# Patient Record
Sex: Female | Born: 1955
Health system: Southern US, Community
[De-identification: ages and names within clinical notes are randomized; demographics above are authoritative.]

## PROBLEM LIST (undated history)

## (undated) DIAGNOSIS — M7660 Achilles tendinitis, unspecified leg: Secondary | ICD-10-CM

## (undated) DIAGNOSIS — I10 Essential (primary) hypertension: Secondary | ICD-10-CM

## (undated) DIAGNOSIS — J019 Acute sinusitis, unspecified: Secondary | ICD-10-CM

## (undated) DIAGNOSIS — M7710 Lateral epicondylitis, unspecified elbow: Secondary | ICD-10-CM

## (undated) DIAGNOSIS — E0590 Thyrotoxicosis, unspecified without thyrotoxic crisis or storm: Secondary | ICD-10-CM

## (undated) DIAGNOSIS — E739 Lactose intolerance, unspecified: Secondary | ICD-10-CM

## (undated) DIAGNOSIS — D219 Benign neoplasm of connective and other soft tissue, unspecified: Secondary | ICD-10-CM

## (undated) DIAGNOSIS — E119 Type 2 diabetes mellitus without complications: Secondary | ICD-10-CM

## (undated) DIAGNOSIS — E785 Hyperlipidemia, unspecified: Secondary | ICD-10-CM

## (undated) DIAGNOSIS — K219 Gastro-esophageal reflux disease without esophagitis: Secondary | ICD-10-CM

## (undated) DIAGNOSIS — F17200 Nicotine dependence, unspecified, uncomplicated: Secondary | ICD-10-CM

## (undated) DIAGNOSIS — N6001 Solitary cyst of right breast: Secondary | ICD-10-CM

## (undated) DIAGNOSIS — M25529 Pain in unspecified elbow: Secondary | ICD-10-CM

## (undated) DIAGNOSIS — G5600 Carpal tunnel syndrome, unspecified upper limb: Secondary | ICD-10-CM

## (undated) DIAGNOSIS — J309 Allergic rhinitis, unspecified: Secondary | ICD-10-CM

## (undated) DIAGNOSIS — Z8742 Personal history of other diseases of the female genital tract: Secondary | ICD-10-CM

## (undated) DIAGNOSIS — J302 Other seasonal allergic rhinitis: Secondary | ICD-10-CM

## (undated) HISTORY — DX: Solitary cyst of right breast: N60.01

## (undated) HISTORY — DX: Type 2 diabetes mellitus without complications: E11.9

## (undated) HISTORY — DX: Nicotine dependence, unspecified, uncomplicated: F17.200

## (undated) HISTORY — DX: Acute sinusitis, unspecified: J01.90

## (undated) HISTORY — DX: Lateral epicondylitis, unspecified elbow: M77.10

## (undated) HISTORY — DX: Achilles tendinitis, unspecified leg: M76.60

## (undated) HISTORY — PX: TONSILLECTOMY: SUR1361

## (undated) HISTORY — DX: Lactose intolerance, unspecified: E73.9

## (undated) HISTORY — DX: Allergic rhinitis, unspecified: J30.9

## (undated) HISTORY — DX: Benign neoplasm of connective and other soft tissue, unspecified: D21.9

## (undated) HISTORY — DX: Gastro-esophageal reflux disease without esophagitis: K21.9

## (undated) HISTORY — DX: Other seasonal allergic rhinitis: J30.2

## (undated) HISTORY — DX: Carpal tunnel syndrome, unspecified upper limb: G56.00

## (undated) HISTORY — DX: Pain in unspecified elbow: M25.529

## (undated) HISTORY — DX: Personal history of other diseases of the female genital tract: Z87.42

## (undated) HISTORY — DX: Hyperlipidemia, unspecified: E78.5

## (undated) HISTORY — DX: Thyrotoxicosis, unspecified without thyrotoxic crisis or storm: E05.90

## (undated) HISTORY — DX: Essential (primary) hypertension: I10

---

## 2000-01-09 ENCOUNTER — Other Ambulatory Visit: Admission: RE | Admit: 2000-01-09 | Discharge: 2000-01-09 | Payer: Self-pay | Admitting: *Deleted

## 2000-01-23 ENCOUNTER — Ambulatory Visit (HOSPITAL_COMMUNITY): Admission: RE | Admit: 2000-01-23 | Discharge: 2000-01-23 | Payer: Self-pay | Admitting: Obstetrics & Gynecology

## 2000-01-23 ENCOUNTER — Encounter: Payer: Self-pay | Admitting: Obstetrics & Gynecology

## 2000-05-15 ENCOUNTER — Other Ambulatory Visit: Admission: RE | Admit: 2000-05-15 | Discharge: 2000-05-15 | Payer: Self-pay | Admitting: Obstetrics and Gynecology

## 2000-06-21 ENCOUNTER — Emergency Department (HOSPITAL_COMMUNITY): Admission: EM | Admit: 2000-06-21 | Discharge: 2000-06-21 | Payer: Self-pay

## 2001-02-23 ENCOUNTER — Other Ambulatory Visit: Admission: RE | Admit: 2001-02-23 | Discharge: 2001-02-23 | Payer: Self-pay | Admitting: Obstetrics and Gynecology

## 2001-03-02 ENCOUNTER — Ambulatory Visit (HOSPITAL_COMMUNITY): Admission: RE | Admit: 2001-03-02 | Discharge: 2001-03-02 | Payer: Self-pay | Admitting: Obstetrics and Gynecology

## 2001-03-02 ENCOUNTER — Encounter: Payer: Self-pay | Admitting: Obstetrics and Gynecology

## 2002-08-12 ENCOUNTER — Other Ambulatory Visit: Admission: RE | Admit: 2002-08-12 | Discharge: 2002-08-12 | Payer: Self-pay | Admitting: *Deleted

## 2002-08-20 ENCOUNTER — Ambulatory Visit (HOSPITAL_COMMUNITY): Admission: RE | Admit: 2002-08-20 | Discharge: 2002-08-20 | Payer: Self-pay | Admitting: Obstetrics and Gynecology

## 2002-08-20 ENCOUNTER — Encounter: Payer: Self-pay | Admitting: Obstetrics and Gynecology

## 2004-05-01 ENCOUNTER — Ambulatory Visit: Payer: Self-pay | Admitting: Internal Medicine

## 2005-01-08 ENCOUNTER — Ambulatory Visit: Payer: Self-pay | Admitting: Internal Medicine

## 2005-01-15 ENCOUNTER — Ambulatory Visit: Payer: Self-pay | Admitting: Internal Medicine

## 2005-06-11 ENCOUNTER — Other Ambulatory Visit: Admission: RE | Admit: 2005-06-11 | Discharge: 2005-06-11 | Payer: Self-pay | Admitting: Obstetrics and Gynecology

## 2005-06-28 ENCOUNTER — Ambulatory Visit (HOSPITAL_COMMUNITY): Admission: RE | Admit: 2005-06-28 | Discharge: 2005-06-28 | Payer: Self-pay | Admitting: Obstetrics and Gynecology

## 2006-04-17 ENCOUNTER — Ambulatory Visit: Payer: Self-pay | Admitting: Internal Medicine

## 2006-04-17 LAB — CONVERTED CEMR LAB
ALT: 24 units/L (ref 0–40)
AST: 20 units/L (ref 0–37)
Albumin: 3.8 g/dL (ref 3.5–5.2)
BUN: 11 mg/dL (ref 6–23)
CO2: 30 meq/L (ref 19–32)
Calcium: 9.3 mg/dL (ref 8.4–10.5)
Chloride: 106 meq/L (ref 96–112)
Chol/HDL Ratio, serum: 3.2
Cholesterol: 180 mg/dL (ref 0–200)
Creatinine, Ser: 0.9 mg/dL (ref 0.4–1.2)
Glomerular Filtration Rate, Af Am: 85 mL/min/{1.73_m2}
HCT: 42.6 % (ref 36.0–46.0)
HDL: 55.7 mg/dL (ref >39.0)
Hemoglobin: 14.2 g/dL (ref 12.0–15.0)
Ketones, ur: NEGATIVE mg/dL
LDL Cholesterol: 109 mg/dL — ABNORMAL HIGH (ref 0–99)
Leukocytes, UA: NEGATIVE
MCV: 92 fL (ref 78.0–100.0)
Neutro Abs: 7.2 10*3/uL (ref 1.4–7.7)
Neutrophils Relative %: 67.8 % (ref 43.0–77.0)
Nitrite: NEGATIVE
Platelets: 278 10*3/uL (ref 150–400)
Potassium: 3.8 meq/L (ref 3.5–5.1)
RDW: 14.6 % (ref 11.5–14.6)
Specific Gravity, Urine: 1.025 (ref 1.000–1.03)
TSH: 0.39 microintl units/mL (ref 0.35–5.50)
Triglyceride fasting, serum: 78 mg/dL (ref 0–149)
Urine Glucose: NEGATIVE mg/dL
VLDL: 16 mg/dL (ref 0–40)
WBC: 10.6 10*3/uL — ABNORMAL HIGH (ref 4.5–10.5)

## 2007-05-08 ENCOUNTER — Telehealth: Payer: Self-pay | Admitting: Internal Medicine

## 2007-05-11 LAB — HM MAMMOGRAPHY: HM Mammogram: NORMAL

## 2007-05-11 LAB — CONVERTED CEMR LAB: Pap Smear: NORMAL

## 2007-05-19 ENCOUNTER — Ambulatory Visit: Payer: Self-pay | Admitting: Internal Medicine

## 2007-05-19 DIAGNOSIS — G5600 Carpal tunnel syndrome, unspecified upper limb: Secondary | ICD-10-CM

## 2007-05-19 DIAGNOSIS — F17200 Nicotine dependence, unspecified, uncomplicated: Secondary | ICD-10-CM

## 2007-05-19 DIAGNOSIS — E785 Hyperlipidemia, unspecified: Secondary | ICD-10-CM | POA: Insufficient documentation

## 2007-05-19 DIAGNOSIS — J309 Allergic rhinitis, unspecified: Secondary | ICD-10-CM | POA: Insufficient documentation

## 2007-05-19 DIAGNOSIS — I10 Essential (primary) hypertension: Secondary | ICD-10-CM

## 2007-05-19 DIAGNOSIS — G5603 Carpal tunnel syndrome, bilateral upper limbs: Secondary | ICD-10-CM | POA: Insufficient documentation

## 2007-05-19 DIAGNOSIS — Z87891 Personal history of nicotine dependence: Secondary | ICD-10-CM | POA: Insufficient documentation

## 2007-05-19 HISTORY — DX: Carpal tunnel syndrome, unspecified upper limb: G56.00

## 2007-05-19 HISTORY — DX: Nicotine dependence, unspecified, uncomplicated: F17.200

## 2007-05-19 HISTORY — DX: Hyperlipidemia, unspecified: E78.5

## 2007-05-19 HISTORY — DX: Essential (primary) hypertension: I10

## 2007-05-19 HISTORY — DX: Allergic rhinitis, unspecified: J30.9

## 2007-05-20 LAB — CONVERTED CEMR LAB
ALT: 25 units/L (ref 0–35)
AST: 25 units/L (ref 0–37)
Alkaline Phosphatase: 64 units/L (ref 39–117)
BUN: 13 mg/dL (ref 6–23)
Basophils Absolute: 0.1 10*3/uL (ref 0.0–0.1)
Bilirubin, Direct: 0.3 mg/dL (ref 0.0–0.3)
CO2: 26 meq/L (ref 19–32)
Calcium: 9.5 mg/dL (ref 8.4–10.5)
Cholesterol: 236 mg/dL (ref 0–200)
Direct LDL: 153.3 mg/dL
Eosinophils Relative: 1.8 % (ref 0.0–5.0)
GFR calc non Af Amer: 80 mL/min
Glucose, Bld: 86 mg/dL (ref 70–99)
Hemoglobin: 13.9 g/dL (ref 12.0–15.0)
Ketones, ur: NEGATIVE mg/dL
Neutrophils Relative %: 64.1 % (ref 43.0–77.0)
Potassium: 4.3 meq/L (ref 3.5–5.1)
RBC: 4.43 M/uL (ref 3.87–5.11)
RDW: 13.8 % (ref 11.5–14.6)
Specific Gravity, Urine: 1.025 (ref 1.000–1.03)
Total Protein, Urine: NEGATIVE mg/dL
Urine Glucose: NEGATIVE mg/dL
VLDL: 21 mg/dL (ref 0–40)
WBC: 8.9 10*3/uL (ref 4.5–10.5)

## 2007-06-05 ENCOUNTER — Ambulatory Visit (HOSPITAL_COMMUNITY): Admission: RE | Admit: 2007-06-05 | Discharge: 2007-06-05 | Payer: Self-pay | Admitting: Obstetrics and Gynecology

## 2007-06-19 ENCOUNTER — Encounter: Admission: RE | Admit: 2007-06-19 | Discharge: 2007-06-19 | Payer: Self-pay | Admitting: Obstetrics and Gynecology

## 2008-04-21 ENCOUNTER — Ambulatory Visit: Payer: Self-pay | Admitting: Internal Medicine

## 2008-04-21 ENCOUNTER — Encounter: Payer: Self-pay | Admitting: Family Medicine

## 2008-04-21 DIAGNOSIS — M25529 Pain in unspecified elbow: Secondary | ICD-10-CM

## 2008-04-21 DIAGNOSIS — M7710 Lateral epicondylitis, unspecified elbow: Secondary | ICD-10-CM | POA: Insufficient documentation

## 2008-04-21 HISTORY — DX: Lateral epicondylitis, unspecified elbow: M77.10

## 2008-04-21 HISTORY — DX: Pain in unspecified elbow: M25.529

## 2008-05-09 ENCOUNTER — Encounter: Payer: Self-pay | Admitting: Internal Medicine

## 2008-05-09 ENCOUNTER — Telehealth: Payer: Self-pay | Admitting: Internal Medicine

## 2009-02-10 ENCOUNTER — Ambulatory Visit: Payer: Self-pay | Admitting: Internal Medicine

## 2009-02-10 LAB — CONVERTED CEMR LAB
ALT: 20 units/L (ref 0–35)
Alkaline Phosphatase: 74 units/L (ref 39–117)
Basophils Absolute: 0.4 10*3/uL — ABNORMAL HIGH (ref 0.0–0.1)
Bilirubin, Direct: 0 mg/dL (ref 0.0–0.3)
CO2: 27 meq/L (ref 19–32)
Chloride: 108 meq/L (ref 96–112)
Cholesterol: 197 mg/dL (ref 0–200)
Eosinophils Absolute: 0.2 10*3/uL (ref 0.0–0.7)
Eosinophils Relative: 2.4 % (ref 0.0–5.0)
Ketones, ur: NEGATIVE mg/dL
MCHC: 34.7 g/dL (ref 30.0–36.0)
Monocytes Relative: 3.2 % (ref 3.0–12.0)
Neutrophils Relative %: 66.4 % (ref 43.0–77.0)
Nitrite: NEGATIVE
Potassium: 3.7 meq/L (ref 3.5–5.1)
RDW: 15.3 % — ABNORMAL HIGH (ref 11.5–14.6)
Sodium: 141 meq/L (ref 135–145)
Specific Gravity, Urine: 1.025 (ref 1.000–1.030)
Total Bilirubin: 0.6 mg/dL (ref 0.3–1.2)
Total CHOL/HDL Ratio: 4
Total Protein, Urine: NEGATIVE mg/dL
VLDL: 14.8 mg/dL (ref 0.0–40.0)
WBC: 8 10*3/uL (ref 4.5–10.5)

## 2009-02-14 ENCOUNTER — Ambulatory Visit: Payer: Self-pay | Admitting: Internal Medicine

## 2009-02-14 DIAGNOSIS — E739 Lactose intolerance, unspecified: Secondary | ICD-10-CM

## 2009-02-14 HISTORY — DX: Lactose intolerance, unspecified: E73.9

## 2009-02-14 LAB — CONVERTED CEMR LAB: Hgb A1c MFr Bld: 5.8 % (ref 4.6–6.5)

## 2009-02-16 LAB — CONVERTED CEMR LAB
Hgb A1c MFr Bld: 5.8 % (ref 4.6–6.5)
TSH: 0.1 microintl units/mL — ABNORMAL LOW (ref 0.35–5.50)

## 2009-02-21 ENCOUNTER — Telehealth (INDEPENDENT_AMBULATORY_CARE_PROVIDER_SITE_OTHER): Payer: Self-pay | Admitting: *Deleted

## 2009-03-10 ENCOUNTER — Ambulatory Visit: Payer: Self-pay | Admitting: Endocrinology

## 2009-03-10 DIAGNOSIS — E0590 Thyrotoxicosis, unspecified without thyrotoxic crisis or storm: Secondary | ICD-10-CM | POA: Insufficient documentation

## 2009-03-10 HISTORY — DX: Thyrotoxicosis, unspecified without thyrotoxic crisis or storm: E05.90

## 2009-03-21 ENCOUNTER — Encounter (HOSPITAL_COMMUNITY): Admission: RE | Admit: 2009-03-21 | Discharge: 2009-03-22 | Payer: Self-pay | Admitting: Endocrinology

## 2009-06-19 ENCOUNTER — Encounter (INDEPENDENT_AMBULATORY_CARE_PROVIDER_SITE_OTHER): Payer: Self-pay | Admitting: *Deleted

## 2009-10-06 ENCOUNTER — Ambulatory Visit: Payer: Self-pay | Admitting: Internal Medicine

## 2009-10-06 ENCOUNTER — Ambulatory Visit: Payer: Self-pay | Admitting: Endocrinology

## 2009-10-06 DIAGNOSIS — M7660 Achilles tendinitis, unspecified leg: Secondary | ICD-10-CM

## 2009-10-06 HISTORY — DX: Achilles tendinitis, unspecified leg: M76.60

## 2009-10-06 LAB — CONVERTED CEMR LAB: Free T4: 0.7 ng/dL (ref 0.6–1.6)

## 2010-03-08 ENCOUNTER — Telehealth: Payer: Self-pay | Admitting: Internal Medicine

## 2010-04-27 ENCOUNTER — Encounter: Admission: RE | Admit: 2010-04-27 | Discharge: 2010-04-27 | Payer: Self-pay | Admitting: Obstetrics and Gynecology

## 2010-05-07 ENCOUNTER — Ambulatory Visit: Payer: Self-pay | Admitting: Internal Medicine

## 2010-05-07 LAB — CONVERTED CEMR LAB
Alkaline Phosphatase: 78 units/L (ref 39–117)
Basophils Absolute: 0 10*3/uL (ref 0.0–0.1)
Bilirubin Urine: NEGATIVE
Chloride: 109 meq/L (ref 96–112)
Cholesterol: 197 mg/dL (ref 0–200)
Eosinophils Absolute: 0.1 10*3/uL (ref 0.0–0.7)
GFR calc non Af Amer: 97.23 mL/min (ref >60)
Glucose, Bld: 96 mg/dL (ref 70–99)
HCT: 40 % (ref 36.0–46.0)
Ketones, ur: NEGATIVE mg/dL
Leukocytes, UA: NEGATIVE
Lymphs Abs: 2.7 10*3/uL (ref 0.7–4.0)
MCV: 91.3 fL (ref 78.0–100.0)
Platelets: 236 10*3/uL (ref 150.0–400.0)
RBC: 4.39 M/uL (ref 3.87–5.11)
Sodium: 144 meq/L (ref 135–145)
Specific Gravity, Urine: 1.02 (ref 1.000–1.030)
Total Protein, Urine: NEGATIVE mg/dL
Urobilinogen, UA: 0.2 (ref 0.0–1.0)
WBC: 10 10*3/uL (ref 4.5–10.5)
pH: 6 (ref 5.0–8.0)

## 2010-05-11 ENCOUNTER — Encounter: Admission: RE | Admit: 2010-05-11 | Discharge: 2010-05-11 | Payer: Self-pay | Admitting: Obstetrics and Gynecology

## 2010-05-11 ENCOUNTER — Ambulatory Visit: Payer: Self-pay | Admitting: Internal Medicine

## 2010-05-11 ENCOUNTER — Encounter: Payer: Self-pay | Admitting: Internal Medicine

## 2010-05-11 DIAGNOSIS — J019 Acute sinusitis, unspecified: Secondary | ICD-10-CM

## 2010-05-11 HISTORY — DX: Acute sinusitis, unspecified: J01.90

## 2010-05-16 ENCOUNTER — Encounter (INDEPENDENT_AMBULATORY_CARE_PROVIDER_SITE_OTHER): Payer: Self-pay | Admitting: *Deleted

## 2010-06-01 ENCOUNTER — Encounter (INDEPENDENT_AMBULATORY_CARE_PROVIDER_SITE_OTHER): Payer: Self-pay | Admitting: *Deleted

## 2010-06-06 ENCOUNTER — Ambulatory Visit: Payer: Self-pay | Admitting: Internal Medicine

## 2010-06-10 HISTORY — PX: COLONOSCOPY: SHX174

## 2010-06-12 ENCOUNTER — Telehealth: Payer: Self-pay | Admitting: Internal Medicine

## 2010-07-01 ENCOUNTER — Encounter: Payer: Self-pay | Admitting: Obstetrics and Gynecology

## 2010-07-06 ENCOUNTER — Ambulatory Visit
Admission: RE | Admit: 2010-07-06 | Discharge: 2010-07-06 | Payer: Self-pay | Source: Home / Self Care | Attending: Internal Medicine | Admitting: Internal Medicine

## 2010-07-06 ENCOUNTER — Encounter: Payer: Self-pay | Admitting: Internal Medicine

## 2010-07-10 NOTE — Assessment & Plan Note (Signed)
Summary: CPX/ NWS  #   Vital Signs:  Patient profile:   55 year old female Height:      63 inches Weight:      177 pounds BMI:     31.47 O2 Sat:      97 % on Room air Temp:     99.1 degrees F oral Pulse rate:   85 / minute BP sitting:   130 / 88  (left arm) Cuff size:   regular  Vitals Entered By: Zella Ball Ewing CMA Duncan Dull) (May 11, 2010 1:13 PM)  O2 Flow:  Room air  Preventive Care Screening     had the flu shot this season already in october , had pap and mamogram earlier today  CC: Adult Physical/RE   Primary Care Provider:  Corwin Levins MD  CC:  Adult Physical/RE.  History of Present Illness: here for wellness and f/u;  overall doing well;  Pt denies CP, worsening sob, doe, wheezing, orthopnea, pnd, worsening LE edema, palps, dizziness or syncope    Pt denies polydipsia, polyuria  Overall good compliance with meds, trying to follow low chol  diet, wt stable, little excercise however.  Pt denies new neuro symptoms such as headache, facial or extremity weakness  No fever, wt loss, night sweats, loss of appetite or other constitutional symptoms  Denies worsening depressive symptoms, suicidal ideation, or panic.   Pt states good ability with ADL's, low fall risk, home safety reviewed and adequate, no significant change in hearing or vision, trying to follow lower chol diet, and occasionally active only with regular excercise.   Overall good compliance with meds, and good tolerability but Currently not taking the diuretic - BP at home < 140/90 .  Does have 3 days acute onset mild to mod facial pain, pressure and greenish d/c.    Preventive Screening-Counseling & Management      Drug Use:  no.    Problems Prior to Update: 1)  Achilles Tendinitis  (ICD-726.71) 2)  Hyperthyroidism  (ICD-242.90) 3)  Lateral Epicondylitis, Left  (ICD-726.32) 4)  Glucose Intolerance  (ICD-271.3) 5)  Elbow Pain, Right  (ICD-719.42) 6)  Lateral Epicondylitis, Right  (ICD-726.32) 7)  Smoker   (ICD-305.1) 8)  Preventive Health Care  (ICD-V70.0) 9)  Carpal Tunnel Syndrome, Bilateral  (ICD-354.0) 10)  Allergic Rhinitis  (ICD-477.9) 11)  Hypertension  (ICD-401.9) 12)  Hyperlipidemia  (ICD-272.4)  Medications Prior to Update: 1)  Simvastatin 20 Mg Tabs (Simvastatin) .Marland Kitchen.. 1po Once Daily 2)  Ecotrin Low Strength 81 Mg  Tbec (Aspirin) .Marland Kitchen.. 1 By Mouth Qd 3)  Naproxen 500 Mg Tabs (Naproxen) .Marland Kitchen.. 1po Two Times A Day As Needed Pain 4)  Chantix Continuing Month Pak 1 Mg Tabs (Varenicline Tartrate) .... Use Asd 1 By Mouth Once Daily 5)  Cartia Xt 240 Mg Xr24h-Cap (Diltiazem Hcl Coated Beads) .Marland Kitchen.. 1 Qd 6)  Triamterene-Hctz 37.5-25 Mg Caps (Triamterene-Hctz) .... 1/2 Qd  Current Medications (verified): 1)  Simvastatin 20 Mg Tabs (Simvastatin) .Marland Kitchen.. 1po Once Daily 2)  Ecotrin Low Strength 81 Mg  Tbec (Aspirin) .Marland Kitchen.. 1 By Mouth Qd 3)  Naproxen 500 Mg Tabs (Naproxen) .Marland Kitchen.. 1po Two Times A Day As Needed Pain 4)  Chantix Continuing Month Pak 1 Mg Tabs (Varenicline Tartrate) .... Use Asd 1 By Mouth Once Daily 5)  Cartia Xt 240 Mg Xr24h-Cap (Diltiazem Hcl Coated Beads) .Marland Kitchen.. 1po Once Daily 6)  Azithromycin 250 Mg Tabs (Azithromycin) .... 2po Qd For 1 Day, Then 1po Qd For 4days, Then  Stop  Allergies (verified): 1)  ! Sulfa  Past History:  Past Medical History: Last updated: 05/19/2007 Hyperlipidemia Hypertension Allergic rhinitis bilat cts  Past Surgical History: Last updated: 05/19/2007 Tonsillectomy s/p c-section  Family History: Last updated: 01-Apr-2009 father with DM mother died with DM, HTN great uncle wtih colon cancer no thyroid dz or goiter  Social History: Last updated: 05/11/2010 Current Smoker Alcohol use-no works [employer] [place] [employer]  married Drug use-no  Risk Factors: Smoking Status: current (05/19/2007)  Social History: Current Smoker Alcohol use-no works [employer] [place] [employer]  married Drug use-no Drug Use:  no  Review of Systems  The patient denies anorexia,  fever, vision loss, decreased hearing, hoarseness, chest pain, syncope, dyspnea on exertion, peripheral edema, prolonged cough, headaches, hemoptysis, abdominal pain, melena, hematochezia, severe indigestion/heartburn, hematuria, muscle weakness, suspicious skin lesions, transient blindness, difficulty walking, depression, unusual weight change, abnormal bleeding, enlarged lymph nodes, and angioedema.         all otherwise negative per pt -    Physical Exam  General:  alert and overweight-appearing.   Head:  normocephalic and atraumatic.   Eyes:  vision grossly intact, pupils equal, and pupils round.   Ears:  R ear normal and L ear normal.  , sinus tender bilat Nose:  nasal dischargemucosal pallor and mucosal edema.   Mouth:  pharyngeal erythema and fair dentition.   Neck:  supple and no masses.   Lungs:  normal respiratory effort and normal breath sounds.   Heart:  normal rate and regular rhythm.   Abdomen:  soft, non-tender, and normal bowel sounds.   Msk:  no joint tenderness and no joint swelling.   Extremities:  no edema, no erythema  Neurologic:  strength normal to upper extremities and sensation intact to light touch.   Skin:  color normal and no rashes.   Psych:  not anxious appearing and not depressed appearing.     Impression & Recommendations:  Problem # 1:  Preventive Health Care (ICD-V70.0) Assessment Improved Overall doing well, age appropriate education and counseling updated, referral for preventive services and immunizations addressed, dietary counseling and smoking status adressed , most recent labs reviewed, ecg reviewed I have personally reviewed and have noted 1.The patient's medical and social history 2.Their use of alcohol, tobacco or illicit drugs 3.Their current medications and supplements 4. Functional ability including ADL's, fall risk, home safety risk, hearing & visual impairment  5.Diet and physical activities 6.Evidence for depression or mood  disorders The patients weight, height, BMI  have been recorded in the chart I have made referrals, counseling and provided education to the patient based review of the above  Orders: EKG w/ Interpretation (93000) Gastroenterology Referral (GI)  Problem # 2:  HYPERTHYROIDISM (ICD-242.90) with slight low TSH today - will do f/u TSH in 6 months  Problem # 3:  HYPERLIPIDEMIA (ICD-272.4)  Her updated medication list for this problem includes:    Simvastatin 20 Mg Tabs (Simvastatin) .Marland Kitchen... 1po once daily  Labs Reviewed: SGOT: 21 (05/07/2010)   SGPT: 23 (05/07/2010)   HDL:57.40 (05/07/2010), 49.30 (02/10/2009)  LDL:115 (05/07/2010), 133 (02/10/2009)  Chol:197 (05/07/2010), 197 (02/10/2009)  Trig:121.0 (05/07/2010), 74.0 (02/10/2009) to change to lipitor for better control - goal ldl < 100  Problem # 4:  HYPERTENSION (ICD-401.9)  The following medications were removed from the medication list:    Triamterene-hctz 37.5-25 Mg Caps (Triamterene-hctz) .Marland Kitchen... 1/2 qd Her updated medication list for this problem includes:    Cartia Xt 240 Mg Xr24h-cap (Diltiazem hcl coated beads) .Marland KitchenMarland KitchenMarland KitchenMarland Kitchen  1po once daily  BP today: 130/88 Prior BP: 120/78 (10/06/2009)  Labs Reviewed: K+: 4.2 (05/07/2010) Creat: : 0.8 (05/07/2010)   Chol: 197 (05/07/2010)   HDL: 57.40 (05/07/2010)   LDL: 115 (05/07/2010)   TG: 121.0 (05/07/2010) stable overall by hx and exam, ok to continue meds/tx as is   Problem # 5:  SINUSITIS- ACUTE-NOS (ICD-461.9)  Her updated medication list for this problem includes:    Azithromycin 250 Mg Tabs (Azithromycin) .Marland Kitchen... 2po qd for 1 day, then 1po qd for 4days, then stop treat as above, f/u any worsening signs or symptoms   Complete Medication List: 1)  Simvastatin 20 Mg Tabs (Simvastatin) .Marland Kitchen.. 1po once daily 2)  Ecotrin Low Strength 81 Mg Tbec (Aspirin) .Marland Kitchen.. 1 by mouth qd 3)  Naproxen 500 Mg Tabs (Naproxen) .Marland Kitchen.. 1po two times a day as needed pain 4)  Chantix Continuing Month Pak 1 Mg Tabs  (Varenicline tartrate) .... Use asd 1 by mouth once daily 5)  Cartia Xt 240 Mg Xr24h-cap (Diltiazem hcl coated beads) .Marland Kitchen.. 1po once daily 6)  Azithromycin 250 Mg Tabs (Azithromycin) .... 2po qd for 1 day, then 1po qd for 4days, then stop  Patient Instructions: 1)  Please schedule a follow-up appointment in 6 months with : 2)  TSH prior to visit, ICD-9:  242.90 3)  Lipids:  272.0 4)  Hepatic funciton panel:  v58.69 5)  stop the simvastatin  6)  start the generic lipitor 20 mg per day 7)  You will be contacted about the referral(s) to: colonoscopy 8)  Please take all new medications as prescribed - the antibiotic 9)  You can also use Mucinex OTC or it's generic for congestion and ear fullness 10)  Continue all previous medications as before this visit  11)  Please schedule a follow-up appointment in 1 year, or sooner if needed Prescriptions: AZITHROMYCIN 250 MG TABS (AZITHROMYCIN) 2po qd for 1 day, then 1po qd for 4days, then stop  #6 x 1   Entered and Authorized by:   Corwin Levins MD   Signed by:   Corwin Levins MD on 05/11/2010   Method used:   [facility]        [facility] [address] [phone]* (retail)       [address]       [place], [place]  [address]       Ph: [phone] or [phone]       Fax: [phone]   RxID:   [id number] CHANTIX CONTINUING MONTH PAK 1 MG TABS (VARENICLINE TARTRATE) use asd 1 by mouth once daily  #1pk x 1   Entered and Authorized by:   Corwin Levins MD   Signed by:   Corwin Levins MD on 05/11/2010   Method used:   [facility]        [facility] [address] [phone]* (retail)       [address]       [place], [place]  [address]       Ph: [phone] or [phone]       Fax: [phone]   RxID:   [id number] CARTIA XT 240 MG XR24H-CAP (DILTIAZEM HCL COATED BEADS) 1po once daily  #90 x 3   Entered and Authorized by:   Corwin Levins MD   Signed by:   Corwin Levins MD on 05/11/2010   Method  used:  [facility] to        [facility] [address] [phone]* (retail)       [address]       [place], [place]  [address]       Ph: [phone] or [phone]       Fax: [phone]   RxID:   [id number]    Orders Added: 1)  EKG w/ Interpretation [93000] 2)  Gastroenterology Referral [GI] 3)  Est. Patient 40-64 years [phone]

## 2010-07-10 NOTE — Progress Notes (Signed)
Summary: medication Change?  Phone Note From Pharmacy   [facility] [address] [phone]* Summary of Call: Note from Pharmacy, would you like to continue pt. on Simvastatin 80mg  or decrease dose as may be interaction between Simvastatin and Diltiazem? Initial call taken by: Robin Ewing CMA Duncan Dull),  March 08, 2010 8:58 AM  Follow-up for Phone Call        due to recent FDA recommendations, we'll need to reduce to 20 mg simvastatin;    we may need to eventually try a new statin such as lipitor after it goes generic nov 2011 Follow-up by: Corwin Levins MD,  March 08, 2010 9:04 AM  Additional Follow-up for Phone Call Additional follow up Details #1::        called pt left msg. to call back Additional Follow-up by: Robin Ewing CMA Duncan Dull),  March 08, 2010 10:52 AM    Additional Follow-up for Phone Call Additional follow up Details #2::    called pt left msg. to call back Follow-up by: Robin Ewing CMA Duncan Dull),  March 08, 2010 2:33 PM  Additional Follow-up for Phone Call Additional follow up Details #3:: Details for Additional Follow-up Action Taken: called pt left msg. to call back   Called pt left msg. of above information about Change in Simvastatin and prescription sent to her pharmacy.  Robin Ewing CMA Duncan Dull)  March 09, 2010 8:14 AM  Additional Follow-up by: Zella Ball Ewing CMA Duncan Dull),  March 09, 2010 7:51 AM  New/Updated Medications: SIMVASTATIN 20 MG TABS (SIMVASTATIN) 1po once daily Prescriptions: SIMVASTATIN 20 MG TABS (SIMVASTATIN) 1po once daily  #90 x 3   Entered and Authorized by:   Corwin Levins MD   Signed by:   Corwin Levins MD on 03/08/2010   Method used:   Electronically to        [facility] [address] [phone]* (retail)       [address]       [place], [place]  [address]       Ph: [phone] or [phone]       Fax: [phone]   RxID:   [id number]

## 2010-07-10 NOTE — Letter (Signed)
Summary: Pre Visit Letter Revised  [facility]  [address] [place], [place] [address]   Phone: [phone]  Fax: [phone]        05/16/2010 MRN: [id number] [place] [address] [place], [place]  [address]             Procedure Date:  07-06-10   Welcome to the Gastroenterology Division at [facility].    You are scheduled to see a nurse for your pre-procedure visit on 06-06-10 at 1:30P.M. on the 3rd floor at [facility], [address]  We ask that you try to arrive at our office 15 minutes prior to your appointment time to allow for check-in.  Please take a minute to review the attached form.  If you answer "Yes" to one or more of the questions on the first page, we ask that you call the person listed at your earliest opportunity.  If you answer "No" to all of the questions, please complete the rest of the form and bring it to your appointment.    Your nurse visit will consist of discussing your medical and surgical history, your immediate family medical history, and your medications.   If you are unable to list all of your medications on the form, please bring the medication bottles to your appointment and we will list them.  We will need to be aware of both prescribed and over the counter drugs.  We will need to know exact dosage information as well.    Please be prepared to read and sign documents such as consent forms, a financial agreement, and acknowledgement forms.  If necessary, and with your consent, a friend or relative is welcome to sit-in on the nurse visit with you.  Please bring your insurance card so that we may make a copy of it.  If your insurance requires a referral to see a specialist, please bring your referral form from your primary care physician.  No co-pay is required for this nurse visit.     If you cannot keep your appointment, please call [phone] to cancel or reschedule prior to your appointment date.  This  allows [place] the opportunity to schedule an appointment for another patient in need of care.    Thank you for choosing [facility] for your medical needs.  We appreciate the opportunity to care for you.  Please visit [place] at our website  to learn more about our practice.  Sincerely, The Gastroenterology Division

## 2010-07-10 NOTE — Letter (Signed)
Summary: [facility] Referral (unable to schedule) Notification  [facility]  [address] [place], [place] [address]   Phone: [phone]  Fax: [phone]      June 19, 2009 Erica Mills 10-29-1955 MRN: [id number]   [place] [address] [place], [place]  [address]   Dear Dr. Jonny Ruiz:   Thank you for your kind referral of the above patient. We have attempted to schedule the recommended Colonoscopy but have been unable to schedule because:  _x_ The patient was not available by phone and/or has not returned our calls.  __ The patient declined to schedule the procedure at this time.  We appreciate the referral and hope that we will have the opportunity to treat this patient in the future.    Sincerely,   [facility]  Vania Rea. Jarold Motto M.D. Hedwig Morton. Juanda Chance M.D. Venita Lick. Russella Dar M.D. Wilhemina Bonito. Marina Goodell M.D. Barbette Hair. Arlyce Dice M.D. Iva Boop M.D. Cheron Every.D.

## 2010-07-10 NOTE — Assessment & Plan Note (Signed)
Summary: 6 MO ROV /NWS   Vital Signs:  Patient profile:   55 year old female Height:      63 inches (160.02 cm) Weight:      175.25 pounds (79.66 kg) O2 Sat:      97 % on Room air Temp:     97.8 degrees F (36.56 degrees C) oral Pulse rate:   92 / minute BP sitting:   120 / 78  (left arm) Cuff size:   regular  Vitals Entered By: Josph Macho RMA (October 06, 2009 8:33 AM)  O2 Flow:  Room air CC: 6 month follow up/ CF   Referring Provider:  Corwin Levins md Primary Provider:  Corwin Levins MD  CC:  6 month follow up/ CF.  History of Present Illness: pt was seen for hyperthyroidism 6 mos ago.  scan showed low uptake.  she denies neck pain and fatigue. pt says she has never taken thyroid medication.  Current Medications (verified): 1)  Simvastatin 80 Mg Tabs (Simvastatin) .Marland Kitchen.. 1 By Mouth Once Daily 2)  Ecotrin Low Strength 81 Mg  Tbec (Aspirin) .Marland Kitchen.. 1 By Mouth Qd 3)  Prednisone 10 Mg Tabs (Prednisone) .... 4po Qd For 3days, Then 3po Qd For 3days, Then 2po Qd For 3days, Then 1po Qd For 3 Days, Then Stop 4)  Hydrocodone-Acetaminophen 5-325 Mg Tabs (Hydrocodone-Acetaminophen) .Marland Kitchen.. 1 By Mouth Q 6 Hrs As Needed Paiin 5)  Chantix Starting Month Pak 0.5 Mg X 11 & 1 Mg X 42 Tabs (Varenicline Tartrate) .... Use Asd 1 By Mouth Once Daily 6)  Chantix Continuing Month Pak 1 Mg Tabs (Varenicline Tartrate) .... Use Asd 1 By Mouth Once Daily 7)  Cartia Xt 240 Mg Xr24h-Cap (Diltiazem Hcl Coated Beads) .Marland Kitchen.. 1 Qd 8)  Triamterene-Hctz 37.5-25 Mg Caps (Triamterene-Hctz) .... 1/2 Qd  Allergies (verified): 1)  ! Sulfa  Past History:  Past Medical History: Last updated: 05/19/2007 Hyperlipidemia Hypertension Allergic rhinitis bilat cts  Review of Systems  The patient denies weight gain.    Physical Exam  General:  normal appearance.   Neck:  thyroid is low-lying, so i cannot tell details, but appears to be approx 2x normal size, right > left lobe.  i do not appreciate a discrete  nodule Additional Exam:  FastTSH                   0.48 uIU/mL                 0.35-5.50 Free T4                   0.7 ng/dL    Impression & Recommendations:  Problem # 1:  HYPERTHYROIDISM (ICD-242.90) Assessment Improved ? due to subacute thyroiditis  Other Orders: TLB-TSH (Thyroid Stimulating Hormone) (84443-TSH) TLB-T4 (Thyrox), Free [phone]) Est. Patient Level III (33295)  Patient Instructions: 1)  tests are being ordered for you today.  a few days after the test(s), please call [phone] to hear your test results. 2)  (update: i left message on phone-tree:  see dr Jonny Ruiz for cpx september as scheduled.  tsh will be chacked again then.  if normal, you should have checked annually.  i am happy to see you back if abnormal.)

## 2010-07-10 NOTE — Assessment & Plan Note (Signed)
Summary: 6 MO FU /NWS   Vital Signs:  Patient profile:   55 year old female Height:      63 inches (160.02 cm) Weight:      175.25 pounds (79.66 kg) O2 Sat:      97 % on Room air Temp:     97.8 degrees F (36.56 degrees C) oral Pulse rate:   92 / minute BP sitting:   120 / 78  (left arm) Cuff size:   regular  Vitals Entered By: Josph Macho RMA (October 06, 2009 8:06 AM)  O2 Flow:  Room air CC: 6 month follow up/ CF   Primary Care Provider:  Corwin Levins MD  CC:  6 month follow up/ CF.  History of Present Illness: with her insurance colon prevention is free after may 1 so plans to chedule colonscopy after that time; wants to quit smoking and serious about taking chantix - reqeusts rx;  Pt denies CP, sob, doe, wheezing, orthopnea, pnd, worsening LE edema, palps, dizziness or syncope  Pt denies new neuro symptoms such as headache, facial or extremity weakness   Does c/o left achilles tendon pain, worse to take first steps in the am, and after sitting for 30 min.  Also with incresased right CTS symtpoms as his right wrist as the wrist splint seems to have worn out.    Problems Prior to Update: 1)  Achilles Tendinitis  (ICD-726.71) 2)  Hyperthyroidism  (ICD-242.90) 3)  Lateral Epicondylitis, Left  (ICD-726.32) 4)  Glucose Intolerance  (ICD-271.3) 5)  Elbow Pain, Right  (ICD-719.42) 6)  Lateral Epicondylitis, Right  (ICD-726.32) 7)  Smoker  (ICD-305.1) 8)  Preventive Health Care  (ICD-V70.0) 9)  Carpal Tunnel Syndrome, Bilateral  (ICD-354.0) 10)  Allergic Rhinitis  (ICD-477.9) 11)  Hypertension  (ICD-401.9) 12)  Hyperlipidemia  (ICD-272.4)  Medications Prior to Update: 1)  Simvastatin 80 Mg Tabs (Simvastatin) .Marland Kitchen.. 1 By Mouth Once Daily 2)  Ecotrin Low Strength 81 Mg  Tbec (Aspirin) .Marland Kitchen.. 1 By Mouth Qd 3)  Prednisone 10 Mg Tabs (Prednisone) .... 4po Qd For 3days, Then 3po Qd For 3days, Then 2po Qd For 3days, Then 1po Qd For 3 Days, Then Stop 4)  Hydrocodone-Acetaminophen 5-325  Mg Tabs (Hydrocodone-Acetaminophen) .Marland Kitchen.. 1 By Mouth Q 6 Hrs As Needed Paiin 5)  Chantix Starting Month Pak 0.5 Mg X 11 & 1 Mg X 42 Tabs (Varenicline Tartrate) .... Use Asd 1 By Mouth Once Daily 6)  Chantix Continuing Month Pak 1 Mg Tabs (Varenicline Tartrate) .... Use Asd 1 By Mouth Once Daily 7)  Cartia Xt 240 Mg Xr24h-Cap (Diltiazem Hcl Coated Beads) .Marland Kitchen.. 1 Qd 8)  Triamterene-Hctz 37.5-25 Mg Caps (Triamterene-Hctz) .... 1/2 Qd  Current Medications (verified): 1)  Simvastatin 80 Mg Tabs (Simvastatin) .Marland Kitchen.. 1 By Mouth Once Daily 2)  Ecotrin Low Strength 81 Mg  Tbec (Aspirin) .Marland Kitchen.. 1 By Mouth Qd 3)  Naproxen 500 Mg Tabs (Naproxen) .Marland Kitchen.. 1po Two Times A Day As Needed Pain 4)  Chantix Continuing Month Pak 1 Mg Tabs (Varenicline Tartrate) .... Use Asd 1 By Mouth Once Daily 5)  Cartia Xt 240 Mg Xr24h-Cap (Diltiazem Hcl Coated Beads) .Marland Kitchen.. 1 Qd 6)  Triamterene-Hctz 37.5-25 Mg Caps (Triamterene-Hctz) .... 1/2 Qd  Allergies (verified): 1)  ! Sulfa  Past History:  Past Medical History: Last updated: 05/19/2007 Hyperlipidemia Hypertension Allergic rhinitis bilat cts  Past Surgical History: Last updated: 05/19/2007 Tonsillectomy s/p c-section  Social History: Last updated: 03/10/2009 Current Smoker Alcohol use-no works [employer]  married  Risk Factors: Smoking Status: current (05/19/2007)  Review of Systems       all otherwise negative per pt -    Physical Exam  General:  alert and overweight-appearing.   Head:  normocephalic and atraumatic.   Eyes:  vision grossly intact, pupils equal, and pupils round.   Ears:  R ear normal and L ear normal.   Nose:  no external deformity and no nasal discharge.   Mouth:  no gingival abnormalities and pharynx pink and moist.   Neck:  supple and no masses.   Lungs:  normal respiratory effort and normal breath sounds.   Heart:  normal rate and regular rhythm.   Abdomen:  soft, non-tender, and normal bowel sounds.   Msk:  tender left  achilles insertion site , mild Extremities:  no edema, no erythema  Neurologic:  strength normal to upper extremities and sensation intact to light touch.     Impression & Recommendations:  Problem # 1:  ACHILLES TENDINITIS (ICD-726.71) for nsaid as needed , edeucated, reassured  Problem # 2:  HYPERTENSION (ICD-401.9)  Her updated medication list for this problem includes:    Cartia Xt 240 Mg Xr24h-cap (Diltiazem hcl coated beads) .Marland Kitchen... 1 qd    Triamterene-hctz 37.5-25 Mg Caps (Triamterene-hctz) .Marland Kitchen... 1/2 qd  BP today: 120/78 Prior BP: 158/110 (03/10/2009)  Labs Reviewed: K+: 3.7 (02/10/2009) Creat: : 0.7 (02/10/2009)   Chol: 197 (02/10/2009)   HDL: 49.30 (02/10/2009)   LDL: 133 (02/10/2009)   TG: 74.0 (02/10/2009) improved, stable overall by hx and exam, ok to continue meds/tx as is   Problem # 3:  SMOKER (ICD-305.1)  The following medications were removed from the medication list:    Chantix Starting Month Pak 0.5 Mg X 11 & 1 Mg X 42 Tabs (Varenicline tartrate) ..... Use asd 1 by mouth once daily Her updated medication list for this problem includes:    Chantix Continuing Month Pak 1 Mg Tabs (Varenicline tartrate) ..... Use asd 1 by mouth once daily treat as above, f/u any worsening signs or symptoms   Problem # 4:  CARPAL TUNNEL SYNDROME, BILATERAL (ICD-354.0)  for new right wrist splint today  Orders: Ankle / Wrist Splint (A4570)  Complete Medication List: 1)  Simvastatin 80 Mg Tabs (Simvastatin) .Marland Kitchen.. 1 by mouth once daily 2)  Ecotrin Low Strength 81 Mg Tbec (Aspirin) .Marland Kitchen.. 1 by mouth qd 3)  Naproxen 500 Mg Tabs (Naproxen) .Marland Kitchen.. 1po two times a day as needed pain 4)  Chantix Continuing Month Pak 1 Mg Tabs (Varenicline tartrate) .... Use asd 1 by mouth once daily 5)  Cartia Xt 240 Mg Xr24h-cap (Diltiazem hcl coated beads) .Marland Kitchen.. 1 qd 6)  Triamterene-hctz 37.5-25 Mg Caps (Triamterene-hctz) .... 1/2 qd  Patient Instructions: 1)  you are given the new right wrist splint  today 2)  Please take all new medications as prescribed 3)  Continue all previous medications as before this visit  4)  Please schedule a follow-up appointment in Sept 2011 with CPX labs  Prescriptions: CHANTIX CONTINUING MONTH PAK 1 MG TABS (VARENICLINE TARTRATE) use asd 1 by mouth once daily  #1 x 1   Entered and Authorized by:   Corwin Levins MD   Signed by:   Corwin Levins MD on 10/06/2009   Method used:   Print then Give to Patient   RxID:   [id number] NAPROXEN 500 MG TABS (NAPROXEN) 1po two times a day as needed pain  #60 x 2  Entered and Authorized by:   Corwin Levins MD   Signed by:   Corwin Levins MD on 10/06/2009   Method used:   Print then Give to Patient   RxID:   [id number]

## 2010-07-12 NOTE — Procedures (Signed)
Summary: Colonoscopy  Patient: Erica Mills Note: All result statuses are Final unless otherwise noted.  Tests: (1) Colonoscopy (COL)   COL Colonoscopy           DONE     [facility]     [address]     [place], [place]  [address]           COLONOSCOPY PROCEDURE REPORT           PATIENT:  Erica Mills, Erica Mills  MR#:  [id number]     BIRTHDATE:  December 31, 1955, 54 yrs. old  GENDER:  female     ENDOSCOPIST:  Iva Boop, MD, [facility]     REF. BY:  Oliver Barre, M.D.     PROCEDURE DATE:  07/06/2010     PROCEDURE:  Colonoscopy 19147     ASA CLASS:  Class II     INDICATIONS:  Routine Risk Screening     MEDICATIONS:   Fentanyl 75 mcg IV, Versed 7 mg IV           DESCRIPTION OF PROCEDURE:   After the risks benefits and     alternatives of the procedure were thoroughly explained, informed     consent was obtained.  Digital rectal exam was performed and     revealed no abnormalities.   The [id number] endoscope was     introduced through the anus and advanced to the cecum, which was     identified by both the appendix and ileocecal valve, without     limitations.  The quality of the prep was excellent, using     MoviPrep.  The instrument was then slowly withdrawn as the colon     was fully examined. Insertion:4:08 minutes Withdrawal: 12:21     minutes     <<PROCEDUREIMAGES>>           FINDINGS:  Mild diverticulosis was found in the sigmoid colon.     This was otherwise a normal examination of the colon.   Retroflexed     views in the rectum revealed no abnormalities.    The scope was     then withdrawn from the patient and the procedure completed.           COMPLICATIONS:  None     ENDOSCOPIC IMPRESSION:     1) Mild diverticulosis in the sigmoid colon     2) Otherwise normal examination with excellent prep           REPEAT EXAM:  In 10 year(s) for routine screening colonoscopy.           Iva Boop, MD, [facility]           CC:  Corwin Levins, MD and The Patient           n.  eSIGNED:   Iva Boop at 07/06/2010 09:39 AM           Dannielle Karvonen, [phone]  Note: An exclamation mark (!) indicates a result that was not dispersed into the flowsheet. Document Creation Date: 07/06/2010 9:39 AM _______________________________________________________________________  (1) Order result status: Final Collection or observation date-time: 07/06/2010 09:33 Requested date-time:  Receipt date-time:  Reported date-time:  Referring Physician:   Ordering Physician: Stan Head [phone]) Specimen Source:  Source: Launa Grill Order Number: [id number] Lab site:   Appended Document: Colonoscopy    Clinical Lists Changes  Observations: Added new observation of COLONNXTDUE: 06/2020 (07/06/2010 12:58)

## 2010-07-12 NOTE — Miscellaneous (Signed)
Summary: [facility] Previsit/prep  Clinical Lists Changes  Medications: Added new medication of MOVIPREP 100 GM  SOLR (PEG-KCL-NACL-NASULF-NA ASC-C) As per prep instructions. - Signed Rx of MOVIPREP 100 GM  SOLR (PEG-KCL-NACL-NASULF-NA ASC-C) As per prep instructions.;  #1 x 0;  Signed;  Entered by: Wyona Almas RN;  Authorized by: Iva Boop MD, [facility];  Method used: [facility] [address] [phone]*, [address], [place], [place], [place]  [address], Ph: [phone] or [phone], Fax: [phone] Allergies: Changed allergy or adverse reaction from SULFA to SULFA Observations: Added new observation of ALLERGY REV: Done (06/06/2010 13:25)    Prescriptions: MOVIPREP 100 GM  SOLR (PEG-KCL-NACL-NASULF-NA ASC-C) As per prep instructions.  #1 x 0   Entered by:   Wyona Almas RN   Authorized by:   Iva Boop MD, [facility]   Signed by:   Wyona Almas RN on 06/06/2010   Method used:   [facility]ronically to        [facility] [address] [phone]* (retail)       [address]       [place], [place]  [address]       Ph: [phone] or [phone]       Fax: [phone]   RxID:   [id number]

## 2010-07-12 NOTE — Letter (Signed)
Summary: [facility] Instructions  [facility]  [address] [place], [place] [address]   Phone: [phone]  Fax: [phone]       Erica Mills    1956/01/30    MRN: [id number]        Procedure Day /Date: Friday 07-06-10     Arrival Time: 8:00 a.m.     Procedure Time: 9:00 a.m.     Location of Procedure:                    _x _  [facility] (4th Floor)   PREPARATION FOR COLONOSCOPY WITH MOVIPREP   Starting 5 days prior to your procedure  07-01-10  do not eat nuts, seeds, popcorn, corn, beans, peas,  salads, or any raw vegetables.  Do not take any fiber supplements (e.g. Metamucil, Citrucel, and Benefiber).  THE DAY BEFORE YOUR PROCEDURE         DATE:  07-05-10   DAY: Thursday   1.  Drink clear liquids the entire day-NO SOLID FOOD  2.  Do not drink anything colored red or purple.  Avoid juices with pulp.  No orange juice.  3.  Drink at least 64 oz. (8 glasses) of fluid/clear liquids during the day to prevent dehydration and help the prep work efficiently.  CLEAR LIQUIDS INCLUDE: Water Jello Ice Popsicles Tea (sugar ok, no milk/cream) Powdered fruit flavored drinks Coffee (sugar ok, no milk/cream) Gatorade Juice: apple, white grape, white cranberry  Lemonade Clear bullion, consomm, broth Carbonated beverages (any kind) Strained chicken noodle soup Hard Candy                             4.  In the morning, mix first dose of MoviPrep solution:    Empty 1 Pouch A and 1 Pouch B into the disposable container    Add lukewarm drinking water to the top line of the container. Mix to dissolve    Refrigerate (mixed solution should be used within 24 hrs)  5.  Begin drinking the prep at 5:00 p.m. The MoviPrep container is divided by 4 marks.   Every 15 minutes drink the solution down to the next mark (approximately 8 oz) until the full liter is complete.   6.  Follow completed prep with 16 oz of clear liquid of your choice (Nothing red or purple).   Continue to drink clear liquids until bedtime.  7.  Before going to bed, mix second dose of MoviPrep solution:    Empty 1 Pouch A and 1 Pouch B into the disposable container    Add lukewarm drinking water to the top line of the container. Mix to dissolve    Refrigerate  THE DAY OF YOUR PROCEDURE      DATE:  07-06-10 DAY:  Friday  Beginning at  4:00 a.m. (5 hours before procedure):         1. Every 15 minutes, drink the solution down to the next mark (approx 8 oz) until the full liter is complete.  2. Follow completed prep with 16 oz. of clear liquid of your choice.    3. You may drink clear liquids until  7:00 a.m.  (2 HOURS BEFORE PROCEDURE).   MEDICATION INSTRUCTIONS  Unless otherwise instructed, you should take regular prescription medications with a small sip of water   as early as possible the morning of your procedure.        OTHER INSTRUCTIONS  You will need a responsible adult at least 55 years of age to accompany you and drive you home.   This person must remain in the waiting room during your procedure.  Wear loose fitting clothing that is easily removed.  Leave jewelry and other valuables at home.  However, you may wish to bring a book to read or  an iPod/MP3 player to listen to music as you wait for your procedure to start.  Remove all body piercing jewelry and leave at home.  Total time from sign-in until discharge is approximately 2-3 hours.  You should go home directly after your procedure and rest.  You can resume normal activities the  day after your procedure.  The day of your procedure you should not:   Drive   Make legal decisions   Operate machinery   Drink alcohol   Return to work  You will receive specific instructions about eating, activities and medications before you leave.    The above instructions have been reviewed and explained to me by   Wyona Almas RN  June 06, 2010 1:56 PM     I fully understand and can  verbalize these instructions _____________________________ Date _________

## 2010-07-12 NOTE — Progress Notes (Signed)
Summary: Rx change  Phone Note Call from Patient   Caller: Patient [phone] Summary of Call: Pt called stating that MD was going to change Simvastatin to Lipitor at last OV but Rx was not sent to pharmacy, this is including in last OV. Okay to change and fill? Initial call taken by: Margaret Pyle, CMA,  June 12, 2010 3:32 PM  Follow-up for Phone Call        yes, ok to do this - change simvasttin to lipitor 20 mg - to robin to handle Follow-up by: Corwin Levins MD,  June 12, 2010 5:33 PM  Additional Follow-up for Phone Call Additional follow up Details #1::        Called pt. informed prescription requested has been sent to her pharmacy. Additional Follow-up by: Robin Ewing CMA (AAMA),  June 13, 2010 7:59 AM    New/Updated Medications: LIPITOR 20 MG TABS (ATORVASTATIN CALCIUM)  Prescriptions: LIPITOR 20 MG TABS (ATORVASTATIN CALCIUM)   #30 x 11   Entered by:   Zella Ball Ewing CMA (AAMA)   Authorized by:   Corwin Levins MD   Signed by:   Scharlene Gloss CMA (AAMA) on 06/13/2010   Method used:   [facility]ed to ...       [facility] [address] [phone]* (retail)       [address]       [place], [place]  [address]       Ph: [phone] or [phone]       Fax: [phone]   RxID:   [id number]

## 2011-01-30 DIAGNOSIS — I10 Essential (primary) hypertension: Secondary | ICD-10-CM

## 2011-02-05 ENCOUNTER — Encounter: Payer: Self-pay | Admitting: Internal Medicine

## 2011-02-05 DIAGNOSIS — Z0001 Encounter for general adult medical examination with abnormal findings: Secondary | ICD-10-CM | POA: Insufficient documentation

## 2011-02-05 DIAGNOSIS — E119 Type 2 diabetes mellitus without complications: Secondary | ICD-10-CM | POA: Insufficient documentation

## 2011-02-05 DIAGNOSIS — Z0000 Encounter for general adult medical examination without abnormal findings: Secondary | ICD-10-CM | POA: Insufficient documentation

## 2011-02-05 DIAGNOSIS — E1122 Type 2 diabetes mellitus with diabetic chronic kidney disease: Secondary | ICD-10-CM | POA: Insufficient documentation

## 2011-02-08 ENCOUNTER — Encounter: Payer: Self-pay | Admitting: Internal Medicine

## 2011-02-08 ENCOUNTER — Ambulatory Visit (INDEPENDENT_AMBULATORY_CARE_PROVIDER_SITE_OTHER): Payer: BC Managed Care – PPO | Admitting: Internal Medicine

## 2011-02-08 VITALS — BP 130/90 | HR 79 | Temp 98.0°F | Ht 63.0 in | Wt 174.4 lb

## 2011-02-08 DIAGNOSIS — R232 Flushing: Secondary | ICD-10-CM

## 2011-02-08 DIAGNOSIS — I10 Essential (primary) hypertension: Secondary | ICD-10-CM

## 2011-02-08 DIAGNOSIS — E785 Hyperlipidemia, unspecified: Secondary | ICD-10-CM

## 2011-02-08 DIAGNOSIS — N951 Menopausal and female climacteric states: Secondary | ICD-10-CM

## 2011-02-08 DIAGNOSIS — Z0000 Encounter for general adult medical examination without abnormal findings: Secondary | ICD-10-CM

## 2011-02-08 DIAGNOSIS — J309 Allergic rhinitis, unspecified: Secondary | ICD-10-CM

## 2011-02-08 DIAGNOSIS — R7302 Impaired glucose tolerance (oral): Secondary | ICD-10-CM

## 2011-02-08 DIAGNOSIS — R7309 Other abnormal glucose: Secondary | ICD-10-CM

## 2011-02-08 MED ORDER — VARENICLINE TARTRATE 1 MG PO TABS: 1.0000 mg | ORAL_TABLET | Freq: Two times a day (BID) | ORAL | Status: DC

## 2011-02-08 MED ORDER — NAPROXEN 500 MG PO TABS: 500.0000 mg | ORAL_TABLET | Freq: Two times a day (BID) | ORAL | Status: DC

## 2011-02-08 MED ORDER — FEXOFENADINE HCL 180 MG PO TABS: 180.0000 mg | ORAL_TABLET | Freq: Every day | ORAL | Status: DC

## 2011-02-08 MED ORDER — FLUTICASONE PROPIONATE 50 MCG/ACT NA SUSP: 2.0000 | Freq: Every day | NASAL | Status: DC

## 2011-02-08 MED ORDER — SERTRALINE HCL 50 MG PO TABS: 50.0000 mg | ORAL_TABLET | Freq: Every day | ORAL | Status: DC

## 2011-02-08 NOTE — Assessment & Plan Note (Signed)
Lab Results  Component Value Date   LDLCALC 115* 05/07/2010   stable overall by hx and exam, most recent data reviewed with pt, and pt to continue medical treatment as before

## 2011-02-08 NOTE — Progress Notes (Signed)
Subjective:    Patient ID: Erica Mills, female    DOB: [birth year], 55 y.o.   MRN: [id number]  HPI  Here to f/u; overall doing ok except for mild mood swings, hot flashes frequent and night sweats;  Pt denies chest pain, increased sob or doe, wheezing, orthopnea, PND, increased LE swelling, palpitations, dizziness or syncope.  Pt denies new neurological symptoms such as new headache, or facial or extremity weakness or numbness  ; Pt denies polydipsia, polyuria  Pt states overall good compliance with meds, trying to follow lower cholesterol, diabetic diet, wt overall stable but little exercise however.    Requests chantix refill to help with quitting smoking.  Denies worsening depressive symptoms, suicidal ideation, or panic, though has ongoing anxiety, not increased recently.   Pain ok with current naproxyn - requests refill as well. Does have several wks ongoing nasal allergy symptoms with clear congestion, itch and sneeze, without fever, pain, ST, cough or wheezing.  Past Medical History  Diagnosis Date  . ACHILLES TENDINITIS 10/06/2009  . ALLERGIC RHINITIS 05/19/2007  . CARPAL TUNNEL SYNDROME, BILATERAL 05/19/2007  . ELBOW PAIN, RIGHT 04/21/2008  . GLUCOSE INTOLERANCE 02/14/2009  . HYPERLIPIDEMIA 05/19/2007  . HYPERTENSION 05/19/2007  . HYPERTHYROIDISM 03/10/2009  . Lateral epicondylitis  of elbow 04/21/2008  . SINUSITIS- ACUTE-NOS 05/11/2010  . SMOKER 05/19/2007   Past Surgical History  Procedure Date  . Tonsillectomy   . Cesarean section     reports that she has been smoking.  She does not have any smokeless tobacco history on file. She reports that she does not drink alcohol or use illicit drugs. family history includes Cancer in her paternal uncle; Diabetes in her father; and Hypertension in her mother. Allergies  Allergen Reactions  . Sulfonamide Derivatives     REACTION: rash   Current Outpatient Prescriptions on File Prior to Visit  Medication Sig Dispense Refill  . aspirin 81 MG  tablet Take 81 mg by mouth daily.        Marland Kitchen atorvastatin (LIPITOR) 20 MG tablet Take 20 mg by mouth daily.        Marland Kitchen diltiazem (CARDIZEM CD) 240 MG 24 hr capsule Take 240 mg by mouth daily.        . simvastatin (ZOCOR) 20 MG tablet Take 20 mg by mouth daily.         Review of Systems Review of Systems  Constitutional: Negative for diaphoresis and unexpected weight change.  HENT: Negative for drooling and tinnitus.   Eyes: Negative for photophobia and visual disturbance.  Respiratory: Negative for choking and stridor.   Gastrointestinal: Negative for vomiting and blood in stool.  Genitourinary: Negative for hematuria and decreased urine volume.     Objective:   Physical Exam BP 130/90  Pulse 79  Temp(Src) 98 F (36.7 C) (Oral)  Ht 5\' 3"  (1.6 m)  Wt 174 lb 6 oz (79.096 kg)  BMI 30.89 kg/m2  SpO2 97% Physical Exam  VS noted Constitutional: Pt appears well-developed and well-nourished.  HENT: Head: Normocephalic.  Right Ear: External ear normal.  Left Ear: External ear normal.  Eyes: Conjunctivae and EOM are normal. Pupils are equal, round, and reactive to light.  Neck: Normal range of motion. Neck supple.  Cardiovascular: Normal rate and regular rhythm.   Pulmonary/Chest: Effort normal and breath sounds normal.  Neurological: Pt is alert. No cranial nerve deficit.  Skin: Skin is warm. No erythema.  Psychiatric: Pt behavior is normal. Thought content normal. 1+ nervous  Assessment & Plan:

## 2011-02-08 NOTE — Assessment & Plan Note (Signed)
Mild to mod, for allergy meds as per emr,  to f/u any worsening symptoms or concerns

## 2011-02-08 NOTE — Assessment & Plan Note (Signed)
BP Readings from Last 3 Encounters:  02/08/11 130/90  05/11/10 130/88  10/06/09 120/78   stable overall by hx and exam, most recent data reviewed with pt, and pt to continue medical treatment as before

## 2011-02-08 NOTE — Assessment & Plan Note (Signed)
Lab Results  Component Value Date   HGBA1C 5.8 02/14/2009    stable overall by hx and exam, most recent data reviewed with pt, and pt to continue medical treatment as before

## 2011-02-08 NOTE — Assessment & Plan Note (Signed)
Mild post menopausal, stress related most likely - for zoloft 50 qd,  to f/u any worsening symptoms or concerns

## 2011-02-08 NOTE — Patient Instructions (Addendum)
Take all new medications as prescribed - the generic lower dose zoloft, and the allergy meds Continue all other medications as before Your refills were sent to the pharmacy Please return in 6 mo with Lab testing done 3-5 days before

## 2011-02-09 ENCOUNTER — Other Ambulatory Visit: Payer: Self-pay | Admitting: Internal Medicine

## 2011-04-25 ENCOUNTER — Other Ambulatory Visit (INDEPENDENT_AMBULATORY_CARE_PROVIDER_SITE_OTHER): Payer: Self-pay | Admitting: General Surgery

## 2011-04-25 DIAGNOSIS — Z1231 Encounter for screening mammogram for malignant neoplasm of breast: Secondary | ICD-10-CM

## 2011-05-01 ENCOUNTER — Ambulatory Visit
Admission: RE | Admit: 2011-05-01 | Discharge: 2011-05-01 | Disposition: A | Discharge status: Home / Self Care | Payer: BC Managed Care – PPO | Source: Ambulatory Visit | Attending: General Surgery | Admitting: General Surgery

## 2011-05-01 DIAGNOSIS — Z1231 Encounter for screening mammogram for malignant neoplasm of breast: Secondary | ICD-10-CM

## 2011-06-26 ENCOUNTER — Other Ambulatory Visit: Payer: Self-pay | Admitting: Internal Medicine

## 2011-06-28 ENCOUNTER — Ambulatory Visit: Payer: BC Managed Care – PPO | Admitting: Internal Medicine

## 2011-07-09 ENCOUNTER — Encounter: Payer: Self-pay | Admitting: Internal Medicine

## 2011-07-09 ENCOUNTER — Ambulatory Visit (INDEPENDENT_AMBULATORY_CARE_PROVIDER_SITE_OTHER): Payer: BC Managed Care – PPO | Admitting: Internal Medicine

## 2011-07-09 ENCOUNTER — Other Ambulatory Visit (INDEPENDENT_AMBULATORY_CARE_PROVIDER_SITE_OTHER): Payer: BC Managed Care – PPO

## 2011-07-09 ENCOUNTER — Other Ambulatory Visit: Payer: Self-pay | Admitting: Internal Medicine

## 2011-07-09 VITALS — BP 130/82 | HR 78 | Temp 97.0°F | Ht 63.0 in | Wt 166.1 lb

## 2011-07-09 DIAGNOSIS — Z0000 Encounter for general adult medical examination without abnormal findings: Secondary | ICD-10-CM

## 2011-07-09 DIAGNOSIS — R7309 Other abnormal glucose: Secondary | ICD-10-CM

## 2011-07-09 DIAGNOSIS — R232 Flushing: Secondary | ICD-10-CM

## 2011-07-09 DIAGNOSIS — N951 Menopausal and female climacteric states: Secondary | ICD-10-CM

## 2011-07-09 DIAGNOSIS — R7302 Impaired glucose tolerance (oral): Secondary | ICD-10-CM

## 2011-07-09 DIAGNOSIS — F17200 Nicotine dependence, unspecified, uncomplicated: Secondary | ICD-10-CM

## 2011-07-09 DIAGNOSIS — E0590 Thyrotoxicosis, unspecified without thyrotoxic crisis or storm: Secondary | ICD-10-CM

## 2011-07-09 LAB — LIPID PANEL
Cholesterol: 174 mg/dL (ref 0–200)
LDL Cholesterol: 101 mg/dL — ABNORMAL HIGH (ref 0–99)
Total CHOL/HDL Ratio: 3
Triglycerides: 100 mg/dL (ref 0.0–149.0)
VLDL: 20 mg/dL (ref 0.0–40.0)

## 2011-07-09 LAB — URINALYSIS, ROUTINE W REFLEX MICROSCOPIC
Bilirubin Urine: NEGATIVE
Nitrite: NEGATIVE
Total Protein, Urine: NEGATIVE
Urine Glucose: NEGATIVE
pH: 6.5 (ref 5.0–8.0)

## 2011-07-09 LAB — CBC WITH DIFFERENTIAL/PLATELET
Basophils Relative: 0.5 % (ref 0.0–3.0)
Eosinophils Relative: 2.5 % (ref 0.0–5.0)
HCT: 40.3 % (ref 36.0–46.0)
Hemoglobin: 13.8 g/dL (ref 12.0–15.0)
Lymphs Abs: 2.2 10*3/uL (ref 0.7–4.0)
MCV: 91.3 fl (ref 78.0–100.0)
Monocytes Absolute: 0.6 10*3/uL (ref 0.1–1.0)
Neutro Abs: 5.8 10*3/uL (ref 1.4–7.7)
Neutrophils Relative %: 65.2 % (ref 43.0–77.0)
RBC: 4.42 Mil/uL (ref 3.87–5.11)
WBC: 8.9 10*3/uL (ref 4.5–10.5)

## 2011-07-09 LAB — BASIC METABOLIC PANEL
BUN: 14 mg/dL (ref 6–23)
Calcium: 9.7 mg/dL (ref 8.4–10.5)
Creatinine, Ser: 0.8 mg/dL (ref 0.4–1.2)
GFR: 96.82 mL/min (ref >60.00)

## 2011-07-09 LAB — HEPATIC FUNCTION PANEL
Bilirubin, Direct: 0 mg/dL (ref 0.0–0.3)
Total Bilirubin: 0.4 mg/dL (ref 0.3–1.2)

## 2011-07-09 MED ORDER — ATORVASTATIN CALCIUM 20 MG PO TABS: 20.0000 mg | ORAL_TABLET | Freq: Every day | ORAL | Status: DC

## 2011-07-09 MED ORDER — BENZONATATE 100 MG PO CAPS: ORAL_CAPSULE | ORAL | Status: DC

## 2011-07-09 MED ORDER — VARENICLINE TARTRATE 1 MG PO TABS: 1.0000 mg | ORAL_TABLET | Freq: Two times a day (BID) | ORAL | Status: DC

## 2011-07-09 MED ORDER — DILTIAZEM HCL ER COATED BEADS 240 MG PO CP24
240.0000 mg | ORAL_CAPSULE | Freq: Every day | ORAL | Status: DC
Start: 2011-07-09 — End: 2012-07-01

## 2011-07-09 MED ORDER — SIMVASTATIN 20 MG PO TABS: 20.0000 mg | ORAL_TABLET | Freq: Every day | ORAL | Status: DC

## 2011-07-09 MED ORDER — SERTRALINE HCL 50 MG PO TABS: 50.0000 mg | ORAL_TABLET | Freq: Every day | ORAL | Status: DC

## 2011-07-09 MED ORDER — FLUTICASONE PROPIONATE 50 MCG/ACT NA SUSP: 2.0000 | Freq: Every day | NASAL | Status: DC

## 2011-07-09 MED ORDER — NAPROXEN 500 MG PO TABS: 500.0000 mg | ORAL_TABLET | Freq: Two times a day (BID) | ORAL | Status: DC

## 2011-07-09 NOTE — Assessment & Plan Note (Signed)
Overall doing well, age appropriate education and counseling updated, referrals for preventative services and immunizations addressed, dietary and smoking counseling addressed, most recent labs and ECG reviewed.  I have personally reviewed and have noted: 1) the patient's medical and social history 2) The pt's use of alcohol, tobacco, and illicit drugs 3) The patient's current medications and supplements 4) Functional ability including ADL's, fall risk, home safety risk, hearing and visual impairment 5) Diet and physical activities 6) Evidence for depression or mood disorder 7) The patient's height, weight, and BMI have been recorded in the chart I have made referrals, and provided counseling and education based on review of the above For labs today, o/w up to date with prevention

## 2011-07-09 NOTE — Assessment & Plan Note (Signed)
.  stable overall by hx and exam, most recent data reviewed with pt, and pt to continue medical treatment as before  Lab Results  Component Value Date   HGBA1C 5.8 02/14/2009

## 2011-07-09 NOTE — Assessment & Plan Note (Signed)
For chantix refill, d/w pt

## 2011-07-09 NOTE — Patient Instructions (Addendum)
Take all new medications as prescribed - the pills for mild cough Continue all other medications as before; all of your prescriptoins were sent to the pharmacy Please go to LAB in the Basement for the blood and/or urine tests to be done today Please call the phone number [phone] (the [employer]Tree System) for results of testing in 2-3 days;  When calling, simply dial the number, and when prompted enter the MRN number above (the Medical Record Number) and the # key, then the message should start. Please return in 1 year for your yearly visit, or sooner if needed, with Lab testing done 3-5 days before

## 2011-07-09 NOTE — Progress Notes (Signed)
Subjective:    Patient ID: Erica Mills, female    DOB: 1955/07/11, 56 y.o.   MRN: [id number]  HPI  Here for wellness and f/u;  Overall doing ok;  Pt denies CP, worsening SOB, DOE, wheezing, orthopnea, PND, worsening LE edema, palpitations, dizziness or syncope.  Pt denies neurological change such as new Headache, facial or extremity weakness.  Pt denies polydipsia, polyuria, or low sugar symptoms. Pt states overall good compliance with treatment and medications, good tolerability, and trying to follow lower cholesterol diet.  Pt denies worsening depressive symptoms, suicidal ideation or panic. No fever, wt loss, night sweats, loss of appetite, or other constitutional symptoms.  Pt states good ability with ADL's, low fall risk, home safety reviewed and adequate, no significant changes in hearing or vision, and occasionally active with exercise. Lost from 177 to current 166 with better diet in the past yr. No other new complaints, except for occas smokers cough, asks for chantix refill as well. Past Medical History  Diagnosis Date  . ACHILLES TENDINITIS 10/06/2009  . ALLERGIC RHINITIS 05/19/2007  . CARPAL TUNNEL SYNDROME, BILATERAL 05/19/2007  . ELBOW PAIN, RIGHT 04/21/2008  . GLUCOSE INTOLERANCE 02/14/2009  . HYPERLIPIDEMIA 05/19/2007  . HYPERTENSION 05/19/2007  . HYPERTHYROIDISM 03/10/2009  . Lateral epicondylitis  of elbow 04/21/2008  . SINUSITIS- ACUTE-NOS 05/11/2010  . SMOKER 05/19/2007   Past Surgical History  Procedure Date  . Tonsillectomy   . Cesarean section     reports that she has been smoking.  She does not have any smokeless tobacco history on file. She reports that she does not drink alcohol or use illicit drugs. family history includes Cancer in her paternal uncle; Diabetes in her father; and Hypertension in her mother. Allergies  Allergen Reactions  . Sulfonamide Derivatives     REACTION: rash   Current Outpatient Prescriptions on File Prior to Visit  Medication Sig Dispense  Refill  . aspirin 81 MG tablet Take 81 mg by mouth daily.        . fexofenadine (ALLEGRA) 180 MG tablet Take 1 tablet (180 mg total) by mouth daily.  30 tablet  2   Review of Systems Review of Systems  Constitutional: Negative for diaphoresis, activity change, appetite change and unexpected weight change.  HENT: Negative for hearing loss, ear pain, facial swelling, mouth sores and neck stiffness.   Eyes: Negative for pain, redness and visual disturbance.  Respiratory: Negative for shortness of breath and wheezing.   Cardiovascular: Negative for chest pain and palpitations.  Gastrointestinal: Negative for diarrhea, blood in stool, abdominal distention and rectal pain.  Genitourinary: Negative for hematuria, flank pain and decreased urine volume.  Musculoskeletal: Negative for myalgias and joint swelling.  Skin: Negative for color change and wound.  Neurological: Negative for syncope and numbness.  Hematological: Negative for adenopathy.  Psychiatric/Behavioral: Negative for hallucinations, self-injury, decreased concentration and agitation.      Objective:   Physical Exam BP 130/82  Pulse 78  Temp(Src) 97 F (36.1 C) (Oral)  Ht 5\' 3"  (1.6 m)  Wt 166 lb 2 oz (75.354 kg)  BMI 29.43 kg/m2  SpO2 97% Physical Exam  VS noted Constitutional: Pt is oriented to person, place, and time. Appears well-developed and well-nourished.  HENT:  Head: Normocephalic and atraumatic.  Right Ear: External ear normal.  Left Ear: External ear normal.  Nose: Nose normal.  Mouth/Throat: Oropharynx is clear and moist.  Eyes: Conjunctivae and EOM are normal. Pupils are equal, round, and reactive to light.  Neck:  Normal range of motion. Neck supple. No JVD present. No tracheal deviation present.  Cardiovascular: Normal rate, regular rhythm, normal heart sounds and intact distal pulses.   Pulmonary/Chest: Effort normal and breath sounds normal.  Abdominal: Soft. Bowel sounds are normal. There is no  tenderness.  Musculoskeletal: Normal range of motion. Exhibits no edema.  Lymphadenopathy:  Has no cervical adenopathy.  Neurological: Pt is alert and oriented to person, place, and time. Pt has normal reflexes. No cranial nerve deficit.  Skin: Skin is warm and dry. No rash noted.  Psychiatric:  Has  normal mood and affect. Behavior is normal.     Assessment & Plan:

## 2011-08-09 ENCOUNTER — Ambulatory Visit: Payer: BC Managed Care – PPO | Admitting: Endocrinology

## 2011-08-09 ENCOUNTER — Ambulatory Visit: Payer: BC Managed Care – PPO | Admitting: Internal Medicine

## 2011-08-09 DIAGNOSIS — Z0289 Encounter for other administrative examinations: Secondary | ICD-10-CM

## 2011-09-27 ENCOUNTER — Ambulatory Visit (INDEPENDENT_AMBULATORY_CARE_PROVIDER_SITE_OTHER): Payer: BC Managed Care – PPO | Admitting: Endocrinology

## 2011-09-27 ENCOUNTER — Encounter: Payer: Self-pay | Admitting: Endocrinology

## 2011-09-27 ENCOUNTER — Ambulatory Visit: Payer: BC Managed Care – PPO | Admitting: Endocrinology

## 2011-09-27 VITALS — BP 122/86 | HR 90 | Temp 98.2°F | Ht 63.0 in | Wt 173.8 lb

## 2011-09-27 DIAGNOSIS — E0590 Thyrotoxicosis, unspecified without thyrotoxic crisis or storm: Secondary | ICD-10-CM

## 2011-09-27 MED ORDER — ATORVASTATIN CALCIUM 20 MG PO TABS: 20.0000 mg | ORAL_TABLET | Freq: Every day | ORAL | Status: DC

## 2011-09-27 NOTE — Progress Notes (Signed)
Subjective:    Patient ID: Erica Mills, female    DOB: 04/16/1956, 56 y.o.   MRN: [id number]  HPI Pt has 4 years h/o hyperthyroidism.  Scan in 2010 showed low uptake.  Abnormal tsh resolved in 2011, but has now recurred.  pt states she feels well in general.  Denies weight change. Past Medical History  Diagnosis Date  . ACHILLES TENDINITIS 10/06/2009  . ALLERGIC RHINITIS 05/19/2007  . CARPAL TUNNEL SYNDROME, BILATERAL 05/19/2007  . ELBOW PAIN, RIGHT 04/21/2008  . GLUCOSE INTOLERANCE 02/14/2009  . HYPERLIPIDEMIA 05/19/2007  . HYPERTENSION 05/19/2007  . HYPERTHYROIDISM 03/10/2009  . Lateral epicondylitis  of elbow 04/21/2008  . SINUSITIS- ACUTE-NOS 05/11/2010  . SMOKER 05/19/2007    Past Surgical History  Procedure Date  . Tonsillectomy   . Cesarean section     History   Social History  . Marital Status: Married    Spouse Name: N/A    Number of Children: N/A  . Years of Education: N/A   Occupational History  . Not on file.   Social History Main Topics  . Smoking status: Current Everyday Smoker  . Smokeless tobacco: Not on file  . Alcohol Use: No  . Drug Use: No  . Sexually Active: Not on file   Other Topics Concern  . Not on file   Social History Narrative  . No narrative on file    Current Outpatient Prescriptions on File Prior to Visit  Medication Sig Dispense Refill  . aspirin 81 MG tablet Take 81 mg by mouth daily.        Marland Kitchen atorvastatin (LIPITOR) 20 MG tablet Take 1 tablet (20 mg total) by mouth daily.  90 tablet  3  . diltiazem (CARDIZEM CD) 240 MG 24 hr capsule Take 1 capsule (240 mg total) by mouth daily.  90 capsule  3  . fexofenadine (ALLEGRA) 180 MG tablet Take 1 tablet (180 mg total) by mouth daily.  30 tablet  2  . fluticasone (FLONASE) 50 MCG/ACT nasal spray Place 2 sprays into the nose daily.  16 g  5  . naproxen (NAPROSYN) 500 MG tablet Take 1 tablet (500 mg total) by mouth 2 (two) times daily.  180 tablet  3  . sertraline (ZOLOFT) 50 MG tablet Take 1  tablet (50 mg total) by mouth daily.  90 tablet  3  . benzonatate (TESSALON PERLES) 100 MG capsule 1-2 tabs  By mouth three times per day as needed for cough  90 capsule  0  . varenicline (CHANTIX CONTINUING MONTH PAK) 1 MG tablet Take 1 tablet (1 mg total) by mouth 2 (two) times daily.  60 tablet  2    Allergies  Allergen Reactions  . Sulfonamide Derivatives     REACTION: rash    Family History  Problem Relation Age of Onset  . Hypertension Mother   . Diabetes Father   . Cancer Paternal Uncle     colon cancer    BP 122/86  Pulse 90  Temp(Src) 98.2 F (36.8 C) (Oral)  Ht 5\' 3"  (1.6 m)  Wt 173 lb 12.8 oz (78.835 kg)  BMI 30.79 kg/m2  SpO2 97%   Review of Systems Denies palpitations and tremorr    Objective:   Physical Exam VITAL SIGNS:  See vs page GENERAL: no distress 2 thyroid nodules are easily palpable:  2 cm right, and 1 cm left.   Lab Results  Component Value Date   TSH 0.07* 07/09/2011  Assessment & Plan:  Hyperthyroidism, recurrent

## 2011-09-27 NOTE — Patient Instructions (Signed)
let's recheck the thyroid "scan" (a special, but easy and painless type of thyroid x ray).  It works like this: you go to the x-ray department of the hospital to swallow a pill, which contains a miniscule amount of radiation.  You will not notice any symptoms from this.  You will go back to the x-ray department the next day, to lie down in front of a camera.  The results of this will be sent to me.   Based on the results, i hope to order for you a treatment pill of radioactive iodine.  Although it is a larger amount of radiation, you will again notice no symptoms from this.  The pill is gone from your body in a few days (during which you should stay away from other people), but takes several months to work.  Therefore, please return here approximately 6-8 weeks after the treatment.  This treatment has been available for many years, and the only known side-effect is an underactive thyroid.  It is possible that i would eventually prescribe for you a thyroid hormone pill, which is very inexpensive.  You don't have to worry about side-effects of this thyroid hormone pill, because it is the same molecule your thyroid makes.  

## 2011-10-02 ENCOUNTER — Encounter (HOSPITAL_COMMUNITY)
Admission: RE | Admit: 2011-10-02 | Discharge: 2011-10-02 | Disposition: A | Discharge status: Home / Self Care | Payer: BC Managed Care – PPO | Source: Ambulatory Visit | Attending: Endocrinology | Admitting: Endocrinology

## 2011-10-02 DIAGNOSIS — E0520 Thyrotoxicosis with toxic multinodular goiter without thyrotoxic crisis or storm: Secondary | ICD-10-CM | POA: Insufficient documentation

## 2011-10-02 DIAGNOSIS — E0590 Thyrotoxicosis, unspecified without thyrotoxic crisis or storm: Secondary | ICD-10-CM

## 2011-10-03 ENCOUNTER — Encounter (HOSPITAL_COMMUNITY)
Admission: RE | Admit: 2011-10-03 | Discharge: 2011-10-03 | Disposition: A | Discharge status: Home / Self Care | Payer: BC Managed Care – PPO | Source: Ambulatory Visit | Attending: Endocrinology | Admitting: Endocrinology

## 2011-10-03 ENCOUNTER — Other Ambulatory Visit: Payer: Self-pay | Admitting: Endocrinology

## 2011-10-03 ENCOUNTER — Encounter (HOSPITAL_COMMUNITY): Payer: Self-pay

## 2011-10-03 DIAGNOSIS — E0590 Thyrotoxicosis, unspecified without thyrotoxic crisis or storm: Secondary | ICD-10-CM

## 2011-10-03 MED ORDER — SODIUM IODIDE I 131 CAPSULE: 6.9000 | Freq: Once | INTRAVENOUS | Status: AC | PRN

## 2011-10-03 MED ORDER — SODIUM PERTECHNETATE TC 99M INJECTION
10.3000 | Freq: Once | INTRAVENOUS | Status: AC | PRN
  Administered 2011-10-03: 10.3 via INTRAVENOUS

## 2011-10-04 ENCOUNTER — Other Ambulatory Visit: Payer: Self-pay | Admitting: Internal Medicine

## 2011-10-16 ENCOUNTER — Ambulatory Visit (HOSPITAL_COMMUNITY): Payer: BC Managed Care – PPO

## 2012-04-19 ENCOUNTER — Other Ambulatory Visit: Payer: Self-pay | Admitting: Internal Medicine

## 2012-06-12 ENCOUNTER — Other Ambulatory Visit: Payer: Self-pay | Admitting: Obstetrics and Gynecology

## 2012-06-12 DIAGNOSIS — Z1231 Encounter for screening mammogram for malignant neoplasm of breast: Secondary | ICD-10-CM

## 2012-07-01 ENCOUNTER — Other Ambulatory Visit: Payer: Self-pay | Admitting: Internal Medicine

## 2012-07-06 ENCOUNTER — Other Ambulatory Visit: Payer: Self-pay | Admitting: Internal Medicine

## 2012-07-07 ENCOUNTER — Encounter: Payer: Self-pay | Admitting: Obstetrics and Gynecology

## 2012-07-07 ENCOUNTER — Ambulatory Visit: Payer: BC Managed Care – PPO | Admitting: Obstetrics and Gynecology

## 2012-07-07 ENCOUNTER — Ambulatory Visit
Admission: RE | Admit: 2012-07-07 | Discharge: 2012-07-07 | Disposition: A | Discharge status: Home / Self Care | Payer: BC Managed Care – PPO | Source: Ambulatory Visit | Attending: Obstetrics and Gynecology | Admitting: Obstetrics and Gynecology

## 2012-07-07 VITALS — BP 122/80 | HR 84 | Ht 64.0 in | Wt 175.0 lb

## 2012-07-07 DIAGNOSIS — B373 Candidiasis of vulva and vagina: Secondary | ICD-10-CM

## 2012-07-07 DIAGNOSIS — Z124 Encounter for screening for malignant neoplasm of cervix: Secondary | ICD-10-CM

## 2012-07-07 DIAGNOSIS — Z1231 Encounter for screening mammogram for malignant neoplasm of breast: Secondary | ICD-10-CM

## 2012-07-07 DIAGNOSIS — Z01419 Encounter for gynecological examination (general) (routine) without abnormal findings: Secondary | ICD-10-CM

## 2012-07-07 LAB — POCT WET PREP (WET MOUNT): pH: 4.5

## 2012-07-07 MED ORDER — FLUCONAZOLE 150 MG PO TABS: 150.0000 mg | ORAL_TABLET | Freq: Once | ORAL | Status: DC

## 2012-07-07 NOTE — Progress Notes (Signed)
Subjective:    NAYDEEN SPEIRS is a 57 y.o. female, G3P0, who presents for an annual exam. The patient reports vaginal itching x 1 week and pain in both arm due to carpal tunnel.  Menstrual cycle:   LMP: No LMP recorded. Patient is not currently having periods (Reason: Perimenopausal).             Review of Systems Pertinent items are noted in HPI. Denies pelvic pain, urinary tract symptoms, vaginitis symptoms, irregular bleeding, menopausal symptoms, change in bowel habits or rectal bleeding   Objective:    BP 122/80  Pulse 84  Ht 5\' 4"  (1.626 m)  Wt 175 lb (79.379 kg)  BMI 30.04 kg/m2   Wt Readings from Last 1 Encounters:  07/07/12 175 lb (79.379 kg)   Body mass index is 30.04 kg/(m^2). General Appearance: Alert, no acute distress HEENT: Grossly normal Neck / Thyroid: Supple, no thyromegaly or cervical adenopathy Lungs: Clear to auscultation bilaterally Back: No CVA tenderness Breast Exam: No masses or nodes.No dimpling, nipple retraction or discharge. Cardiovascular: Regular rate and rhythm.  Gastrointestinal: Soft, non-tender, no masses or organomegaly Pelvic Exam: EGBUS-wnl, vagina-normal rugae, cervix- polyp without  or tenderness, uterus appears normal size shape and consistency, adnexae-no masses or tenderness Rectovaginal: no masses and normal sphincter tone Lymphatic Exam: Non-palpable nodes in neck, clavicular,  axillary, or inguinal regions  Skin: no rashes or abnormalities Extremities: no clubbing cyanosis or edema  Neurologic: grossly normal Psychiatric: Alert and oriented  Wet Prep: ph-4.5, whiff-negative, few yeast   Assessment:   Routine GYN Exam Asymptomatic Cervical Polyp Yeast Vaginitis   Plan:  Diflucan 150 mg  #1 1 po stat 1 refill  PAP sent  RTO 1 year or prn  Keyden Pavlov,ELMIRAPA-C

## 2012-07-07 NOTE — Progress Notes (Signed)
Regular Periods: no Mammogram: yes  Monthly Breast Ex.: no Exercise: yes  Tetanus < 10 years: yes Seatbelts: yes  NI. Bladder Functn.: yes Abuse at home: no  Daily BM's: yes Stressful Work: yes  Healthy Diet: yes Sigmoid-Colonoscopy: 2013    NL  Calcium: no Medical problems this year: PAIN IN BOTH ARMS   LAST PAP:2012  Contraception: BTL  Mammogram:  1/14  PCP: DR. Jonny Ruiz  PMH: NO CHANGE  FMH: NO CHANGE  Last Bone Scan: NEVER  PT IS MARRIED.

## 2012-07-08 ENCOUNTER — Other Ambulatory Visit: Payer: Self-pay | Admitting: Obstetrics and Gynecology

## 2012-07-08 DIAGNOSIS — R928 Other abnormal and inconclusive findings on diagnostic imaging of breast: Secondary | ICD-10-CM

## 2012-07-08 LAB — PAP IG W/ RFLX HPV ASCU

## 2012-07-11 DIAGNOSIS — N6001 Solitary cyst of right breast: Secondary | ICD-10-CM

## 2012-07-11 HISTORY — DX: Solitary cyst of right breast: N60.01

## 2012-07-28 ENCOUNTER — Inpatient Hospital Stay: Admission: RE | Admit: 2012-07-28 | Payer: BC Managed Care – PPO | Source: Ambulatory Visit

## 2012-08-07 ENCOUNTER — Ambulatory Visit
Admission: RE | Admit: 2012-08-07 | Discharge: 2012-08-07 | Disposition: A | Discharge status: Home / Self Care | Payer: BC Managed Care – PPO | Source: Ambulatory Visit | Attending: Obstetrics and Gynecology | Admitting: Obstetrics and Gynecology

## 2012-08-07 ENCOUNTER — Other Ambulatory Visit: Payer: Self-pay | Admitting: Internal Medicine

## 2012-08-07 ENCOUNTER — Ambulatory Visit (INDEPENDENT_AMBULATORY_CARE_PROVIDER_SITE_OTHER): Payer: BC Managed Care – PPO | Admitting: Internal Medicine

## 2012-08-07 ENCOUNTER — Encounter: Payer: Self-pay | Admitting: Internal Medicine

## 2012-08-07 ENCOUNTER — Other Ambulatory Visit (INDEPENDENT_AMBULATORY_CARE_PROVIDER_SITE_OTHER): Payer: BC Managed Care – PPO

## 2012-08-07 ENCOUNTER — Encounter: Payer: Self-pay | Admitting: Obstetrics and Gynecology

## 2012-08-07 VITALS — BP 150/90 | HR 84 | Temp 98.0°F | Ht 63.0 in | Wt 179.0 lb

## 2012-08-07 DIAGNOSIS — R7309 Other abnormal glucose: Secondary | ICD-10-CM

## 2012-08-07 DIAGNOSIS — E0590 Thyrotoxicosis, unspecified without thyrotoxic crisis or storm: Secondary | ICD-10-CM

## 2012-08-07 DIAGNOSIS — I10 Essential (primary) hypertension: Secondary | ICD-10-CM

## 2012-08-07 DIAGNOSIS — M65849 Other synovitis and tenosynovitis, unspecified hand: Secondary | ICD-10-CM

## 2012-08-07 DIAGNOSIS — R7302 Impaired glucose tolerance (oral): Secondary | ICD-10-CM

## 2012-08-07 DIAGNOSIS — M65839 Other synovitis and tenosynovitis, unspecified forearm: Secondary | ICD-10-CM

## 2012-08-07 DIAGNOSIS — M659 Synovitis and tenosynovitis, unspecified: Secondary | ICD-10-CM | POA: Insufficient documentation

## 2012-08-07 DIAGNOSIS — IMO0001 Reserved for inherently not codable concepts without codable children: Secondary | ICD-10-CM

## 2012-08-07 DIAGNOSIS — R928 Other abnormal and inconclusive findings on diagnostic imaging of breast: Secondary | ICD-10-CM

## 2012-08-07 DIAGNOSIS — Z0000 Encounter for general adult medical examination without abnormal findings: Secondary | ICD-10-CM

## 2012-08-07 LAB — CBC WITH DIFFERENTIAL/PLATELET
Basophils Relative: 0.6 % (ref 0.0–3.0)
Eosinophils Relative: 2.1 % (ref 0.0–5.0)
Lymphocytes Relative: 25.5 % (ref 12.0–46.0)
MCV: 88.7 fl (ref 78.0–100.0)
Monocytes Absolute: 0.8 10*3/uL (ref 0.1–1.0)
Neutrophils Relative %: 63.3 % (ref 43.0–77.0)
Platelets: 248 10*3/uL (ref 150.0–400.0)
RBC: 4.41 Mil/uL (ref 3.87–5.11)
WBC: 9.3 10*3/uL (ref 4.5–10.5)

## 2012-08-07 LAB — URINALYSIS, ROUTINE W REFLEX MICROSCOPIC
Ketones, ur: NEGATIVE
Specific Gravity, Urine: 1.025 (ref 1.000–1.030)
Total Protein, Urine: NEGATIVE
Urine Glucose: NEGATIVE
pH: 6 (ref 5.0–8.0)

## 2012-08-07 LAB — BASIC METABOLIC PANEL
BUN: 18 mg/dL (ref 6–23)
Chloride: 106 mEq/L (ref 96–112)
Creatinine, Ser: 0.9 mg/dL (ref 0.4–1.2)
GFR: 79.89 mL/min (ref >60.00)
Glucose, Bld: 84 mg/dL (ref 70–99)
Potassium: 4.2 mEq/L (ref 3.5–5.1)

## 2012-08-07 LAB — HEPATIC FUNCTION PANEL
ALT: 22 U/L (ref 0–35)
Alkaline Phosphatase: 79 U/L (ref 39–117)
Bilirubin, Direct: 0.1 mg/dL (ref 0.0–0.3)
Total Bilirubin: 0.6 mg/dL (ref 0.3–1.2)
Total Protein: 7.2 g/dL (ref 6.0–8.3)

## 2012-08-07 LAB — HM MAMMOGRAPHY

## 2012-08-07 LAB — LIPID PANEL
LDL Cholesterol: 110 mg/dL — ABNORMAL HIGH (ref 0–99)
VLDL: 17.4 mg/dL (ref 0.0–40.0)

## 2012-08-07 LAB — HEMOGLOBIN A1C: Hgb A1c MFr Bld: 6.7 % — ABNORMAL HIGH (ref 4.6–6.5)

## 2012-08-07 LAB — TSH: TSH: 0.28 u[IU]/mL — ABNORMAL LOW (ref 0.35–5.50)

## 2012-08-07 MED ORDER — ATORVASTATIN CALCIUM 20 MG PO TABS: 20.0000 mg | ORAL_TABLET | Freq: Every day | ORAL | Status: DC

## 2012-08-07 NOTE — Assessment & Plan Note (Signed)
stable overall by history and exam, recent data reviewed with pt, and pt to continue medical treatment as before,  to f/u any worsening symptoms or concerns Lab Results  Component Value Date   HGBA1C 6.7* 08/07/2012

## 2012-08-07 NOTE — Assessment & Plan Note (Signed)
Bilat, for predpack asd, avoid overuse, consider hand surgury

## 2012-08-07 NOTE — Progress Notes (Signed)
Subjective:    Patient ID: Erica Mills, female    DOB: [birth year], 57 y.o.   MRN: [id number]  HPI  Here for wellness and f/u;  Overall doing ok;  Pt denies CP, worsening SOB, DOE, wheezing, orthopnea, PND, worsening LE edema, palpitations, dizziness or syncope.  Pt denies neurological change such as new headache, facial or extremity weakness.  Pt denies polydipsia, polyuria, or low sugar symptoms. Pt states overall good compliance with treatment and medications, good tolerability, and has been trying to follow lower cholesterol diet.  Pt denies worsening depressive symptoms, suicidal ideation or panic. No fever, night sweats, wt loss, loss of appetite, or other constitutional symptoms.  Pt states good ability with ADL's, has low fall risk, home safety reviewed and adequate, no other significant changes in hearing or vision, and only occasionally active with exercise.  Did not follow through last yr with I131 tx per Dr Everardo All due to cost and $1500 insurance deductible.  Got up late today, missed taking her BP med, usually < 140/90 at home.  Also incidentally with bilat hand pain/diffuse swelling with overuse recently.  Has some recent left hearing loss.  Quit smoking jan 2014, wt up 171 to 179. Past Medical History  Diagnosis Date  . ACHILLES TENDINITIS 10/06/2009  . ALLERGIC RHINITIS 05/19/2007  . CARPAL TUNNEL SYNDROME, BILATERAL 05/19/2007  . ELBOW PAIN, RIGHT 04/21/2008  . GLUCOSE INTOLERANCE 02/14/2009  . HYPERLIPIDEMIA 05/19/2007  . HYPERTENSION 05/19/2007  . HYPERTHYROIDISM 03/10/2009  . Lateral epicondylitis  of elbow 04/21/2008  . SINUSITIS- ACUTE-NOS 05/11/2010  . SMOKER 05/19/2007  . Fibroids   . H/O menorrhagia   . Cyst of breast, right, solitary 07/2012   Past Surgical History  Procedure Laterality Date  . Tonsillectomy    . Cesarean section      reports that she has quit smoking. She has never used smokeless tobacco. She reports that she does not drink alcohol or use illicit  drugs. family history includes Cancer in her paternal uncle; Diabetes in her father, mother, and sister; and Hypertension in her father and mother. Allergies  Allergen Reactions  . Sulfonamide Derivatives     REACTION: rash   Current Outpatient Prescriptions on File Prior to Visit  Medication Sig Dispense Refill  . aspirin 81 MG tablet Take 81 mg by mouth daily.        . benzonatate (TESSALON) 100 MG capsule TAKE 1-2 CAPSULES BY MOUTH THREE TIMES PER DAY AS NEEDED FOR COUGH  90 capsule  0  . diltiazem (CARDIZEM CD) 240 MG 24 hr capsule TAKE 1 CAPSULE (240 MG TOTAL) BY MOUTH DAILY.  90 capsule  0  . fluconazole (DIFLUCAN) 150 MG tablet Take 1 tablet (150 mg total) by mouth once.  1 tablet  1  . naproxen (NAPROSYN) 500 MG tablet Take 1 tablet (500 mg total) by mouth 2 (two) times daily.  180 tablet  3  . varenicline (CHANTIX CONTINUING MONTH PAK) 1 MG tablet Take 1 tablet (1 mg total) by mouth 2 (two) times daily.  60 tablet  2  . fexofenadine (ALLEGRA) 180 MG tablet Take 1 tablet (180 mg total) by mouth daily.  30 tablet  2  . fluticasone (FLONASE) 50 MCG/ACT nasal spray Place 2 sprays into the nose daily.  16 g  5  . sertraline (ZOLOFT) 50 MG tablet Take 1 tablet (50 mg total) by mouth daily.  90 tablet  3   No current facility-administered medications on file prior to visit.  Review of Systems Constitutional: Negative for diaphoresis, activity change, appetite change or unexpected weight change.  HENT: Negative for hearing loss, ear pain, facial swelling, mouth sores and neck stiffness.   Eyes: Negative for pain, redness and visual disturbance.  Respiratory: Negative for shortness of breath and wheezing.   Cardiovascular: Negative for chest pain and palpitations.  Gastrointestinal: Negative for diarrhea, blood in stool, abdominal distention or other pain Genitourinary: Negative for hematuria, flank pain or change in urine volume.  Musculoskeletal: Negative for myalgias and joint  swelling.  Skin: Negative for color change and wound.  Neurological: Negative for syncope and numbness. other than noted Hematological: Negative for adenopathy.  Psychiatric/Behavioral: Negative for hallucinations, self-injury, decreased concentration and agitation.      Objective:   Physical Exam BP 150/90  Pulse 84  Temp(Src) 98 F (36.7 C) (Oral)  Ht 5\' 3"  (1.6 m)  Wt 179 lb (81.194 kg)  BMI 31.72 kg/m2  SpO2 96% VS noted,  Constitutional: Pt is oriented to person, place, and time. Appears well-developed and well-nourished.  Head: Normocephalic and atraumatic.  Right Ear: External ear normal.  Left Ear: External ear normal. Left ear canal wax impaction removed with irrigatin, hearing improved Nose: Nose normal.  Mouth/Throat: Oropharynx is clear and moist.  Eyes: Conjunctivae and EOM are normal. Pupils are equal, round, and reactive to light.  Neck: Normal range of motion. Neck supple. No JVD present. No tracheal deviation present.  Cardiovascular: Normal rate, regular rhythm, normal heart sounds and intact distal pulses.   Pulmonary/Chest: Effort normal and breath sounds normal.  Abdominal: Soft. Bowel sounds are normal. There is no tenderness. No HSM  Musculoskeletal: Normal range of motion. Exhibits no edema.  Lymphadenopathy:  Has no cervical adenopathy.  Neurological: Pt is alert and oriented to person, place, and time. Pt has normal reflexes. No cranial nerve deficit.  Skin: Skin is warm and dry. No rash noted.  Psychiatric:  Has  normal mood and affect. Behavior is normal.  Bilat hands diffuse mild swelling/tender o/w neurovasc intact    Assessment & Plan:

## 2012-08-07 NOTE — Assessment & Plan Note (Signed)
Quit jan 2014

## 2012-08-07 NOTE — Assessment & Plan Note (Signed)
For f/u tsh, encouraged f/u with endo

## 2012-08-07 NOTE — Assessment & Plan Note (Signed)
stable overall by history and exam, recent data reviewed with pt, and pt to continue medical treatment as before,  to f/u any worsening symptoms or concerns BP Readings from Last 3 Encounters:  08/07/12 150/90  07/07/12 122/80  09/27/11 122/86

## 2012-08-07 NOTE — Assessment & Plan Note (Signed)

## 2012-08-07 NOTE — Patient Instructions (Addendum)
Your left ear was irrigated today Your EKG was OK today Please continue all other medications as before, and refills have been done if requested (the lipitor) Good job quitting smoking! Please continue your efforts at being more active, low cholesterol diet, and weight control. Please take all new medication as prescribed - the prednisone Please call for referral to hand surgury if your hands do not improve You are otherwise up to date with prevention measures today. Please go to the LAB in the Basement (turn left off the elevator) for the tests to be done today You will be contacted by phone if any changes need to be made immediately.  Otherwise, you will receive a letter about your results with an explanation, but please check with MyChart first. We may need to have you see endocrinology again if the thyroid test is still abnormal Thank you for enrolling in MyChart. Please follow the instructions below to securely access your online medical record. MyChart allows you to send messages to your doctor, view your test results, renew your prescriptions, schedule appointments, and more. To Log into My Chart online, please go by [employer] or [employer] to [email or web address], or download the MyChart App from the [employer] of [employer] .  Your Username is: [email or web address] (pass assad) Please send a practice Message on Mychart later today. Please return in 1 year for your yearly visit, or sooner if needed, with Lab testing done 3-5 days before

## 2012-08-10 ENCOUNTER — Other Ambulatory Visit: Payer: Self-pay | Admitting: Internal Medicine

## 2012-08-10 ENCOUNTER — Encounter: Payer: Self-pay | Admitting: Internal Medicine

## 2012-08-10 MED ORDER — PREDNISONE 10 MG PO TABS: ORAL_TABLET | ORAL | Status: DC

## 2012-08-10 NOTE — Telephone Encounter (Signed)
I do not see prednisone on her med list. Was this suppose to be sent in?

## 2012-08-19 ENCOUNTER — Encounter: Payer: Self-pay | Admitting: Internal Medicine

## 2012-08-21 ENCOUNTER — Encounter: Payer: Self-pay | Admitting: Internal Medicine

## 2012-08-21 ENCOUNTER — Ambulatory Visit (INDEPENDENT_AMBULATORY_CARE_PROVIDER_SITE_OTHER): Payer: BC Managed Care – PPO | Admitting: Internal Medicine

## 2012-08-21 VITALS — BP 152/98 | HR 88 | Temp 98.0°F | Ht 63.0 in | Wt 180.0 lb

## 2012-08-21 DIAGNOSIS — IMO0001 Reserved for inherently not codable concepts without codable children: Secondary | ICD-10-CM

## 2012-08-21 DIAGNOSIS — I10 Essential (primary) hypertension: Secondary | ICD-10-CM

## 2012-08-21 DIAGNOSIS — E785 Hyperlipidemia, unspecified: Secondary | ICD-10-CM

## 2012-08-21 MED ORDER — AMLODIPINE BESYLATE 10 MG PO TABS: 10.0000 mg | ORAL_TABLET | Freq: Every day | ORAL | Status: DC

## 2012-08-21 MED ORDER — IRBESARTAN 150 MG PO TABS: 150.0000 mg | ORAL_TABLET | Freq: Every day | ORAL | Status: DC

## 2012-08-21 NOTE — Progress Notes (Signed)
Subjective:    Patient ID: Erica Mills, female    DOB: [birth year], 57 y.o.   MRN: [id number]  HPI  Here to f/u; overall doing ok,  Pt denies chest pain, increased sob or doe, wheezing, orthopnea, PND, increased LE swelling, palpitations, dizziness or syncope.  Pt denies polydipsia, polyuria, or low sugar symptoms such as weakness or confusion improved with po intake.  Pt denies new neurological symptoms such as new  facial or extremity weakness or numbness, but has had vague diffuse headache and wooziness.   Pt states overall good compliance with meds including the cardizem, has been trying to follow lower salt, lower cholesterol diet with wt overall stable. / Pt denies fever, wt loss, night sweats, loss of appetite, or other constitutional symptoms.  Has marked stressors recently, works 10 hr days, asks for time off work to adjust to med, rest Past Medical History  Diagnosis Date  . ACHILLES TENDINITIS 10/06/2009  . ALLERGIC RHINITIS 05/19/2007  . CARPAL TUNNEL SYNDROME, BILATERAL 05/19/2007  . ELBOW PAIN, RIGHT 04/21/2008  . GLUCOSE INTOLERANCE 02/14/2009  . HYPERLIPIDEMIA 05/19/2007  . HYPERTENSION 05/19/2007  . HYPERTHYROIDISM 03/10/2009  . Lateral epicondylitis  of elbow 04/21/2008  . SINUSITIS- ACUTE-NOS 05/11/2010  . SMOKER 05/19/2007  . Fibroids   . H/O menorrhagia   . Cyst of breast, right, solitary 07/2012   Past Surgical History  Procedure Laterality Date  . Tonsillectomy    . Cesarean section      reports that she has quit smoking. She has never used smokeless tobacco. She reports that she does not drink alcohol or use illicit drugs. family history includes Cancer in her paternal uncle; Diabetes in her father, mother, and sister; and Hypertension in her father and mother. Allergies  Allergen Reactions  . Sulfonamide Derivatives     REACTION: rash   Current Outpatient Prescriptions on File Prior to Visit  Medication Sig Dispense Refill  . aspirin 81 MG tablet Take 81 mg by mouth  daily.        Marland Kitchen atorvastatin (LIPITOR) 20 MG tablet Take 1 tablet (20 mg total) by mouth daily.  90 tablet  3  . varenicline (CHANTIX CONTINUING MONTH PAK) 1 MG tablet Take 1 tablet (1 mg total) by mouth 2 (two) times daily.  60 tablet  2  . benzonatate (TESSALON) 100 MG capsule TAKE 1-2 CAPSULES BY MOUTH THREE TIMES PER DAY AS NEEDED FOR COUGH  90 capsule  0  . fexofenadine (ALLEGRA) 180 MG tablet Take 1 tablet (180 mg total) by mouth daily.  30 tablet  2  . fluconazole (DIFLUCAN) 150 MG tablet Take 1 tablet (150 mg total) by mouth once.  1 tablet  1  . fluticasone (FLONASE) 50 MCG/ACT nasal spray Place 2 sprays into the nose daily.  16 g  5  . naproxen (NAPROSYN) 500 MG tablet Take 1 tablet (500 mg total) by mouth 2 (two) times daily.  180 tablet  3  . predniSONE (DELTASONE) 10 MG tablet 3 tabs by mouth per day for 3 days,2tabs per day for 3 days,1tab per day for 3 days  18 tablet  0  . sertraline (ZOLOFT) 50 MG tablet Take 1 tablet (50 mg total) by mouth daily.  90 tablet  3   No current facility-administered medications on file prior to visit.   Review of Systems  Constitutional: Negative for unexpected weight change, or unusual diaphoresis  HENT: Negative for tinnitus.   Eyes: Negative for photophobia and visual disturbance.  Respiratory: Negative for choking and stridor.   Gastrointestinal: Negative for vomiting and blood in stool.  Genitourinary: Negative for hematuria and decreased urine volume.  Musculoskeletal: Negative for acute joint swelling Skin: Negative for color change and wound.  Neurological: Negative for tremors and numbness other than noted  Psychiatric/Behavioral: Negative for decreased concentration or  hyperactivity.       Objective:   Physical Exam BP 152/98  Pulse 88  Temp(Src) 98 F (36.7 C) (Oral)  Ht 5\' 3"  (1.6 m)  Wt 180 lb (81.647 kg)  BMI 31.89 kg/m2  SpO2 98% VS noted,  Constitutional: Pt appears well-developed and well-nourished.  HENT: Head:  NCAT.  Right Ear: External ear normal.  Left Ear: External ear normal.  Eyes: Conjunctivae and EOM are normal. Pupils are equal, round, and reactive to light.  Neck: Normal range of motion. Neck supple.  Cardiovascular: Normal rate and regular rhythm.   Pulmonary/Chest: Effort normal and breath sounds normal.  Abd:  Soft, NT, non-distended, + BS Neurological: Pt is alert. Not confused , cn 2-12 intact, motor/gait intact Skin: Skin is warm. No erythema. No LE edema  Psychiatric: Pt behavior is normal. Thought content normal.  Mild nervous    Assessment & Plan:

## 2012-08-21 NOTE — Assessment & Plan Note (Signed)
Mild uncontrolled, to d/c cardizem, start amlod 10, and avapro 150, f/u 3 wks,  to f/u any worsening symptoms or concerns

## 2012-08-21 NOTE — Patient Instructions (Signed)
OK to stop the diltiazem Please start amlodipine 10 mg per day, and the generic for Avapro 150 mg per day. Please continue all other medications as before, and refills have been done if requested. Please have the pharmacy call with any other refills you may need. Please continue your efforts at being more active, low cholesterol diet, and weight control.

## 2012-08-21 NOTE — Assessment & Plan Note (Signed)
Lab Results  Component Value Date   HGBA1C 6.7* 08/07/2012   stable overall by history and exam, recent data reviewed with pt, and pt to continue medical treatment as before,  to f/u any worsening symptoms or concerns

## 2012-08-21 NOTE — Assessment & Plan Note (Signed)
Lab Results  Component Value Date   LDLCALC 110* 08/07/2012   D/w , needs to follow lower chol diet, consider further statin but declines at this time

## 2012-09-22 ENCOUNTER — Other Ambulatory Visit: Payer: Self-pay | Admitting: Internal Medicine

## 2012-10-01 ENCOUNTER — Telehealth: Payer: Self-pay

## 2012-10-01 MED ORDER — ATORVASTATIN CALCIUM 20 MG PO TABS: 20.0000 mg | ORAL_TABLET | Freq: Every day | ORAL | Status: DC

## 2012-10-01 NOTE — Telephone Encounter (Signed)
refill 

## 2012-10-30 ENCOUNTER — Ambulatory Visit: Payer: BC Managed Care – PPO | Admitting: Internal Medicine

## 2012-11-06 ENCOUNTER — Encounter: Payer: Self-pay | Admitting: Internal Medicine

## 2012-11-06 ENCOUNTER — Ambulatory Visit (INDEPENDENT_AMBULATORY_CARE_PROVIDER_SITE_OTHER): Payer: BC Managed Care – PPO | Admitting: Internal Medicine

## 2012-11-06 VITALS — BP 122/82 | HR 93 | Temp 97.7°F | Ht 63.0 in | Wt 188.1 lb

## 2012-11-06 DIAGNOSIS — R7989 Other specified abnormal findings of blood chemistry: Secondary | ICD-10-CM

## 2012-11-06 DIAGNOSIS — Z0000 Encounter for general adult medical examination without abnormal findings: Secondary | ICD-10-CM

## 2012-11-06 DIAGNOSIS — I10 Essential (primary) hypertension: Secondary | ICD-10-CM

## 2012-11-06 DIAGNOSIS — J309 Allergic rhinitis, unspecified: Secondary | ICD-10-CM

## 2012-11-06 DIAGNOSIS — R6889 Other general symptoms and signs: Secondary | ICD-10-CM

## 2012-11-06 DIAGNOSIS — IMO0001 Reserved for inherently not codable concepts without codable children: Secondary | ICD-10-CM

## 2012-11-06 DIAGNOSIS — E785 Hyperlipidemia, unspecified: Secondary | ICD-10-CM

## 2012-11-06 MED ORDER — IRBESARTAN 150 MG PO TABS: 150.0000 mg | ORAL_TABLET | Freq: Every day | ORAL | Status: DC

## 2012-11-06 MED ORDER — FLUTICASONE PROPIONATE 50 MCG/ACT NA SUSP: 2.0000 | Freq: Every day | NASAL | Status: DC

## 2012-11-06 NOTE — Addendum Note (Signed)
Addended by: Corwin Levins on: 11/06/2012 09:45 AM   Modules accepted: Orders

## 2012-11-06 NOTE — Patient Instructions (Addendum)
Please take all new medication as prescribed - the flonase for the allergies Please see the [facility] to help schedule the referral to Endocrinology for the thyroid Please continue all other medications as before, and it is OK to take both Blood Pressure medications in the morning Please have the pharmacy call with any other refills you may need. Please return in 9 months, or sooner if needed, with Lab testing done 3-5 days before

## 2012-11-06 NOTE — Assessment & Plan Note (Signed)
Improved,  to f/u any worsening symptoms or concerns BP Readings from Last 3 Encounters:  11/06/12 122/82  08/21/12 152/98  08/07/12 150/90

## 2012-11-06 NOTE — Progress Notes (Signed)
Subjective:    Patient ID: Erica Mills, female    DOB: [birth year], 57 y.o.   MRN: [id number]  HPI Here to f/u; overall doing ok,  Pt denies chest pain, increased sob or doe, wheezing, orthopnea, PND, increased LE swelling, palpitations, dizziness or syncope.  Pt denies polydipsia, polyuria, or low sugar symptoms such as weakness or confusion improved with po intake.  Pt denies new neurological symptoms such as new headache, or facial or extremity weakness or numbness.   Pt states overall good compliance with meds, has been trying to follow lower cholesterol diet, with wt overall stable,  but little exercise however.  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  Also getting weighed once per wk, and now more able to keep active to keep her wt down.She has been walking and more [employer] [place].  Does have several wks ongoing nasal allergy symptoms with clearish congestion, itch and sneezing, without fever, pain, ST, cough, swelling or wheezing.  Past Medical History  Diagnosis Date  . ACHILLES TENDINITIS 10/06/2009  . ALLERGIC RHINITIS 05/19/2007  . CARPAL TUNNEL SYNDROME, BILATERAL 05/19/2007  . ELBOW PAIN, RIGHT 04/21/2008  . GLUCOSE INTOLERANCE 02/14/2009  . HYPERLIPIDEMIA 05/19/2007  . HYPERTENSION 05/19/2007  . HYPERTHYROIDISM 03/10/2009  . Lateral epicondylitis  of elbow 04/21/2008  . SINUSITIS- ACUTE-NOS 05/11/2010  . SMOKER 05/19/2007  . Fibroids   . H/O menorrhagia   . Cyst of breast, right, solitary 07/2012   Past Surgical History  Procedure Laterality Date  . Tonsillectomy    . Cesarean section      reports that she has quit smoking. She has never used smokeless tobacco. She reports that she does not drink alcohol or use illicit drugs. family history includes Cancer in her paternal uncle; Diabetes in her father, mother, and sister; and Hypertension in her father and mother. Allergies  Allergen Reactions  . Sulfonamide Derivatives     REACTION: rash    Current Outpatient Prescriptions on File Prior to Visit  Medication Sig Dispense Refill  . amLODipine (NORVASC) 10 MG tablet Take 1 tablet (10 mg total) by mouth daily.  90 tablet  3  . atorvastatin (LIPITOR) 20 MG tablet Take 1 tablet (20 mg total) by mouth daily.  90 tablet  3  . naproxen (NAPROSYN) 500 MG tablet TAKE 1 TABLET (500 MG TOTAL) BY MOUTH 2 (TWO) TIMES DAILY.  180 tablet  3  . varenicline (CHANTIX CONTINUING MONTH PAK) 1 MG tablet Take 1 tablet (1 mg total) by mouth 2 (two) times daily.  60 tablet  2  . aspirin 81 MG tablet Take 81 mg by mouth daily.        . fexofenadine (ALLEGRA) 180 MG tablet Take 1 tablet (180 mg total) by mouth daily.  30 tablet  2  . fluticasone (FLONASE) 50 MCG/ACT nasal spray Place 2 sprays into the nose daily.  16 g  5  . sertraline (ZOLOFT) 50 MG tablet Take 1 tablet (50 mg total) by mouth daily.  90 tablet  3   No current facility-administered medications on file prior to visit.   Review of Systems  Constitutional: Negative for unexpected weight change, or unusual diaphoresis  HENT: Negative for tinnitus.   Eyes: Negative for photophobia and visual disturbance.  Respiratory: Negative for choking and stridor.   Gastrointestinal: Negative for vomiting and blood in stool.  Genitourinary: Negative for hematuria and decreased urine volume.  Musculoskeletal: Negative for acute joint swelling  Skin: Negative for color change and wound.  Neurological: Negative for tremors and numbness other than noted  Psychiatric/Behavioral: Negative for decreased concentration or  hyperactivity.       Objective:   Physical Exam BP 122/82  Pulse 93  Temp(Src) 97.7 F (36.5 C) (Oral)  Ht 5\' 3"  (1.6 m)  Wt 188 lb 2 oz (85.333 kg)  BMI 33.33 kg/m2  SpO2 97% VS noted,  Constitutional: Pt appears well-developed and well-nourished.  HENT: Head: NCAT.  Right Ear: External ear normal.  Left Ear: External ear normal.  Bilat tm's with mild erythema.  Max sinus  areas mild tender.  Pharynx with mild erythema, no exudate Eyes: Conjunctivae and EOM are normal. Pupils are equal, round, and reactive to light.  Neck: Normal range of motion. Neck supple.  Cardiovascular: Normal rate and regular rhythm.   Pulmonary/Chest: Effort normal and breath sounds normal.  Neurological: Pt is alert. Not confused  Skin: Skin is warm. No erythema.  Psychiatric: Pt behavior is normal. Thought content normal.         Assessment & Plan:

## 2012-11-06 NOTE — Assessment & Plan Note (Signed)
stable overall by history and exam, recent data reviewed with pt, and pt to continue medical treatment as before,  to f/u any worsening symptoms or concerns Lab Results  Component Value Date   LDLCALC 110* 08/07/2012   For lower chol diet

## 2012-11-06 NOTE — Assessment & Plan Note (Signed)
Mild to mod, for flonase restart,  to f/u any worsening symptoms or concerns 

## 2012-11-06 NOTE — Assessment & Plan Note (Signed)
Some communication problem recent, for endo referall, to see pcc before leaving today

## 2012-11-06 NOTE — Assessment & Plan Note (Signed)
stable overall by history and exam, recent data reviewed with pt, and pt to continue medical treatment as before,  to f/u any worsening symptoms or concerns Lab Results  Component Value Date   HGBA1C 6.7* 08/07/2012

## 2012-11-13 ENCOUNTER — Encounter: Payer: Self-pay | Admitting: Endocrinology

## 2012-11-13 ENCOUNTER — Ambulatory Visit (INDEPENDENT_AMBULATORY_CARE_PROVIDER_SITE_OTHER): Payer: BC Managed Care – PPO | Admitting: Endocrinology

## 2012-11-13 VITALS — BP 124/74 | HR 90 | Ht 64.0 in | Wt 185.0 lb

## 2012-11-13 DIAGNOSIS — E0590 Thyrotoxicosis, unspecified without thyrotoxic crisis or storm: Secondary | ICD-10-CM

## 2012-11-13 LAB — T4, FREE: Free T4: 0.55 ng/dL — ABNORMAL LOW (ref 0.60–1.60)

## 2012-11-13 LAB — TSH: TSH: 0.1 u[IU]/mL — ABNORMAL LOW (ref 0.35–5.50)

## 2012-11-13 NOTE — Patient Instructions (Addendum)
Let's recheck the thyroid blood tests.  We'll contact you with results. If it is overactive, we should pursue the radioactive iodine again.  Let's also check the ultrasound.  you will receive a phone call, about a day and time for an appointment.

## 2012-11-13 NOTE — Progress Notes (Signed)
Subjective:    Patient ID: Erica Mills, female    DOB: 1956-05-26, 57 y.o.   MRN: [id number]  HPI Pt has 4 years h/o multinodular goiter, with associated hyperthyroidism, varying from mild to moderate. Scan in 2010 showed low uptake. Abnormal tsh resolved in 2011, but it recurred in 2013. She had nuc med scan (showed normal uptake, and multinodular pattern), in preparation for i-131.  However, she not get i-131, as she did not have insurance.  states she feels well in general.  She denies weight change.  She has quit smoking.   Past Medical History  Diagnosis Date  . ACHILLES TENDINITIS 10/06/2009  . ALLERGIC RHINITIS 05/19/2007  . CARPAL TUNNEL SYNDROME, BILATERAL 05/19/2007  . ELBOW PAIN, RIGHT 04/21/2008  . GLUCOSE INTOLERANCE 02/14/2009  . HYPERLIPIDEMIA 05/19/2007  . HYPERTENSION 05/19/2007  . HYPERTHYROIDISM 03/10/2009  . Lateral epicondylitis  of elbow 04/21/2008  . SINUSITIS- ACUTE-NOS 05/11/2010  . SMOKER 05/19/2007  . Fibroids   . H/O menorrhagia   . Cyst of breast, right, solitary 07/2012    Past Surgical History  Procedure Laterality Date  . Tonsillectomy    . Cesarean section      History   Social History  . Marital Status: Married    Spouse Name: N/A    Number of Children: N/A  . Years of Education: N/A   Occupational History  . Not on file.   Social History Main Topics  . Smoking status: Former Games developer  . Smokeless tobacco: Never Used     Comment: QUIT SMOKING X 4 WEEKS  . Alcohol Use: No  . Drug Use: No  . Sexually Active: Yes    Birth Control/ Protection: Surgical, Post-menopausal     Comment: BTL   Other Topics Concern  . Not on file   Social History Narrative  . No narrative on file    Current Outpatient Prescriptions on File Prior to Visit  Medication Sig Dispense Refill  . amLODipine (NORVASC) 10 MG tablet Take 1 tablet (10 mg total) by mouth daily.  90 tablet  3  . aspirin 81 MG tablet Take 81 mg by mouth daily.        Marland Kitchen atorvastatin (LIPITOR)  20 MG tablet Take 1 tablet (20 mg total) by mouth daily.  90 tablet  3  . fluticasone (FLONASE) 50 MCG/ACT nasal spray Place 2 sprays into the nose daily.  16 g  5  . irbesartan (AVAPRO) 150 MG tablet Take 1 tablet (150 mg total) by mouth daily.  90 tablet  3  . naproxen (NAPROSYN) 500 MG tablet TAKE 1 TABLET (500 MG TOTAL) BY MOUTH 2 (TWO) TIMES DAILY.  180 tablet  3  . varenicline (CHANTIX CONTINUING MONTH PAK) 1 MG tablet Take 1 tablet (1 mg total) by mouth 2 (two) times daily.  60 tablet  2  . fexofenadine (ALLEGRA) 180 MG tablet Take 1 tablet (180 mg total) by mouth daily.  30 tablet  2  . sertraline (ZOLOFT) 50 MG tablet Take 1 tablet (50 mg total) by mouth daily.  90 tablet  3   No current facility-administered medications on file prior to visit.    Allergies  Allergen Reactions  . Sulfonamide Derivatives     REACTION: rash   Family History  Problem Relation Age of Onset  . Hypertension Mother   . Diabetes Mother   . Diabetes Father   . Hypertension Father   . Cancer Paternal Uncle     colon cancer  .  Diabetes Sister    BP 124/74  Pulse 90  Ht 5\' 4"  (1.626 m)  Wt 185 lb (83.915 kg)  BMI 31.74 kg/m2  SpO2 97%  Review of Systems Denies neck pain and tremor    Objective:   Physical Exam VITAL SIGNS:  See vs page GENERAL: no distress 2 thyroid nodules are easily palpable:  2 cm right, and 1 cm left.  Lab Results  Component Value Date   TSH 0.10* 11/13/2012      Assessment & Plan:  Multinodular goiter, clinically unchanged Hyperthyroidism: this pattern of TFT could be due to "T3 hyperthyroidism," or due to rapidly changing thyroid function.  A recheck in a few weeks will help decide.   Financial problems.  These have limited rx of this condition, but pt says better now.

## 2012-11-14 ENCOUNTER — Other Ambulatory Visit: Payer: Self-pay | Admitting: Endocrinology

## 2012-11-14 DIAGNOSIS — E0590 Thyrotoxicosis, unspecified without thyrotoxic crisis or storm: Secondary | ICD-10-CM

## 2012-11-20 ENCOUNTER — Ambulatory Visit
Admission: RE | Admit: 2012-11-20 | Discharge: 2012-11-20 | Disposition: A | Discharge status: Home / Self Care | Payer: BC Managed Care – PPO | Source: Ambulatory Visit | Attending: Endocrinology | Admitting: Endocrinology

## 2012-12-04 ENCOUNTER — Ambulatory Visit: Payer: BC Managed Care – PPO

## 2012-12-04 ENCOUNTER — Other Ambulatory Visit: Payer: Self-pay | Admitting: Endocrinology

## 2012-12-04 DIAGNOSIS — E0590 Thyrotoxicosis, unspecified without thyrotoxic crisis or storm: Secondary | ICD-10-CM

## 2012-12-04 DIAGNOSIS — E042 Nontoxic multinodular goiter: Secondary | ICD-10-CM

## 2012-12-04 LAB — T4, FREE: Free T4: 0.58 ng/dL — ABNORMAL LOW (ref 0.60–1.60)

## 2012-12-04 LAB — TSH: TSH: 0.1 u[IU]/mL — ABNORMAL LOW (ref 0.35–5.50)

## 2012-12-06 ENCOUNTER — Encounter: Payer: Self-pay | Admitting: Endocrinology

## 2012-12-09 NOTE — Telephone Encounter (Signed)
Please send this to pcc

## 2012-12-10 NOTE — Telephone Encounter (Signed)
Please ask pcc what is the progress on this?

## 2012-12-15 NOTE — Telephone Encounter (Signed)
Tried to call patient x 1 for appt with Dr. Everardo All. Phone only rang and rang. No answer. Will try to call again later / Sherri

## 2012-12-17 ENCOUNTER — Encounter: Payer: Self-pay | Admitting: Internal Medicine

## 2012-12-21 ENCOUNTER — Encounter (HOSPITAL_COMMUNITY)
Admission: RE | Admit: 2012-12-21 | Discharge: 2012-12-21 | Disposition: A | Discharge status: Home / Self Care | Payer: BC Managed Care – PPO | Source: Ambulatory Visit | Attending: Endocrinology | Admitting: Endocrinology

## 2012-12-21 ENCOUNTER — Encounter: Payer: Self-pay | Admitting: Endocrinology

## 2012-12-21 DIAGNOSIS — E042 Nontoxic multinodular goiter: Secondary | ICD-10-CM

## 2012-12-22 ENCOUNTER — Other Ambulatory Visit: Payer: Self-pay | Admitting: Endocrinology

## 2012-12-22 ENCOUNTER — Encounter (HOSPITAL_COMMUNITY)
Admission: RE | Admit: 2012-12-22 | Discharge: 2012-12-22 | Disposition: A | Discharge status: Home / Self Care | Payer: BC Managed Care – PPO | Source: Ambulatory Visit | Attending: Endocrinology | Admitting: Endocrinology

## 2012-12-22 DIAGNOSIS — E0590 Thyrotoxicosis, unspecified without thyrotoxic crisis or storm: Secondary | ICD-10-CM

## 2012-12-22 MED ORDER — SODIUM IODIDE I 131 CAPSULE
10.9000 | Freq: Once | INTRAVENOUS | Status: AC | PRN
  Administered 2012-12-21: 10.9 via ORAL

## 2012-12-22 MED ORDER — SODIUM PERTECHNETATE TC 99M INJECTION
9.5000 | Freq: Once | INTRAVENOUS | Status: AC | PRN
  Administered 2012-12-22: 10 via INTRAVENOUS

## 2013-01-08 ENCOUNTER — Encounter (HOSPITAL_COMMUNITY)
Admission: RE | Admit: 2013-01-08 | Discharge: 2013-01-08 | Disposition: A | Discharge status: Home / Self Care | Payer: BC Managed Care – PPO | Source: Ambulatory Visit | Attending: Endocrinology | Admitting: Endocrinology

## 2013-01-08 DIAGNOSIS — E0590 Thyrotoxicosis, unspecified without thyrotoxic crisis or storm: Secondary | ICD-10-CM | POA: Insufficient documentation

## 2013-01-08 MED ORDER — SODIUM IODIDE I 131 CAPSULE
33.0000 | Freq: Once | INTRAVENOUS | Status: AC | PRN
  Administered 2013-01-08: 33 via ORAL

## 2013-02-09 ENCOUNTER — Ambulatory Visit: Payer: BC Managed Care – PPO | Admitting: Internal Medicine

## 2013-03-05 ENCOUNTER — Encounter: Payer: Self-pay | Admitting: Endocrinology

## 2013-03-05 ENCOUNTER — Ambulatory Visit (INDEPENDENT_AMBULATORY_CARE_PROVIDER_SITE_OTHER): Payer: BC Managed Care – PPO | Admitting: Endocrinology

## 2013-03-05 VITALS — BP 140/78 | HR 80 | Ht 63.0 in | Wt 188.0 lb

## 2013-03-05 DIAGNOSIS — E0590 Thyrotoxicosis, unspecified without thyrotoxic crisis or storm: Secondary | ICD-10-CM

## 2013-03-05 LAB — T4, FREE: Free T4: 0.54 ng/dL — ABNORMAL LOW (ref 0.60–1.60)

## 2013-03-05 LAB — TSH: TSH: 0.18 u[IU]/mL — ABNORMAL LOW (ref 0.35–5.50)

## 2013-03-05 NOTE — Progress Notes (Signed)
Subjective:    Patient ID: Erica Mills, female    DOB: [birth year], 57 y.o.   MRN: [id number]  HPI Pt returns for f/u of hyperthyroidism due to multinodular goiter (dx'ed 2010). Scan in 2010 showed low uptake. Abnormal tsh resolved in 2011, but it recurred in 2013. She had nuc med scan (showed normal uptake, and multinodular pattern), in preparation for i-131.  However, she not get i-131, as she did not have insurance.  She had i-131 rx in July of 2013.  Since then, pt states she feels no different, and well in general. Past Medical History  Diagnosis Date  . ACHILLES TENDINITIS 10/06/2009  . ALLERGIC RHINITIS 05/19/2007  . CARPAL TUNNEL SYNDROME, BILATERAL 05/19/2007  . ELBOW PAIN, RIGHT 04/21/2008  . GLUCOSE INTOLERANCE 02/14/2009  . HYPERLIPIDEMIA 05/19/2007  . HYPERTENSION 05/19/2007  . HYPERTHYROIDISM 03/10/2009  . Lateral epicondylitis  of elbow 04/21/2008  . SINUSITIS- ACUTE-NOS 05/11/2010  . SMOKER 05/19/2007  . Fibroids   . H/O menorrhagia   . Cyst of breast, right, solitary 07/2012    Past Surgical History  Procedure Laterality Date  . Tonsillectomy    . Cesarean section      History   Social History  . Marital Status: Married    Spouse Name: N/A    Number of Children: N/A  . Years of Education: N/A   Occupational History  . Not on file.   Social History Main Topics  . Smoking status: Former Games developer  . Smokeless tobacco: Never Used     Comment: QUIT SMOKING X 4 WEEKS  . Alcohol Use: No  . Drug Use: No  . Sexual Activity: Yes    Birth Control/ Protection: Surgical, Post-menopausal     Comment: BTL   Other Topics Concern  . Not on file   Social History Narrative  . No narrative on file    Current Outpatient Prescriptions on File Prior to Visit  Medication Sig Dispense Refill  . amLODipine (NORVASC) 10 MG tablet Take 1 tablet (10 mg total) by mouth daily.  90 tablet  3  . aspirin 81 MG tablet Take 81 mg by mouth daily.        Marland Kitchen atorvastatin (LIPITOR) 20 MG  tablet Take 1 tablet (20 mg total) by mouth daily.  90 tablet  3  . fluticasone (FLONASE) 50 MCG/ACT nasal spray Place 2 sprays into the nose daily.  16 g  5  . irbesartan (AVAPRO) 150 MG tablet Take 1 tablet (150 mg total) by mouth daily.  90 tablet  3  . naproxen (NAPROSYN) 500 MG tablet TAKE 1 TABLET (500 MG TOTAL) BY MOUTH 2 (TWO) TIMES DAILY.  180 tablet  3  . varenicline (CHANTIX CONTINUING MONTH PAK) 1 MG tablet Take 1 tablet (1 mg total) by mouth 2 (two) times daily.  60 tablet  2  . fexofenadine (ALLEGRA) 180 MG tablet Take 1 tablet (180 mg total) by mouth daily.  30 tablet  2  . sertraline (ZOLOFT) 50 MG tablet Take 1 tablet (50 mg total) by mouth daily.  90 tablet  3   No current facility-administered medications on file prior to visit.    Allergies  Allergen Reactions  . Sulfonamide Derivatives     REACTION: rash    Family History  Problem Relation Age of Onset  . Hypertension Mother   . Diabetes Mother   . Diabetes Father   . Hypertension Father   . Cancer Paternal Uncle     colon  cancer  . Diabetes Sister     BP 140/78  Pulse 80  Ht 5\' 3"  (1.6 m)  Wt 188 lb (85.276 kg)  BMI 33.31 kg/m2  SpO2 96%   Review of Systems Denies neck pain    Objective:   Physical Exam VITAL SIGNS:  See vs page GENERAL: no distress 2 thyroid nodules are again easily palpable:  3 cm right, and 1 cm left.  Lab Results  Component Value Date   TSH 0.18* 03/05/2013      Assessment & Plan:  Multinodular goiter, clinically unchanged Hyperthyroidism, persistent so far.

## 2013-03-05 NOTE — Patient Instructions (Addendum)
Let's recheck the thyroid blood tests.  We'll contact you with results. Please come back for a follow-up appointment in 6 weeks

## 2013-04-15 ENCOUNTER — Other Ambulatory Visit: Payer: Self-pay

## 2013-04-16 ENCOUNTER — Ambulatory Visit (INDEPENDENT_AMBULATORY_CARE_PROVIDER_SITE_OTHER): Payer: BC Managed Care – PPO | Admitting: Endocrinology

## 2013-04-16 ENCOUNTER — Encounter: Payer: Self-pay | Admitting: Endocrinology

## 2013-04-16 VITALS — BP 120/84 | HR 87 | Temp 98.4°F | Ht 63.0 in | Wt 186.7 lb

## 2013-04-16 DIAGNOSIS — E0590 Thyrotoxicosis, unspecified without thyrotoxic crisis or storm: Secondary | ICD-10-CM

## 2013-04-16 LAB — T4, FREE: Free T4: 0.58 ng/dL — ABNORMAL LOW (ref 0.60–1.60)

## 2013-04-16 NOTE — Patient Instructions (Signed)
Let's recheck the thyroid blood tests.  We'll contact you with results. Please come back for a follow-up appointment in 4-6 weeks.

## 2013-04-16 NOTE — Progress Notes (Signed)
Subjective:    Patient ID: Erica Mills, female    DOB: [birth year], 57 y.o.   MRN: [id number]  HPI Pt returns for f/u of hyperthyroidism due to multinodular goiter (dx'ed 2010; scan in 2010 showed low uptake; abnormal tsh resolved in 2011, but it recurred in 2013; she had nuc med scan (showed normal uptake, and multinodular pattern), in preparation for i-131; however, she not get i-131, as she did not have insurance; she eventually had i-131 rx in august  of 2014).  Since then, pt states she feels no different, and well in general. Past Medical History  Diagnosis Date  . ACHILLES TENDINITIS 10/06/2009  . ALLERGIC RHINITIS 05/19/2007  . CARPAL TUNNEL SYNDROME, BILATERAL 05/19/2007  . ELBOW PAIN, RIGHT 04/21/2008  . GLUCOSE INTOLERANCE 02/14/2009  . HYPERLIPIDEMIA 05/19/2007  . HYPERTENSION 05/19/2007  . HYPERTHYROIDISM 03/10/2009  . Lateral epicondylitis  of elbow 04/21/2008  . SINUSITIS- ACUTE-NOS 05/11/2010  . SMOKER 05/19/2007  . Fibroids   . H/O menorrhagia   . Cyst of breast, right, solitary 07/2012    Past Surgical History  Procedure Laterality Date  . Tonsillectomy    . Cesarean section      History   Social History  . Marital Status: Married    Spouse Name: N/A    Number of Children: N/A  . Years of Education: N/A   Occupational History  . Not on file.   Social History Main Topics  . Smoking status: Former Games developer  . Smokeless tobacco: Never Used     Comment: QUIT SMOKING X 4 WEEKS  . Alcohol Use: No  . Drug Use: No  . Sexual Activity: Yes    Birth Control/ Protection: Surgical, Post-menopausal     Comment: BTL   Other Topics Concern  . Not on file   Social History Narrative  . No narrative on file    Current Outpatient Prescriptions on File Prior to Visit  Medication Sig Dispense Refill  . amLODipine (NORVASC) 10 MG tablet Take 1 tablet (10 mg total) by mouth daily.  90 tablet  3  . aspirin 81 MG tablet Take 81 mg by mouth daily.        Marland Kitchen atorvastatin  (LIPITOR) 20 MG tablet Take 1 tablet (20 mg total) by mouth daily.  90 tablet  3  . fexofenadine (ALLEGRA) 180 MG tablet Take 1 tablet (180 mg total) by mouth daily.  30 tablet  2  . fluticasone (FLONASE) 50 MCG/ACT nasal spray Place 2 sprays into the nose daily.  16 g  5  . irbesartan (AVAPRO) 150 MG tablet Take 1 tablet (150 mg total) by mouth daily.  90 tablet  3  . naproxen (NAPROSYN) 500 MG tablet TAKE 1 TABLET (500 MG TOTAL) BY MOUTH 2 (TWO) TIMES DAILY.  180 tablet  3  . sertraline (ZOLOFT) 50 MG tablet Take 1 tablet (50 mg total) by mouth daily.  90 tablet  3  . varenicline (CHANTIX CONTINUING MONTH PAK) 1 MG tablet Take 1 tablet (1 mg total) by mouth 2 (two) times daily.  60 tablet  2   No current facility-administered medications on file prior to visit.    Allergies  Allergen Reactions  . Sulfonamide Derivatives     REACTION: rash    Family History  Problem Relation Age of Onset  . Hypertension Mother   . Diabetes Mother   . Diabetes Father   . Hypertension Father   . Cancer Paternal Uncle     colon  cancer  . Diabetes Sister     BP 120/84  Pulse 87  Temp(Src) 98.4 F (36.9 C) (Oral)  Ht 5\' 3"  (1.6 m)  Wt 186 lb 11.2 oz (84.687 kg)  BMI 33.08 kg/m2  SpO2 95%  Review of Systems Denies weight change and neck swelling    Objective:   Physical Exam VITAL SIGNS:  See vs page GENERAL: no distress 2 thyroid nodules are again easily palpable:  3 cm right, and 1 cm left.  Skin: not diaphoretic. Neuro: no tremor   Lab Results  Component Value Date   TSH 0.41 04/16/2013      Assessment & Plan:  Multinodular goiter, clinically unchanged Hyperthyroidism, better with i-131 rx

## 2013-05-21 ENCOUNTER — Encounter: Payer: Self-pay | Admitting: Endocrinology

## 2013-05-21 ENCOUNTER — Ambulatory Visit (INDEPENDENT_AMBULATORY_CARE_PROVIDER_SITE_OTHER): Payer: BC Managed Care – PPO | Admitting: Endocrinology

## 2013-05-21 VITALS — BP 110/62 | HR 90 | Temp 98.2°F | Wt 191.0 lb

## 2013-05-21 DIAGNOSIS — E0590 Thyrotoxicosis, unspecified without thyrotoxic crisis or storm: Secondary | ICD-10-CM

## 2013-05-21 LAB — TSH: TSH: 1.08 u[IU]/mL (ref 0.35–5.50)

## 2013-05-21 LAB — T4, FREE: Free T4: 0.55 ng/dL — ABNORMAL LOW (ref 0.60–1.60)

## 2013-05-21 NOTE — Progress Notes (Signed)
Subjective:    Patient ID: Erica Mills, female    DOB: 06/23/1955, 57 y.o.   MRN: [id number]  HPI Pt returns for f/u of hyperthyroidism due to multinodular goiter (dx'ed 2010; scan in 2010 showed low uptake; abnormal tsh resolved in 2011, but it recurred in 2013; she had nuc med scan (showed normal uptake, and multinodular pattern), in preparation for i-131; however, she not get i-131, as she did not have insurance; she eventually had i-131 rx in august  of 2014).  pt states she feels well in general, except for "hot flashes." she has weight gain. Past Medical History  Diagnosis Date  . ACHILLES TENDINITIS 10/06/2009  . ALLERGIC RHINITIS 05/19/2007  . CARPAL TUNNEL SYNDROME, BILATERAL 05/19/2007  . ELBOW PAIN, RIGHT 04/21/2008  . GLUCOSE INTOLERANCE 02/14/2009  . HYPERLIPIDEMIA 05/19/2007  . HYPERTENSION 05/19/2007  . HYPERTHYROIDISM 03/10/2009  . Lateral epicondylitis  of elbow 04/21/2008  . SINUSITIS- ACUTE-NOS 05/11/2010  . SMOKER 05/19/2007  . Fibroids   . H/O menorrhagia   . Cyst of breast, right, solitary 07/2012    Past Surgical History  Procedure Laterality Date  . Tonsillectomy    . Cesarean section      History   Social History  . Marital Status: Married    Spouse Name: N/A    Number of Children: N/A  . Years of Education: N/A   Occupational History  . Not on file.   Social History Main Topics  . Smoking status: Former Games developer  . Smokeless tobacco: Never Used     Comment: QUIT SMOKING X 4 WEEKS  . Alcohol Use: No  . Drug Use: No  . Sexual Activity: Yes    Birth Control/ Protection: Surgical, Post-menopausal     Comment: BTL   Other Topics Concern  . Not on file   Social History Narrative  . No narrative on file    Current Outpatient Prescriptions on File Prior to Visit  Medication Sig Dispense Refill  . amLODipine (NORVASC) 10 MG tablet Take 1 tablet (10 mg total) by mouth daily.  90 tablet  3  . aspirin 81 MG tablet Take 81 mg by mouth daily.        Marland Kitchen  atorvastatin (LIPITOR) 20 MG tablet Take 1 tablet (20 mg total) by mouth daily.  90 tablet  3  . fluticasone (FLONASE) 50 MCG/ACT nasal spray Place 2 sprays into the nose daily.  16 g  5  . irbesartan (AVAPRO) 150 MG tablet Take 1 tablet (150 mg total) by mouth daily.  90 tablet  3  . naproxen (NAPROSYN) 500 MG tablet TAKE 1 TABLET (500 MG TOTAL) BY MOUTH 2 (TWO) TIMES DAILY.  180 tablet  3  . varenicline (CHANTIX CONTINUING MONTH PAK) 1 MG tablet Take 1 tablet (1 mg total) by mouth 2 (two) times daily.  60 tablet  2  . fexofenadine (ALLEGRA) 180 MG tablet Take 1 tablet (180 mg total) by mouth daily.  30 tablet  2  . sertraline (ZOLOFT) 50 MG tablet Take 1 tablet (50 mg total) by mouth daily.  90 tablet  3   No current facility-administered medications on file prior to visit.   Allergies  Allergen Reactions  . Sulfonamide Derivatives     REACTION: rash   Family History  Problem Relation Age of Onset  . Hypertension Mother   . Diabetes Mother   . Diabetes Father   . Hypertension Father   . Cancer Paternal Uncle  colon cancer  . Diabetes Sister    BP 110/62  Pulse 90  Temp(Src) 98.2 F (36.8 C) (Oral)  Wt 191 lb (86.637 kg)  SpO2 95%  Review of Systems Denies tremor    Objective:   Physical Exam VITAL SIGNS:  See vs page GENERAL: no distress NECK: There is no palpable thyroid enlargement.  No thyroid nodule is palpable.  No palpable lymphadenopathy at the anterior neck.   Skin: not diaphoretic. Neuro: no tremor   Lab Results  Component Value Date   TSH 1.08 05/21/2013      Assessment & Plan:  Hyperthyroidism, resolved with i-131 rx Multinodular goiter, clinically better

## 2013-05-21 NOTE — Patient Instructions (Signed)
Let's recheck the thyroid blood tests.  We'll contact you with results. Please come back for a follow-up appointment in 4-6 weeks.  

## 2013-07-02 ENCOUNTER — Encounter: Payer: Self-pay | Admitting: Endocrinology

## 2013-07-02 ENCOUNTER — Ambulatory Visit (INDEPENDENT_AMBULATORY_CARE_PROVIDER_SITE_OTHER): Payer: BC Managed Care – PPO | Admitting: Endocrinology

## 2013-07-02 VITALS — BP 130/86 | HR 95 | Temp 98.3°F | Ht 63.0 in | Wt 191.0 lb

## 2013-07-02 DIAGNOSIS — E0590 Thyrotoxicosis, unspecified without thyrotoxic crisis or storm: Secondary | ICD-10-CM

## 2013-07-02 LAB — HEMOGLOBIN A1C: HEMOGLOBIN A1C: 6.8 % — AB (ref 4.6–6.5)

## 2013-07-02 LAB — T4, FREE: Free T4: 0.62 ng/dL (ref 0.60–1.60)

## 2013-07-02 MED ORDER — NEOMYCIN-POLYMYXIN-HC 3.5-10000-1 OT SOLN: 4.0000 [drp] | OTIC | Status: DC

## 2013-07-02 NOTE — Patient Instructions (Addendum)
Let's recheck the thyroid blood tests.  We'll contact you with results. Please come back for a follow-up appointment in 4-6 weeks. i have sent a prescription to your pharmacy, for an antibiotic liquid for your ear.

## 2013-07-02 NOTE — Progress Notes (Signed)
Subjective:    Patient ID: Erica Mills, female    DOB: [birth year], 58 y.o.   MRN: [id number]  HPI Pt returns for f/u of hyperthyroidism due to multinodular goiter (dx'ed 2010; scan in 2010 showed low uptake; abnormal tsh resolved in 2011, but it recurred in 2013; she had nuc med scan (showed normal uptake, and multinodular pattern), in preparation for i-131; however, she not get i-131, as she did not have insurance; she eventually had i-131 rx in august  of 2014).  pt states she feels well in general, except for "hot flashes." she has weight gain.   She has a few days of slight pain at the right ear, and assoc drainage Past Medical History  Diagnosis Date  . ACHILLES TENDINITIS 10/06/2009  . ALLERGIC RHINITIS 05/19/2007  . CARPAL TUNNEL SYNDROME, BILATERAL 05/19/2007  . ELBOW PAIN, RIGHT 04/21/2008  . GLUCOSE INTOLERANCE 02/14/2009  . HYPERLIPIDEMIA 05/19/2007  . HYPERTENSION 05/19/2007  . HYPERTHYROIDISM 03/10/2009  . Lateral epicondylitis  of elbow 04/21/2008  . SINUSITIS- ACUTE-NOS 05/11/2010  . SMOKER 05/19/2007  . Fibroids   . H/O menorrhagia   . Cyst of breast, right, solitary 07/2012    Past Surgical History  Procedure Laterality Date  . Tonsillectomy    . Cesarean section      History   Social History  . Marital Status: Married    Spouse Name: N/A    Number of Children: N/A  . Years of Education: N/A   Occupational History  . Not on file.   Social History Main Topics  . Smoking status: Former Research scientist (life sciences)  . Smokeless tobacco: Never Used     Comment: QUIT SMOKING X 4 WEEKS  . Alcohol Use: No  . Drug Use: No  . Sexual Activity: Yes    Birth Control/ Protection: Surgical, Post-menopausal     Comment: BTL   Other Topics Concern  . Not on file   Social History Narrative  . No narrative on file    Current Outpatient Prescriptions on File Prior to Visit  Medication Sig Dispense Refill  . amLODipine (NORVASC) 10 MG tablet Take 1 tablet (10 mg total) by mouth daily.   90 tablet  3  . aspirin 81 MG tablet Take 81 mg by mouth daily.        Marland Kitchen atorvastatin (LIPITOR) 20 MG tablet Take 1 tablet (20 mg total) by mouth daily.  90 tablet  3  . fluticasone (FLONASE) 50 MCG/ACT nasal spray Place 2 sprays into the nose daily.  16 g  5  . irbesartan (AVAPRO) 150 MG tablet Take 1 tablet (150 mg total) by mouth daily.  90 tablet  3  . naproxen (NAPROSYN) 500 MG tablet TAKE 1 TABLET (500 MG TOTAL) BY MOUTH 2 (TWO) TIMES DAILY.  180 tablet  3  . varenicline (CHANTIX CONTINUING MONTH PAK) 1 MG tablet Take 1 tablet (1 mg total) by mouth 2 (two) times daily.  60 tablet  2  . fexofenadine (ALLEGRA) 180 MG tablet Take 1 tablet (180 mg total) by mouth daily.  30 tablet  2  . sertraline (ZOLOFT) 50 MG tablet Take 1 tablet (50 mg total) by mouth daily.  90 tablet  3   No current facility-administered medications on file prior to visit.    Allergies  Allergen Reactions  . Sulfonamide Derivatives     REACTION: rash    Family History  Problem Relation Age of Onset  . Hypertension Mother   . Diabetes Mother   .  Diabetes Father   . Hypertension Father   . Cancer Paternal Uncle     colon cancer  . Diabetes Sister     BP 130/86  Pulse 95  Temp(Src) 98.3 F (36.8 C) (Oral)  Ht 5\' 3"  (1.6 m)  Wt 191 lb (86.637 kg)  BMI 33.84 kg/m2  SpO2 96%  Review of Systems Denies fever or decreased hearing    Objective:   Physical Exam VITAL SIGNS:  See vs page GENERAL: no distress Right eac is red. Neck: ? Of right thyroid nodule, ? Size.  Free T4=normal    Assessment & Plan:  Hyperthyroidism, resolved with i-131 rx Right aom, new

## 2013-07-24 ENCOUNTER — Other Ambulatory Visit: Payer: Self-pay | Admitting: Internal Medicine

## 2013-08-13 ENCOUNTER — Ambulatory Visit (INDEPENDENT_AMBULATORY_CARE_PROVIDER_SITE_OTHER): Payer: BC Managed Care – PPO | Admitting: Endocrinology

## 2013-08-13 ENCOUNTER — Encounter: Payer: Self-pay | Admitting: Endocrinology

## 2013-08-13 VITALS — BP 122/84 | HR 90 | Temp 98.5°F | Ht 63.0 in | Wt 192.0 lb

## 2013-08-13 DIAGNOSIS — E1165 Type 2 diabetes mellitus with hyperglycemia: Principal | ICD-10-CM

## 2013-08-13 DIAGNOSIS — IMO0001 Reserved for inherently not codable concepts without codable children: Secondary | ICD-10-CM

## 2013-08-13 LAB — TSH: TSH: 0.48 u[IU]/mL (ref 0.35–5.50)

## 2013-08-13 MED ORDER — TRIAMCINOLONE ACETONIDE 0.1 % EX CREA: 1.0000 "application " | TOPICAL_CREAM | Freq: Four times a day (QID) | CUTANEOUS | Status: DC

## 2013-08-13 NOTE — Progress Notes (Signed)
Subjective:    Patient ID: Erica Mills, female    DOB: Sep 10, 1955, 58 y.o.   MRN: [id number]  HPI Pt returns for f/u of hyperthyroidism due to multinodular goiter (dx'ed 2010; scan in 2010 showed low uptake; abnormal tsh resolved in 2011, but it recurred in 2013; she had nuc med scan (showed normal uptake, and multinodular pattern), in preparation for i-131; US showed 5 cm right lobe mass; however, she not get i-131, as she did not have insurance; she eventually had i-131 rx in august of 2014).   Pt states 2 weeks of slight rash at the right forearm, and assoc itching Past Medical History  Diagnosis Date  . ACHILLES TENDINITIS 10/06/2009  . ALLERGIC RHINITIS 05/19/2007  . CARPAL TUNNEL SYNDROME, BILATERAL 05/19/2007  . ELBOW PAIN, RIGHT 04/21/2008  . GLUCOSE INTOLERANCE 02/14/2009  . HYPERLIPIDEMIA 05/19/2007  . HYPERTENSION 05/19/2007  . HYPERTHYROIDISM 03/10/2009  . Lateral epicondylitis  of elbow 04/21/2008  . SINUSITIS- ACUTE-NOS 05/11/2010  . SMOKER 05/19/2007  . Fibroids   . H/O menorrhagia   . Cyst of breast, right, solitary 07/2012    Past Surgical History  Procedure Laterality Date  . Tonsillectomy    . Cesarean section      History   Social History  . Marital Status: Married    Spouse Name: N/A    Number of Children: N/A  . Years of Education: N/A   Occupational History  . Not on file.   Social History Main Topics  . Smoking status: Former Research scientist (life sciences)  . Smokeless tobacco: Never Used     Comment: QUIT SMOKING X 4 WEEKS  . Alcohol Use: No  . Drug Use: No  . Sexual Activity: Yes    Birth Control/ Protection: Surgical, Post-menopausal     Comment: BTL   Other Topics Concern  . Not on file   Social History Narrative  . No narrative on file    Current Outpatient Prescriptions on File Prior to Visit  Medication Sig Dispense Refill  . amLODipine (NORVASC) 10 MG tablet Take 1 tablet (10 mg total) by mouth daily.  90 tablet  3  . aspirin 81 MG tablet Take 81 mg by  mouth daily.        Marland Kitchen atorvastatin (LIPITOR) 20 MG tablet Take 1 tablet (20 mg total) by mouth daily.  90 tablet  3  . fluticasone (FLONASE) 50 MCG/ACT nasal spray Place 2 sprays into the nose daily.  16 g  5  . irbesartan (AVAPRO) 150 MG tablet Take 1 tablet (150 mg total) by mouth daily.  90 tablet  3  . naproxen (NAPROSYN) 500 MG tablet TAKE 1 TABLET (500 MG TOTAL) BY MOUTH 2 (TWO) TIMES DAILY.  180 tablet  3  . neomycin-polymyxin-hydrocortisone (CORTISPORIN) otic solution Place 4 drops into the right ear every 4 (four) hours.  10 mL  0  . sertraline (ZOLOFT) 50 MG tablet TAKE 1 TABLET (50 MG TOTAL) BY MOUTH DAILY.  90 tablet  0  . varenicline (CHANTIX CONTINUING MONTH PAK) 1 MG tablet Take 1 tablet (1 mg total) by mouth 2 (two) times daily.  60 tablet  2  . fexofenadine (ALLEGRA) 180 MG tablet Take 1 tablet (180 mg total) by mouth daily.  30 tablet  2  . sertraline (ZOLOFT) 50 MG tablet Take 1 tablet (50 mg total) by mouth daily.  90 tablet  3   No current facility-administered medications on file prior to visit.    Allergies  Allergen Reactions  . Sulfonamide Derivatives     REACTION: rash    Family History  Problem Relation Age of Onset  . Hypertension Mother   . Diabetes Mother   . Diabetes Father   . Hypertension Father   . Cancer Paternal Uncle     colon cancer  . Diabetes Sister     BP 122/84  Pulse 90  Temp(Src) 98.5 F (36.9 C) (Oral)  Ht 5\' 3"  (1.6 m)  Wt 192 lb (87.091 kg)  BMI 34.02 kg/m2  SpO2 95%   Review of Systems She has weight gain, but no cold intolerance.    Objective:   Physical Exam VITAL SIGNS:  See vs page GENERAL: no distress Neck: seems enlarged on the right, but I can't tell details.   Right forearm: 5 cm hyperpigmented dry patch.  Lab Results  Component Value Date   TSH 0.48 08/13/2013      Assessment & Plan:  Multinodular goiter: clinically persistent. Hyperthyroidism: resolved with I-131 rx, but she is at risk for  recurrence. Rash, new, uncertain etiology

## 2013-08-13 NOTE — Patient Instructions (Addendum)
i have sent a prescription to your pharmacy, for the rash. blood tests are being requested for you today.  We'll contact you with results. Please come back for a follow-up appointment in 3 months.   most of the time, a "lumpy thyroid" will eventually become overactive again.  this is usually a slow process, happening over the span of many years.

## 2013-08-14 ENCOUNTER — Other Ambulatory Visit: Payer: Self-pay | Admitting: Internal Medicine

## 2013-09-03 ENCOUNTER — Encounter: Payer: Self-pay | Admitting: Internal Medicine

## 2013-09-03 ENCOUNTER — Ambulatory Visit (INDEPENDENT_AMBULATORY_CARE_PROVIDER_SITE_OTHER): Payer: BC Managed Care – PPO | Admitting: Internal Medicine

## 2013-09-03 DIAGNOSIS — Z0000 Encounter for general adult medical examination without abnormal findings: Secondary | ICD-10-CM

## 2013-09-03 DIAGNOSIS — IMO0001 Reserved for inherently not codable concepts without codable children: Secondary | ICD-10-CM

## 2013-09-03 DIAGNOSIS — R21 Rash and other nonspecific skin eruption: Secondary | ICD-10-CM

## 2013-09-03 DIAGNOSIS — G5600 Carpal tunnel syndrome, unspecified upper limb: Secondary | ICD-10-CM

## 2013-09-03 DIAGNOSIS — E1165 Type 2 diabetes mellitus with hyperglycemia: Secondary | ICD-10-CM

## 2013-09-03 MED ORDER — TRIAMCINOLONE ACETONIDE 0.1 % EX CREA: 1.0000 "application " | TOPICAL_CREAM | Freq: Four times a day (QID) | CUTANEOUS | Status: DC

## 2013-09-03 MED ORDER — SERTRALINE HCL 50 MG PO TABS: 50.0000 mg | ORAL_TABLET | Freq: Every day | ORAL | Status: DC

## 2013-09-03 MED ORDER — ATORVASTATIN CALCIUM 20 MG PO TABS: 20.0000 mg | ORAL_TABLET | Freq: Every day | ORAL | Status: DC

## 2013-09-03 MED ORDER — IRBESARTAN 150 MG PO TABS: 150.0000 mg | ORAL_TABLET | Freq: Every day | ORAL | Status: DC

## 2013-09-03 MED ORDER — AMLODIPINE BESYLATE 10 MG PO TABS: 10.0000 mg | ORAL_TABLET | Freq: Every day | ORAL | Status: DC

## 2013-09-03 NOTE — Assessment & Plan Note (Signed)

## 2013-09-03 NOTE — Progress Notes (Signed)
Pre visit review using our clinic review tool, if applicable. No additional management support is needed unless otherwise documented below in the visit note. 

## 2013-09-03 NOTE — Assessment & Plan Note (Signed)
For repeat bilat wrist splints

## 2013-09-03 NOTE — Assessment & Plan Note (Signed)
stable overall by history and exam, recent data reviewed with pt, and pt to continue medical treatment as before,  to f/u any worsening symptoms or concerns Lab Results  Component Value Date   HGBA1C 6.8* 07/02/2013

## 2013-09-03 NOTE — Assessment & Plan Note (Signed)
C/w dermatitis, for triam cr prn 

## 2013-09-03 NOTE — Patient Instructions (Addendum)
Please take all new medication as prescribed - the cream for the wrist rash  You are given the wrist splints to use at night  Please continue all other medications as before, and refills have been done if requested. Please have the pharmacy call with any other refills you may need.  Please continue your efforts at being more active, low cholesterol diet, and weight control.  You are otherwise up to date with prevention measures today.  Please keep your appointments with your specialists as you may have planned  Please go to the LAB in the Basement (turn left off the elevator) for the tests to be done today You will be contacted by phone if any changes need to be made immediately.  Otherwise, you will receive a letter about your results with an explanation, but please check with MyChart first.  Please return in 6 months, or sooner if needed

## 2013-09-03 NOTE — Progress Notes (Signed)
Subjective:    Patient ID: Erica Mills, female    DOB: 1956/05/10, 58 y.o.   MRN: [id number]  HPI  Here for wellness and f/u;  Overall doing ok;  Pt denies CP, worsening SOB, DOE, wheezing, orthopnea, PND, worsening LE edema, palpitations, dizziness or syncope.  Pt denies neurological change such as new headache, facial or extremity weakness.  Pt denies polydipsia, polyuria, or low sugar symptoms. Pt states overall good compliance with treatment and medications, good tolerability, and has been trying to follow lower cholesterol diet.  Pt denies worsening depressive symptoms, suicidal ideation or panic. No fever, night sweats, wt loss, loss of appetite, or other constitutional symptoms.  Pt states good ability with ADL's, has low fall risk, home safety reviewed and adequate, no other significant changes in hearing or vision, and only occasionally active with exercise. Does have a itchy red area to the right wrist for 2 wks, also bilat numb/tingling CTS like symptoms to hands left > right, wrist splints help but current splints broken. Past Medical History  Diagnosis Date  . ACHILLES TENDINITIS 10/06/2009  . ALLERGIC RHINITIS 05/19/2007  . CARPAL TUNNEL SYNDROME, BILATERAL 05/19/2007  . ELBOW PAIN, RIGHT 04/21/2008  . GLUCOSE INTOLERANCE 02/14/2009  . HYPERLIPIDEMIA 05/19/2007  . HYPERTENSION 05/19/2007  . HYPERTHYROIDISM 03/10/2009  . Lateral epicondylitis  of elbow 04/21/2008  . SINUSITIS- ACUTE-NOS 05/11/2010  . SMOKER 05/19/2007  . Fibroids   . H/O menorrhagia   . Cyst of breast, right, solitary 07/2012   Past Surgical History  Procedure Laterality Date  . Tonsillectomy    . Cesarean section      reports that she has quit smoking. She has never used smokeless tobacco. She reports that she does not drink alcohol or use illicit drugs. family history includes Cancer in her paternal uncle; Diabetes in her father, mother, and sister; Hypertension in her father and mother. Allergies  Allergen  Reactions  . Sulfonamide Derivatives     REACTION: rash   Current Outpatient Prescriptions on File Prior to Visit  Medication Sig Dispense Refill  . aspirin 81 MG tablet Take 81 mg by mouth daily.        . fluticasone (FLONASE) 50 MCG/ACT nasal spray Place 2 sprays into the nose daily.  16 g  5  . naproxen (NAPROSYN) 500 MG tablet TAKE 1 TABLET (500 MG TOTAL) BY MOUTH 2 (TWO) TIMES DAILY.  180 tablet  3  . neomycin-polymyxin-hydrocortisone (CORTISPORIN) otic solution Place 4 drops into the right ear every 4 (four) hours.  10 mL  0  . fexofenadine (ALLEGRA) 180 MG tablet Take 1 tablet (180 mg total) by mouth daily.  30 tablet  2  . sertraline (ZOLOFT) 50 MG tablet Take 1 tablet (50 mg total) by mouth daily.  90 tablet  3   No current facility-administered medications on file prior to visit.   Review of Systems Constitutional: Negative for diaphoresis, activity change, appetite change or unexpected weight change.  HENT: Negative for hearing loss, ear pain, facial swelling, mouth sores and neck stiffness.   Eyes: Negative for pain, redness and visual disturbance.  Respiratory: Negative for shortness of breath and wheezing.   Cardiovascular: Negative for chest pain and palpitations.  Gastrointestinal: Negative for diarrhea, blood in stool, abdominal distention or other pain Genitourinary: Negative for hematuria, flank pain or change in urine volume.  Musculoskeletal: Negative for myalgias and joint swelling.  Skin: Negative for color change and wound.  Neurological: Negative for syncope and numbness. other  than noted Hematological: Negative for adenopathy.  Psychiatric/Behavioral: Negative for hallucinations, self-injury, decreased concentration and agitation.      Objective:   Physical Exam There were no vitals taken for this visit. VS noted,  Constitutional: Pt is oriented to person, place, and time. Appears well-developed and well-nourished.  Head: Normocephalic and atraumatic.    Right Ear: External ear normal.  Left Ear: External ear normal.  Nose: Nose normal.  Mouth/Throat: Oropharynx is clear and moist.  Eyes: Conjunctivae and EOM are normal. Pupils are equal, round, and reactive to light.  Neck: Normal range of motion. Neck supple. No JVD present. No tracheal deviation present.  Cardiovascular: Normal rate, regular rhythm, normal heart sounds and intact distal pulses.   Pulmonary/Chest: Effort normal and breath sounds normal.  Abdominal: Soft. Bowel sounds are normal. There is no tenderness. No HSM  Musculoskeletal: Normal range of motion. Exhibits no edema.  Lymphadenopathy:  Has no cervical adenopathy.  Neurological: Pt is alert and oriented to person, place, and time. Pt has normal reflexes. No cranial nerve deficit. , motor 5/5, gait intact Skin: Skin is warm and dry.NT erythema area slightly raised 1.5 cm oval ant right wrist Psychiatric:  Has  normal mood and affect. Behavior is normal.     Assessment & Plan:

## 2013-09-07 ENCOUNTER — Other Ambulatory Visit: Payer: BC Managed Care – PPO

## 2013-09-07 ENCOUNTER — Telehealth: Payer: Self-pay | Admitting: Internal Medicine

## 2013-09-07 ENCOUNTER — Ambulatory Visit: Payer: BC Managed Care – PPO

## 2013-09-07 ENCOUNTER — Other Ambulatory Visit (INDEPENDENT_AMBULATORY_CARE_PROVIDER_SITE_OTHER): Payer: BC Managed Care – PPO

## 2013-09-07 DIAGNOSIS — Z0000 Encounter for general adult medical examination without abnormal findings: Secondary | ICD-10-CM

## 2013-09-07 DIAGNOSIS — R7309 Other abnormal glucose: Secondary | ICD-10-CM

## 2013-09-07 LAB — URINALYSIS, ROUTINE W REFLEX MICROSCOPIC
BILIRUBIN URINE: NEGATIVE
Hgb urine dipstick: NEGATIVE
Ketones, ur: NEGATIVE
NITRITE: NEGATIVE
RBC / HPF: NONE SEEN (ref 0–?)
Specific Gravity, Urine: 1.025 (ref 1.000–1.030)
TOTAL PROTEIN, URINE-UPE24: NEGATIVE
Urine Glucose: NEGATIVE
Urobilinogen, UA: 0.2 (ref 0.0–1.0)
pH: 6 (ref 5.0–8.0)

## 2013-09-07 LAB — HEPATIC FUNCTION PANEL
ALK PHOS: 72 U/L (ref 39–117)
ALT: 25 U/L (ref 0–35)
AST: 19 U/L (ref 0–37)
Albumin: 4 g/dL (ref 3.5–5.2)
BILIRUBIN TOTAL: 0.4 mg/dL (ref 0.3–1.2)
Bilirubin, Direct: 0 mg/dL (ref 0.0–0.3)
Total Protein: 7.2 g/dL (ref 6.0–8.3)

## 2013-09-07 LAB — CBC WITH DIFFERENTIAL/PLATELET
BASOS ABS: 0 10*3/uL (ref 0.0–0.1)
Basophils Relative: 0.4 % (ref 0.0–3.0)
Eosinophils Absolute: 0.3 10*3/uL (ref 0.0–0.7)
Eosinophils Relative: 4.1 % (ref 0.0–5.0)
HEMATOCRIT: 38.1 % (ref 36.0–46.0)
Hemoglobin: 12.5 g/dL (ref 12.0–15.0)
LYMPHS ABS: 2.8 10*3/uL (ref 0.7–4.0)
Lymphocytes Relative: 32.9 % (ref 12.0–46.0)
MCHC: 32.9 g/dL (ref 30.0–36.0)
MCV: 88.8 fl (ref 78.0–100.0)
Monocytes Absolute: 0.7 10*3/uL (ref 0.1–1.0)
Monocytes Relative: 8 % (ref 3.0–12.0)
Neutro Abs: 4.6 10*3/uL (ref 1.4–7.7)
Neutrophils Relative %: 54.6 % (ref 43.0–77.0)
PLATELETS: 251 10*3/uL (ref 150.0–400.0)
RBC: 4.29 Mil/uL (ref 3.87–5.11)
RDW: 16.5 % — AB (ref 11.5–14.6)
WBC: 8.4 10*3/uL (ref 4.5–10.5)

## 2013-09-07 LAB — LIPID PANEL
Cholesterol: 182 mg/dL (ref 0–200)
HDL: 61.2 mg/dL (ref >39.00)
LDL CALC: 107 mg/dL — AB (ref 0–99)
Total CHOL/HDL Ratio: 3
Triglycerides: 67 mg/dL (ref 0.0–149.0)
VLDL: 13.4 mg/dL (ref 0.0–40.0)

## 2013-09-07 LAB — BASIC METABOLIC PANEL
BUN: 13 mg/dL (ref 6–23)
CO2: 27 mEq/L (ref 19–32)
Calcium: 9.3 mg/dL (ref 8.4–10.5)
Chloride: 107 mEq/L (ref 96–112)
Creatinine, Ser: 0.8 mg/dL (ref 0.4–1.2)
GFR: 98.96 mL/min (ref >60.00)
Glucose, Bld: 116 mg/dL — ABNORMAL HIGH (ref 70–99)
POTASSIUM: 4.1 meq/L (ref 3.5–5.1)
Sodium: 142 mEq/L (ref 135–145)

## 2013-09-07 LAB — HEMOGLOBIN A1C: HEMOGLOBIN A1C: 6.8 % — AB (ref 4.6–6.5)

## 2013-09-07 MED ORDER — CEPHALEXIN 500 MG PO CAPS: 500.0000 mg | ORAL_CAPSULE | Freq: Four times a day (QID) | ORAL | Status: DC

## 2013-09-07 NOTE — Telephone Encounter (Signed)
Patient aware.

## 2013-09-07 NOTE — Telephone Encounter (Signed)
Antibiotic done erx 

## 2013-09-07 NOTE — Telephone Encounter (Signed)
Message copied by Biagio Borg on Tue Sep 07, 2013  6:44 PM ------      Message from: Jamesetta Orleans      Created: Tue Sep 07, 2013  6:32 PM       Patient informed of results and would like antibiotic sent to [facility]. ------

## 2013-09-15 ENCOUNTER — Telehealth: Payer: Self-pay

## 2013-09-15 NOTE — Telephone Encounter (Signed)
Relevant patient education assigned to patient using Emmi. ° °

## 2013-09-27 ENCOUNTER — Other Ambulatory Visit: Payer: Self-pay | Admitting: Obstetrics and Gynecology

## 2013-09-27 DIAGNOSIS — Z1231 Encounter for screening mammogram for malignant neoplasm of breast: Secondary | ICD-10-CM

## 2013-10-08 ENCOUNTER — Ambulatory Visit
Admission: RE | Admit: 2013-10-08 | Discharge: 2013-10-08 | Disposition: A | Discharge status: Home / Self Care | Payer: BC Managed Care – PPO | Source: Ambulatory Visit | Attending: Obstetrics and Gynecology | Admitting: Obstetrics and Gynecology

## 2013-10-08 DIAGNOSIS — Z1231 Encounter for screening mammogram for malignant neoplasm of breast: Secondary | ICD-10-CM

## 2013-10-08 LAB — HM MAMMOGRAPHY

## 2013-11-12 ENCOUNTER — Encounter: Payer: Self-pay | Admitting: Endocrinology

## 2013-11-12 ENCOUNTER — Ambulatory Visit (INDEPENDENT_AMBULATORY_CARE_PROVIDER_SITE_OTHER): Payer: BC Managed Care – PPO | Admitting: Endocrinology

## 2013-11-12 VITALS — BP 130/88 | HR 85 | Temp 98.0°F | Ht 65.0 in | Wt 192.0 lb

## 2013-11-12 DIAGNOSIS — E042 Nontoxic multinodular goiter: Secondary | ICD-10-CM

## 2013-11-12 LAB — TSH: TSH: 0.23 u[IU]/mL — AB (ref 0.35–4.50)

## 2013-11-12 NOTE — Patient Instructions (Addendum)
A thyroid blood test is requested for you today.  We'll contact you with results. Let's also recheck the ultrasound.  you will receive a phone call, about a day and time for an appointment. Please come back for a follow-up appointment in 6 months.   most of the time, a "lumpy thyroid" will eventually become overactive again.  this is usually a slow process, happening over the span of many years.

## 2013-11-12 NOTE — Progress Notes (Signed)
Subjective:    Patient ID: Erica Mills, female    DOB: 04/13/1956, 58 y.o.   MRN: [id number]  HPI Pt returns for f/u of hyperthyroidism due to multinodular goiter (dx'ed 2010; scan in 2010 showed low uptake; abnormal tsh resolved in 2011, but it recurred in 2013; she had nuc med scan (showed normal uptake, and multinodular pattern), in preparation for i-131; US showed 5 cm right lobe mass; however, she not get i-131, as she did not have insurance; she eventually had i-131 rx in august of 2014; TSH normalized, and she has not needed synthroid).  She has mild if any swelling of the neck, or assoc pain.   Past Medical History  Diagnosis Date  . ACHILLES TENDINITIS 10/06/2009  . ALLERGIC RHINITIS 05/19/2007  . CARPAL TUNNEL SYNDROME, BILATERAL 05/19/2007  . ELBOW PAIN, RIGHT 04/21/2008  . GLUCOSE INTOLERANCE 02/14/2009  . HYPERLIPIDEMIA 05/19/2007  . HYPERTENSION 05/19/2007  . HYPERTHYROIDISM 03/10/2009  . Lateral epicondylitis  of elbow 04/21/2008  . SINUSITIS- ACUTE-NOS 05/11/2010  . SMOKER 05/19/2007  . Fibroids   . H/O menorrhagia   . Cyst of breast, right, solitary 07/2012    Past Surgical History  Procedure Laterality Date  . Tonsillectomy    . Cesarean section      History   Social History  . Marital Status: Married    Spouse Name: N/A    Number of Children: N/A  . Years of Education: N/A   Occupational History  . Not on file.   Social History Main Topics  . Smoking status: Former Research scientist (life sciences)  . Smokeless tobacco: Never Used     Comment: QUIT SMOKING X 4 WEEKS  . Alcohol Use: No  . Drug Use: No  . Sexual Activity: Yes    Birth Control/ Protection: Surgical, Post-menopausal     Comment: BTL   Other Topics Concern  . Not on file   Social History Narrative  . No narrative on file    Current Outpatient Prescriptions on File Prior to Visit  Medication Sig Dispense Refill  . amLODipine (NORVASC) 10 MG tablet Take 1 tablet (10 mg total) by mouth daily.  90 tablet  3  .  aspirin 81 MG tablet Take 81 mg by mouth daily.        Marland Kitchen atorvastatin (LIPITOR) 20 MG tablet Take 1 tablet (20 mg total) by mouth daily.  90 tablet  3  . cephALEXin (KEFLEX) 500 MG capsule Take 1 capsule (500 mg total) by mouth 4 (four) times daily.  40 capsule  0  . irbesartan (AVAPRO) 150 MG tablet Take 1 tablet (150 mg total) by mouth daily.  90 tablet  3  . naproxen (NAPROSYN) 500 MG tablet TAKE 1 TABLET (500 MG TOTAL) BY MOUTH 2 (TWO) TIMES DAILY.  180 tablet  3  . neomycin-polymyxin-hydrocortisone (CORTISPORIN) otic solution Place 4 drops into the right ear every 4 (four) hours.  10 mL  0  . sertraline (ZOLOFT) 50 MG tablet Take 1 tablet (50 mg total) by mouth daily.  90 tablet  3  . triamcinolone cream (KENALOG) 0.1 % Apply 1 application topically 4 (four) times daily. As needed for rash  30 g  0  . fexofenadine (ALLEGRA) 180 MG tablet Take 1 tablet (180 mg total) by mouth daily.  30 tablet  2  . fluticasone (FLONASE) 50 MCG/ACT nasal spray Place 2 sprays into the nose daily.  16 g  5  . sertraline (ZOLOFT) 50 MG tablet Take 1  tablet (50 mg total) by mouth daily.  90 tablet  3   No current facility-administered medications on file prior to visit.    Allergies  Allergen Reactions  . Sulfonamide Derivatives     REACTION: rash    Family History  Problem Relation Age of Onset  . Hypertension Mother   . Diabetes Mother   . Diabetes Father   . Hypertension Father   . Cancer Paternal Uncle     colon cancer  . Diabetes Sister     BP 130/88  Pulse 85  Temp(Src) 98 F (36.7 C) (Oral)  Ht 5\' 5"  (1.651 m)  Wt 192 lb (87.091 kg)  BMI 31.95 kg/m2  SpO2 95%  Review of Systems Denies weight change and tremor.      Objective:   Physical Exam VITAL SIGNS:  See vs page GENERAL: no distress Neck: i think i can feel the right lower pole mass, but I can't tell for sure  Lab Results  Component Value Date   TSH 0.23* 11/12/2013      Assessment & Plan:  Multinodular goiter:  clinically unchanged Hyperthyroidism: mild exacerbation   Patient is advised the following: Patient Instructions  A thyroid blood test is requested for you today.  We'll contact you with results. Let's also recheck the ultrasound.  you will receive a phone call, about a day and time for an appointment. Please come back for a follow-up appointment in 6 months.   most of the time, a "lumpy thyroid" will eventually become overactive again.  this is usually a slow process, happening over the span of many years.    addendum: Your thyroid is a little overactive again. You probably need another radioactive iodine pill. In addition to the ultrasound, please recheck the nuclear medicine scan.

## 2013-11-23 ENCOUNTER — Ambulatory Visit
Admission: RE | Admit: 2013-11-23 | Discharge: 2013-11-23 | Disposition: A | Discharge status: Home / Self Care | Payer: BC Managed Care – PPO | Source: Ambulatory Visit | Attending: Endocrinology | Admitting: Endocrinology

## 2013-11-23 DIAGNOSIS — E042 Nontoxic multinodular goiter: Secondary | ICD-10-CM

## 2013-11-24 ENCOUNTER — Other Ambulatory Visit: Payer: Self-pay | Admitting: Endocrinology

## 2013-11-24 DIAGNOSIS — E042 Nontoxic multinodular goiter: Secondary | ICD-10-CM

## 2014-01-06 ENCOUNTER — Encounter (HOSPITAL_COMMUNITY)
Admission: RE | Admit: 2014-01-06 | Discharge: 2014-01-06 | Disposition: A | Discharge status: Home / Self Care | Payer: BC Managed Care – PPO | Source: Ambulatory Visit | Attending: Diagnostic Radiology | Admitting: Diagnostic Radiology

## 2014-01-06 DIAGNOSIS — E042 Nontoxic multinodular goiter: Secondary | ICD-10-CM

## 2014-01-06 DIAGNOSIS — E0520 Thyrotoxicosis with toxic multinodular goiter without thyrotoxic crisis or storm: Secondary | ICD-10-CM | POA: Insufficient documentation

## 2014-01-06 MED ORDER — SODIUM IODIDE I 131 CAPSULE
11.1000 | Freq: Once | INTRAVENOUS | Status: AC | PRN
Start: 2014-01-06 — End: 2014-01-06
  Administered 2014-01-06: 11.1 via ORAL

## 2014-01-07 ENCOUNTER — Encounter (HOSPITAL_COMMUNITY)
Admission: RE | Admit: 2014-01-07 | Discharge: 2014-01-07 | Disposition: A | Discharge status: Home / Self Care | Payer: BC Managed Care – PPO | Source: Ambulatory Visit | Attending: Endocrinology | Admitting: Endocrinology

## 2014-01-07 DIAGNOSIS — E0520 Thyrotoxicosis with toxic multinodular goiter without thyrotoxic crisis or storm: Secondary | ICD-10-CM | POA: Diagnosis present

## 2014-01-07 MED ORDER — SODIUM PERTECHNETATE TC 99M INJECTION
10.0000 | Freq: Once | INTRAVENOUS | Status: AC | PRN
Start: 2014-01-07 — End: 2014-01-07
  Administered 2014-01-07: 10 via INTRAVENOUS

## 2014-01-19 ENCOUNTER — Other Ambulatory Visit: Payer: Self-pay | Admitting: Endocrinology

## 2014-01-19 DIAGNOSIS — E0590 Thyrotoxicosis, unspecified without thyrotoxic crisis or storm: Secondary | ICD-10-CM

## 2014-02-18 ENCOUNTER — Encounter (HOSPITAL_COMMUNITY): Payer: BC Managed Care – PPO

## 2014-02-25 ENCOUNTER — Encounter (HOSPITAL_COMMUNITY)
Admission: RE | Admit: 2014-02-25 | Discharge: 2014-02-25 | Disposition: A | Discharge status: Home / Self Care | Payer: BC Managed Care – PPO | Source: Ambulatory Visit | Attending: Endocrinology | Admitting: Endocrinology

## 2014-02-25 DIAGNOSIS — E0520 Thyrotoxicosis with toxic multinodular goiter without thyrotoxic crisis or storm: Secondary | ICD-10-CM | POA: Diagnosis present

## 2014-02-25 DIAGNOSIS — E0590 Thyrotoxicosis, unspecified without thyrotoxic crisis or storm: Secondary | ICD-10-CM

## 2014-02-25 LAB — HCG, SERUM, QUALITATIVE: Preg, Serum: NEGATIVE

## 2014-02-25 MED ORDER — SODIUM IODIDE I 131 CAPSULE
35.0000 | Freq: Once | INTRAVENOUS | Status: AC | PRN
  Administered 2014-02-25: 36.6 via ORAL

## 2014-03-11 ENCOUNTER — Encounter: Payer: Self-pay | Admitting: Internal Medicine

## 2014-03-11 ENCOUNTER — Ambulatory Visit (INDEPENDENT_AMBULATORY_CARE_PROVIDER_SITE_OTHER): Payer: BC Managed Care – PPO | Admitting: Internal Medicine

## 2014-03-11 ENCOUNTER — Other Ambulatory Visit (INDEPENDENT_AMBULATORY_CARE_PROVIDER_SITE_OTHER): Payer: BC Managed Care – PPO

## 2014-03-11 VITALS — BP 120/76 | HR 91 | Temp 98.0°F | Wt 200.1 lb

## 2014-03-11 DIAGNOSIS — Z0189 Encounter for other specified special examinations: Secondary | ICD-10-CM

## 2014-03-11 DIAGNOSIS — E119 Type 2 diabetes mellitus without complications: Secondary | ICD-10-CM

## 2014-03-11 DIAGNOSIS — Z0000 Encounter for general adult medical examination without abnormal findings: Secondary | ICD-10-CM

## 2014-03-11 DIAGNOSIS — E785 Hyperlipidemia, unspecified: Secondary | ICD-10-CM

## 2014-03-11 DIAGNOSIS — G5602 Carpal tunnel syndrome, left upper limb: Secondary | ICD-10-CM

## 2014-03-11 DIAGNOSIS — I10 Essential (primary) hypertension: Secondary | ICD-10-CM

## 2014-03-11 LAB — BASIC METABOLIC PANEL
BUN: 17 mg/dL (ref 6–23)
CHLORIDE: 107 meq/L (ref 96–112)
CO2: 27 mEq/L (ref 19–32)
CREATININE: 0.8 mg/dL (ref 0.4–1.2)
Calcium: 9.4 mg/dL (ref 8.4–10.5)
GFR: 97.33 mL/min (ref >60.00)
GLUCOSE: 111 mg/dL — AB (ref 70–99)
Potassium: 4.1 mEq/L (ref 3.5–5.1)
Sodium: 139 mEq/L (ref 135–145)

## 2014-03-11 LAB — HEPATIC FUNCTION PANEL
ALT: 23 U/L (ref 0–35)
AST: 19 U/L (ref 0–37)
Albumin: 3.9 g/dL (ref 3.5–5.2)
Alkaline Phosphatase: 74 U/L (ref 39–117)
BILIRUBIN DIRECT: 0 mg/dL (ref 0.0–0.3)
Total Bilirubin: 0.4 mg/dL (ref 0.2–1.2)
Total Protein: 7.9 g/dL (ref 6.0–8.3)

## 2014-03-11 LAB — LIPID PANEL
CHOLESTEROL: 178 mg/dL (ref 0–200)
HDL: 57.2 mg/dL (ref >39.00)
LDL Cholesterol: 106 mg/dL — ABNORMAL HIGH (ref 0–99)
NonHDL: 120.8
TRIGLYCERIDES: 72 mg/dL (ref 0.0–149.0)
Total CHOL/HDL Ratio: 3
VLDL: 14.4 mg/dL (ref 0.0–40.0)

## 2014-03-11 LAB — HEMOGLOBIN A1C: HEMOGLOBIN A1C: 6.9 % — AB (ref 4.6–6.5)

## 2014-03-11 MED ORDER — ATORVASTATIN CALCIUM 40 MG PO TABS: 40.0000 mg | ORAL_TABLET | Freq: Every day | ORAL | Status: DC

## 2014-03-11 NOTE — Progress Notes (Signed)
Subjective:    Patient ID: Erica Mills, female    DOB: [birth year], 58 y.o.   MRN: [id number]  HPI  Here to f/u; overall doing ok,  Pt denies chest pain, increased sob or doe, wheezing, orthopnea, PND, increased LE swelling, palpitations, dizziness or syncope.  Pt denies polydipsia, polyuria, or low sugar symptoms such as weakness or confusion improved with po intake.  Pt denies new neurological symptoms such as new headache, or facial or extremity weakness or numbness.   Pt states overall good compliance with meds, has been trying to follow lower cholesterol, diabetic diet, with wt overall stable,  but little exercise however. Also with mild recurrent left CTS symptoms, numbness left hand, no pain or weakness Past Medical History  Diagnosis Date  . ACHILLES TENDINITIS 10/06/2009  . ALLERGIC RHINITIS 05/19/2007  . CARPAL TUNNEL SYNDROME, BILATERAL 05/19/2007  . ELBOW PAIN, RIGHT 04/21/2008  . GLUCOSE INTOLERANCE 02/14/2009  . HYPERLIPIDEMIA 05/19/2007  . HYPERTENSION 05/19/2007  . HYPERTHYROIDISM 03/10/2009  . Lateral epicondylitis  of elbow 04/21/2008  . SINUSITIS- ACUTE-NOS 05/11/2010  . SMOKER 05/19/2007  . Fibroids   . H/O menorrhagia   . Cyst of breast, right, solitary 07/2012   Past Surgical History  Procedure Laterality Date  . Tonsillectomy    . Cesarean section      reports that she has quit smoking. She has never used smokeless tobacco. She reports that she does not drink alcohol or use illicit drugs. family history includes Cancer in her paternal uncle; Diabetes in her father, mother, and sister; Hypertension in her father and mother. Allergies  Allergen Reactions  . Sulfonamide Derivatives     REACTION: rash   Current Outpatient Prescriptions on File Prior to Visit  Medication Sig Dispense Refill  . amLODipine (NORVASC) 10 MG tablet Take 1 tablet (10 mg total) by mouth daily.  90 tablet  3  . aspirin 81 MG tablet Take 81 mg by mouth daily.        . irbesartan (AVAPRO) 150 MG  tablet Take 1 tablet (150 mg total) by mouth daily.  90 tablet  3  . naproxen (NAPROSYN) 500 MG tablet TAKE 1 TABLET (500 MG TOTAL) BY MOUTH 2 (TWO) TIMES DAILY.  180 tablet  3  . neomycin-polymyxin-hydrocortisone (CORTISPORIN) otic solution Place 4 drops into the right ear every 4 (four) hours.  10 mL  0  . sertraline (ZOLOFT) 50 MG tablet Take 1 tablet (50 mg total) by mouth daily.  90 tablet  3  . triamcinolone cream (KENALOG) 0.1 % Apply 1 application topically 4 (four) times daily. As needed for rash  30 g  0  . fexofenadine (ALLEGRA) 180 MG tablet Take 1 tablet (180 mg total) by mouth daily.  30 tablet  2  . fluticasone (FLONASE) 50 MCG/ACT nasal spray Place 2 sprays into the nose daily.  16 g  5   No current facility-administered medications on file prior to visit.   Review of Systems  Constitutional: Negative for unusual diaphoresis or other sweats  HENT: Negative for ringing in ear Eyes: Negative for double vision or worsening visual disturbance.  Respiratory: Negative for choking and stridor.   Gastrointestinal: Negative for vomiting or other signifcant bowel change Genitourinary: Negative for hematuria or decreased urine volume.  Musculoskeletal: Negative for other MSK pain or swelling Skin: Negative for color change and worsening wound.  Neurological: Negative for tremors and numbness other than noted  Psychiatric/Behavioral: Negative for decreased concentration or agitation other than  above       Objective:   Physical Exam BP 120/76  Pulse 91  Temp(Src) 98 F (36.7 C) (Oral)  Wt 200 lb 2 oz (90.776 kg)  SpO2 96% VS noted,  Constitutional: Pt appears well-developed, well-nourished.  HENT: Head: NCAT.  Right Ear: External ear normal.  Left Ear: External ear normal.  Eyes: . Pupils are equal, round, and reactive to light. Conjunctivae and EOM are normal Neck: Normal range of motion. Neck supple.  Cardiovascular: Normal rate and regular rhythm.   Pulmonary/Chest:  Effort normal and breath sounds normal.  Neurological: Pt is alert. Not confused , motor 5/5 intact Skin: Skin is warm. No rash Psychiatric: Pt behavior is normal. No agitation.     Assessment & Plan:

## 2014-03-11 NOTE — Progress Notes (Signed)
Pre visit review using our clinic review tool, if applicable. No additional management support is needed unless otherwise documented below in the visit note. 

## 2014-03-11 NOTE — Patient Instructions (Signed)
Erica Mills will look for a left wrist splint to wear at night, or this can be purchased at many drug stores  OK to increase the lipitor to 40 mg per day  Please continue all other medications as before, and refills have been done if requested.  Please have the pharmacy call with any other refills you may need.  Please keep your appointments with your specialists as you may have planned  Please go to the LAB in the Basement (turn left off the elevator) for the tests to be done today  You will be contacted by phone if any changes need to be made immediately.  Otherwise, you will receive a letter about your results with an explanation, but please check with MyChart first.  Please remember to sign up for MyChart if you have not done so, as this will be important to you in the future with finding out test results, communicating by private email, and scheduling acute appointments online when needed.  Please return in 6 months, or sooner if needed, with Lab testing done 3-5 days before

## 2014-03-13 NOTE — Assessment & Plan Note (Signed)
stable overall by history and exam, recent data reviewed with pt, and pt to continue medical treatment as before,  to f/u any worsening symptoms or concerns BP Readings from Last 3 Encounters:  03/11/14 120/76  11/12/13 130/88  08/13/13 122/84

## 2014-03-13 NOTE — Assessment & Plan Note (Signed)
stable overall by history and exam, recent data reviewed with pt, and pt to continue medical treatment as before,  to f/u any worsening symptoms or concerns Lab Results  Component Value Date   HGBA1C 6.9* 03/11/2014

## 2014-03-13 NOTE — Assessment & Plan Note (Signed)
Mild, for left wrist splint qhs prn,  to f/u any worsening symptoms or concerns  

## 2014-03-13 NOTE — Assessment & Plan Note (Addendum)
stable overall by history and exam, recent data reviewed with pt, and pt to increase the lipitor to 40 mg, cont lower chol diet,  to f/u any worsening symptoms or concerns Lab Results  Component Value Date   LDLCALC 106* 03/11/2014

## 2014-03-14 ENCOUNTER — Telehealth: Payer: Self-pay | Admitting: Internal Medicine

## 2014-03-14 NOTE — Telephone Encounter (Signed)
emmi emailed °

## 2014-04-11 ENCOUNTER — Encounter: Payer: Self-pay | Admitting: Internal Medicine

## 2014-05-13 ENCOUNTER — Ambulatory Visit (INDEPENDENT_AMBULATORY_CARE_PROVIDER_SITE_OTHER): Payer: BC Managed Care – PPO | Admitting: Endocrinology

## 2014-05-13 ENCOUNTER — Encounter: Payer: Self-pay | Admitting: Endocrinology

## 2014-05-13 VITALS — BP 140/92 | HR 89 | Temp 98.2°F | Ht 64.5 in | Wt 196.0 lb

## 2014-05-13 DIAGNOSIS — E0520 Thyrotoxicosis with toxic multinodular goiter without thyrotoxic crisis or storm: Secondary | ICD-10-CM

## 2014-05-14 NOTE — Progress Notes (Signed)
Subjective:    Patient ID: Erica Mills, female    DOB: [birth year], 58 y.o.   MRN: [id number]  HPI Pt returns for f/u of hyperthyroidism due to multinodular goiter (dx'ed 2010; scan in 2010 showed low uptake; abnormal tsh resolved in 2011, but it recurred in 2013; she had nuc med scan (showed normal uptake, and multinodular pattern), in preparation for i-131; US showed 5 cm right lobe mass; however, she not get i-131, as she did not have insurance; she eventually had i-131 rx in august of 2014; TSH normalized, and she has not needed synthroid).  pt states she feels well in general. Pt states 1 week of slight pain at both ears, and assoc congestion. Past Medical History  Diagnosis Date  . ACHILLES TENDINITIS 10/06/2009  . ALLERGIC RHINITIS 05/19/2007  . CARPAL TUNNEL SYNDROME, BILATERAL 05/19/2007  . ELBOW PAIN, RIGHT 04/21/2008  . GLUCOSE INTOLERANCE 02/14/2009  . HYPERLIPIDEMIA 05/19/2007  . HYPERTENSION 05/19/2007  . HYPERTHYROIDISM 03/10/2009  . Lateral epicondylitis  of elbow 04/21/2008  . SINUSITIS- ACUTE-NOS 05/11/2010  . SMOKER 05/19/2007  . Fibroids   . H/O menorrhagia   . Cyst of breast, right, solitary 07/2012    Past Surgical History  Procedure Laterality Date  . Tonsillectomy    . Cesarean section      History   Social History  . Marital Status: Married    Spouse Name: N/A    Number of Children: N/A  . Years of Education: N/A   Occupational History  . Not on file.   Social History Main Topics  . Smoking status: Former Research scientist (life sciences)  . Smokeless tobacco: Never Used     Comment: QUIT SMOKING X 4 WEEKS  . Alcohol Use: No  . Drug Use: No  . Sexual Activity: Yes    Birth Control/ Protection: Surgical, Post-menopausal     Comment: BTL   Other Topics Concern  . Not on file   Social History Narrative    Current Outpatient Prescriptions on File Prior to Visit  Medication Sig Dispense Refill  . amLODipine (NORVASC) 10 MG tablet Take 1 tablet (10 mg total) by mouth  daily. 90 tablet 3  . aspirin 81 MG tablet Take 81 mg by mouth daily.      Marland Kitchen atorvastatin (LIPITOR) 40 MG tablet Take 1 tablet (40 mg total) by mouth daily. 90 tablet 3  . irbesartan (AVAPRO) 150 MG tablet Take 1 tablet (150 mg total) by mouth daily. 90 tablet 3  . naproxen (NAPROSYN) 500 MG tablet TAKE 1 TABLET (500 MG TOTAL) BY MOUTH 2 (TWO) TIMES DAILY. 180 tablet 3  . neomycin-polymyxin-hydrocortisone (CORTISPORIN) otic solution Place 4 drops into the right ear every 4 (four) hours. 10 mL 0  . sertraline (ZOLOFT) 50 MG tablet Take 1 tablet (50 mg total) by mouth daily. 90 tablet 3  . triamcinolone cream (KENALOG) 0.1 % Apply 1 application topically 4 (four) times daily. As needed for rash 30 g 0  . fexofenadine (ALLEGRA) 180 MG tablet Take 1 tablet (180 mg total) by mouth daily. 30 tablet 2  . fluticasone (FLONASE) 50 MCG/ACT nasal spray Place 2 sprays into the nose daily. 16 g 5   No current facility-administered medications on file prior to visit.    Allergies  Allergen Reactions  . Sulfonamide Derivatives     REACTION: rash    Family History  Problem Relation Age of Onset  . Hypertension Mother   . Diabetes Mother   . Diabetes Father   .  Hypertension Father   . Cancer Paternal Uncle     colon cancer  . Diabetes Sister     BP 140/92 mmHg  Pulse 89  Temp(Src) 98.2 F (36.8 C) (Oral)  Ht 5' 4.5" (1.638 m)  Wt 196 lb (88.905 kg)  BMI 33.14 kg/m2  SpO2 97%    Review of Systems Denies weight change and fever    Objective:   Physical Exam VITAL SIGNS:  See vs page GENERAL: no distress head: no deformity eyes: no periorbital swelling, no proptosis external nose and ears are normal mouth: no lesion seen Both tm's are slightly red NECK: There is no palpable thyroid enlargement.  No thyroid nodule is palpable.  No palpable lymphadenopathy at the anterior neck.       Assessment & Plan:  Post-I-131 hypothyroidism, on rx.  Check TSH.  Please come back for a  follow-up appointment in 6 weeks. URI, new.  i rx'ed ceftin.  (no avs, as computer system is down at time of ov)

## 2014-05-16 ENCOUNTER — Other Ambulatory Visit: Payer: BC Managed Care – PPO

## 2014-05-16 ENCOUNTER — Other Ambulatory Visit (INDEPENDENT_AMBULATORY_CARE_PROVIDER_SITE_OTHER): Payer: BC Managed Care – PPO

## 2014-05-16 DIAGNOSIS — Z0189 Encounter for other specified special examinations: Secondary | ICD-10-CM

## 2014-05-16 LAB — LIPID PANEL
Cholesterol: 162 mg/dL (ref 0–200)
HDL: 58.8 mg/dL (ref >39.00)
LDL Cholesterol: 80 mg/dL (ref 0–99)
NonHDL: 103.2
TRIGLYCERIDES: 117 mg/dL (ref 0.0–149.0)
Total CHOL/HDL Ratio: 3
VLDL: 23.4 mg/dL (ref 0.0–40.0)

## 2014-05-16 LAB — BASIC METABOLIC PANEL
BUN: 16 mg/dL (ref 6–23)
CHLORIDE: 107 meq/L (ref 96–112)
CO2: 23 mEq/L (ref 19–32)
Calcium: 9.3 mg/dL (ref 8.4–10.5)
Creatinine, Ser: 0.9 mg/dL (ref 0.4–1.2)
GFR: 78.42 mL/min (ref >60.00)
Glucose, Bld: 162 mg/dL — ABNORMAL HIGH (ref 70–99)
POTASSIUM: 4.4 meq/L (ref 3.5–5.1)
SODIUM: 140 meq/L (ref 135–145)

## 2014-05-16 LAB — TSH: TSH: 2.44 u[IU]/mL (ref 0.35–4.50)

## 2014-05-16 LAB — HEPATIC FUNCTION PANEL
ALK PHOS: 68 U/L (ref 39–117)
ALT: 24 U/L (ref 0–35)
AST: 25 U/L (ref 0–37)
Albumin: 4 g/dL (ref 3.5–5.2)
BILIRUBIN DIRECT: 0 mg/dL (ref 0.0–0.3)
Total Bilirubin: 0.4 mg/dL (ref 0.2–1.2)
Total Protein: 7.2 g/dL (ref 6.0–8.3)

## 2014-06-12 ENCOUNTER — Other Ambulatory Visit: Payer: Self-pay | Admitting: Internal Medicine

## 2014-06-24 ENCOUNTER — Encounter: Payer: Self-pay | Admitting: Endocrinology

## 2014-06-24 ENCOUNTER — Ambulatory Visit (INDEPENDENT_AMBULATORY_CARE_PROVIDER_SITE_OTHER): Payer: BLUE CROSS/BLUE SHIELD | Admitting: Endocrinology

## 2014-06-24 VITALS — BP 128/86 | HR 88 | Temp 97.9°F | Ht 64.5 in | Wt 194.0 lb

## 2014-06-24 DIAGNOSIS — E042 Nontoxic multinodular goiter: Secondary | ICD-10-CM

## 2014-06-24 LAB — TSH: TSH: 3.34 u[IU]/mL (ref 0.35–4.50)

## 2014-06-24 LAB — T4, FREE: FREE T4: 0.51 ng/dL — AB (ref 0.60–1.60)

## 2014-06-24 NOTE — Patient Instructions (Addendum)
blood tests are being requested for you today.  We'll let you know about the results. Please come back for a follow-up appointment in 2 months.

## 2014-06-24 NOTE — Progress Notes (Signed)
Subjective:    Patient ID: Erica Mills, female    DOB: 05/27/1956, 59 y.o.   MRN: [id number]  HPI Pt returns for f/u of hyperthyroidism due to multinodular goiter (dx'ed 2010; scan in 2010 showed low uptake; abnormal tsh resolved in 2011, but it recurred in 2013; she had nuc med scan (showed normal uptake, and multinodular pattern), in preparation for i-131; US showed 5 cm right lobe mass; however, she not get i-131, as she did not have insurance; she eventually had 2 doses of i-131 rx: in august of 2014 and sept of 2015).  pt states she feels well in general.   Past Medical History  Diagnosis Date  . ACHILLES TENDINITIS 10/06/2009  . ALLERGIC RHINITIS 05/19/2007  . CARPAL TUNNEL SYNDROME, BILATERAL 05/19/2007  . ELBOW PAIN, RIGHT 04/21/2008  . GLUCOSE INTOLERANCE 02/14/2009  . HYPERLIPIDEMIA 05/19/2007  . HYPERTENSION 05/19/2007  . HYPERTHYROIDISM 03/10/2009  . Lateral epicondylitis  of elbow 04/21/2008  . SINUSITIS- ACUTE-NOS 05/11/2010  . SMOKER 05/19/2007  . Fibroids   . H/O menorrhagia   . Cyst of breast, right, solitary 07/2012    Past Surgical History  Procedure Laterality Date  . Tonsillectomy    . Cesarean section      History   Social History  . Marital Status: Married    Spouse Name: N/A    Number of Children: N/A  . Years of Education: N/A   Occupational History  . Not on file.   Social History Main Topics  . Smoking status: Former Research scientist (life sciences)  . Smokeless tobacco: Never Used     Comment: QUIT SMOKING X 4 WEEKS  . Alcohol Use: No  . Drug Use: No  . Sexual Activity: Yes    Birth Control/ Protection: Surgical, Post-menopausal     Comment: BTL   Other Topics Concern  . Not on file   Social History Narrative    Current Outpatient Prescriptions on File Prior to Visit  Medication Sig Dispense Refill  . amLODipine (NORVASC) 10 MG tablet Take 1 tablet (10 mg total) by mouth daily. 90 tablet 3  . aspirin 81 MG tablet Take 81 mg by mouth daily.      Marland Kitchen atorvastatin  (LIPITOR) 40 MG tablet Take 1 tablet (40 mg total) by mouth daily. 90 tablet 3  . irbesartan (AVAPRO) 150 MG tablet TAKE 1 TABLET (150 MG TOTAL) BY MOUTH DAILY. 90 tablet 2  . naproxen (NAPROSYN) 500 MG tablet TAKE 1 TABLET (500 MG TOTAL) BY MOUTH 2 (TWO) TIMES DAILY. 180 tablet 3  . neomycin-polymyxin-hydrocortisone (CORTISPORIN) otic solution Place 4 drops into the right ear every 4 (four) hours. 10 mL 0  . sertraline (ZOLOFT) 50 MG tablet Take 1 tablet (50 mg total) by mouth daily. 90 tablet 3  . triamcinolone cream (KENALOG) 0.1 % Apply 1 application topically 4 (four) times daily. As needed for rash 30 g 0  . fexofenadine (ALLEGRA) 180 MG tablet Take 1 tablet (180 mg total) by mouth daily. 30 tablet 2  . fluticasone (FLONASE) 50 MCG/ACT nasal spray Place 2 sprays into the nose daily. 16 g 5   No current facility-administered medications on file prior to visit.    Allergies  Allergen Reactions  . Sulfonamide Derivatives     REACTION: rash    Family History  Problem Relation Age of Onset  . Hypertension Mother   . Diabetes Mother   . Diabetes Father   . Hypertension Father   . Cancer Paternal Uncle  colon cancer  . Diabetes Sister     BP 128/86 mmHg  Pulse 88  Temp(Src) 97.9 F (36.6 C) (Oral)  Ht 5' 4.5" (1.638 m)  Wt 194 lb (87.998 kg)  BMI 32.80 kg/m2  SpO2 96%   Review of Systems Denies weight change    Objective:   Physical Exam VITAL SIGNS:  See vs page GENERAL: no distress NECK: i think i can feel the top of a goiter.     Lab Results  Component Value Date   TSH 3.34 06/24/2014       Assessment & Plan:  Hyperthyroidism: resolved with I-131 rx.  Patient is advised the following: Patient Instructions  blood tests are being requested for you today.  We'll let you know about the results. Please come back for a follow-up appointment in 2 months.

## 2014-08-26 ENCOUNTER — Encounter: Payer: Self-pay | Admitting: Endocrinology

## 2014-08-26 ENCOUNTER — Ambulatory Visit (INDEPENDENT_AMBULATORY_CARE_PROVIDER_SITE_OTHER): Payer: BLUE CROSS/BLUE SHIELD | Admitting: Endocrinology

## 2014-08-26 ENCOUNTER — Other Ambulatory Visit: Payer: Self-pay

## 2014-08-26 VITALS — BP 120/74 | Temp 98.1°F | Wt 196.8 lb

## 2014-08-26 DIAGNOSIS — E042 Nontoxic multinodular goiter: Secondary | ICD-10-CM

## 2014-08-26 DIAGNOSIS — Z1231 Encounter for screening mammogram for malignant neoplasm of breast: Secondary | ICD-10-CM

## 2014-08-26 DIAGNOSIS — E119 Type 2 diabetes mellitus without complications: Secondary | ICD-10-CM

## 2014-08-26 LAB — T4, FREE: Free T4: 0.57 ng/dL — ABNORMAL LOW (ref 0.60–1.60)

## 2014-08-26 LAB — TSH: TSH: 3.11 u[IU]/mL (ref 0.35–4.50)

## 2014-08-26 LAB — HEMOGLOBIN A1C: Hgb A1c MFr Bld: 7 % — ABNORMAL HIGH (ref 4.6–6.5)

## 2014-08-26 NOTE — Patient Instructions (Addendum)
blood tests are being requested for you today.  We'll let you know about the results. Please come back for a follow-up appointment in 3-4 months.

## 2014-08-26 NOTE — Progress Notes (Signed)
Subjective:    Patient ID: Erica Mills, female    DOB: 1956/02/13, 59 y.o.   MRN: [id number]  HPI Pt returns for f/u of hyperthyroidism due to multinodular goiter (dx'ed 2010; scan in 2010 showed low uptake; abnormal tsh resolved in 2011, but it recurred in 2013; she had nuc med scan (showed normal uptake, and multinodular pattern), in preparation for i-131; US showed 5 cm right lobe mass; however, she not get i-131, as she did not have insurance; she eventually had 2 doses of i-131 rx: in august of 2014 and sept of 2015).  pt states she feels well in general.  She does not notice the goiter.  Pt requests an a1c also.  Past Medical History  Diagnosis Date  . ACHILLES TENDINITIS 10/06/2009  . ALLERGIC RHINITIS 05/19/2007  . CARPAL TUNNEL SYNDROME, BILATERAL 05/19/2007  . ELBOW PAIN, RIGHT 04/21/2008  . GLUCOSE INTOLERANCE 02/14/2009  . HYPERLIPIDEMIA 05/19/2007  . HYPERTENSION 05/19/2007  . HYPERTHYROIDISM 03/10/2009  . Lateral epicondylitis  of elbow 04/21/2008  . SINUSITIS- ACUTE-NOS 05/11/2010  . SMOKER 05/19/2007  . Fibroids   . H/O menorrhagia   . Cyst of breast, right, solitary 07/2012    Past Surgical History  Procedure Laterality Date  . Tonsillectomy    . Cesarean section      History   Social History  . Marital Status: Married    Spouse Name: N/A  . Number of Children: N/A  . Years of Education: N/A   Occupational History  . Not on file.   Social History Main Topics  . Smoking status: Former Research scientist (life sciences)  . Smokeless tobacco: Never Used     Comment: QUIT SMOKING X 4 WEEKS  . Alcohol Use: No  . Drug Use: No  . Sexual Activity: Yes    Birth Control/ Protection: Surgical, Post-menopausal     Comment: BTL   Other Topics Concern  . Not on file   Social History Narrative    Current Outpatient Prescriptions on File Prior to Visit  Medication Sig Dispense Refill  . amLODipine (NORVASC) 10 MG tablet Take 1 tablet (10 mg total) by mouth daily. 90 tablet 3  . aspirin 81  MG tablet Take 81 mg by mouth daily.      Marland Kitchen atorvastatin (LIPITOR) 40 MG tablet Take 1 tablet (40 mg total) by mouth daily. 90 tablet 3  . irbesartan (AVAPRO) 150 MG tablet TAKE 1 TABLET (150 MG TOTAL) BY MOUTH DAILY. 90 tablet 2  . naproxen (NAPROSYN) 500 MG tablet TAKE 1 TABLET (500 MG TOTAL) BY MOUTH 2 (TWO) TIMES DAILY. 180 tablet 3  . neomycin-polymyxin-hydrocortisone (CORTISPORIN) otic solution Place 4 drops into the right ear every 4 (four) hours. 10 mL 0  . sertraline (ZOLOFT) 50 MG tablet Take 1 tablet (50 mg total) by mouth daily. 90 tablet 3  . triamcinolone cream (KENALOG) 0.1 % Apply 1 application topically 4 (four) times daily. As needed for rash 30 g 0  . fexofenadine (ALLEGRA) 180 MG tablet Take 1 tablet (180 mg total) by mouth daily. 30 tablet 2  . fluticasone (FLONASE) 50 MCG/ACT nasal spray Place 2 sprays into the nose daily. 16 g 5   No current facility-administered medications on file prior to visit.    Allergies  Allergen Reactions  . Sulfonamide Derivatives     REACTION: rash    Family History  Problem Relation Age of Onset  . Hypertension Mother   . Diabetes Mother   . Diabetes Father   .  Hypertension Father   . Cancer Paternal Uncle     colon cancer  . Diabetes Sister     BP 120/74 mmHg  Temp(Src) 98.1 F (36.7 C) (Oral)  Wt 196 lb 12.8 oz (89.268 kg)  Review of Systems Denies tremor.  She has gained weight.      Objective:   Physical Exam VITAL SIGNS:  See vs page GENERAL: no distress NECK: thyroid is several time normal size, but i can't tell details.     Lab Results  Component Value Date   TSH 3.11 08/26/2014   Lab Results  Component Value Date   HGBA1C 7.0* 08/26/2014      Assessment & Plan:  Hyperthyroidism, resolved with I-131 DM: well-controlled  Patient is advised the following: Patient Instructions  blood tests are being requested for you today.  We'll let you know about the results. Please come back for a follow-up  appointment in 3-4 months.

## 2014-09-09 ENCOUNTER — Ambulatory Visit (INDEPENDENT_AMBULATORY_CARE_PROVIDER_SITE_OTHER): Payer: BLUE CROSS/BLUE SHIELD | Admitting: Internal Medicine

## 2014-09-09 ENCOUNTER — Encounter: Payer: Self-pay | Admitting: Internal Medicine

## 2014-09-09 ENCOUNTER — Other Ambulatory Visit (INDEPENDENT_AMBULATORY_CARE_PROVIDER_SITE_OTHER): Payer: BLUE CROSS/BLUE SHIELD

## 2014-09-09 VITALS — BP 122/80 | HR 83 | Temp 97.8°F | Resp 18 | Ht 65.0 in | Wt 197.0 lb

## 2014-09-09 DIAGNOSIS — E119 Type 2 diabetes mellitus without complications: Secondary | ICD-10-CM

## 2014-09-09 DIAGNOSIS — Z23 Encounter for immunization: Secondary | ICD-10-CM

## 2014-09-09 DIAGNOSIS — Z0000 Encounter for general adult medical examination without abnormal findings: Secondary | ICD-10-CM | POA: Diagnosis not present

## 2014-09-09 LAB — HEPATIC FUNCTION PANEL
ALT: 21 U/L (ref 0–35)
AST: 18 U/L (ref 0–37)
Albumin: 4.2 g/dL (ref 3.5–5.2)
Alkaline Phosphatase: 80 U/L (ref 39–117)
BILIRUBIN TOTAL: 0.4 mg/dL (ref 0.2–1.2)
Bilirubin, Direct: 0.1 mg/dL (ref 0.0–0.3)
Total Protein: 7.4 g/dL (ref 6.0–8.3)

## 2014-09-09 LAB — BASIC METABOLIC PANEL
BUN: 13 mg/dL (ref 6–23)
CALCIUM: 9.7 mg/dL (ref 8.4–10.5)
CO2: 30 meq/L (ref 19–32)
Chloride: 105 mEq/L (ref 96–112)
Creatinine, Ser: 0.77 mg/dL (ref 0.40–1.20)
GFR: 98.62 mL/min (ref >60.00)
GLUCOSE: 116 mg/dL — AB (ref 70–99)
Potassium: 3.8 mEq/L (ref 3.5–5.1)
Sodium: 139 mEq/L (ref 135–145)

## 2014-09-09 LAB — CBC WITH DIFFERENTIAL/PLATELET
BASOS ABS: 0.1 10*3/uL (ref 0.0–0.1)
Basophils Relative: 0.6 % (ref 0.0–3.0)
EOS ABS: 0.3 10*3/uL (ref 0.0–0.7)
Eosinophils Relative: 3.1 % (ref 0.0–5.0)
HCT: 37.4 % (ref 36.0–46.0)
Hemoglobin: 12.7 g/dL (ref 12.0–15.0)
LYMPHS ABS: 2.4 10*3/uL (ref 0.7–4.0)
LYMPHS PCT: 26.7 % (ref 12.0–46.0)
MCHC: 34 g/dL (ref 30.0–36.0)
MCV: 86.2 fl (ref 78.0–100.0)
Monocytes Absolute: 0.6 10*3/uL (ref 0.1–1.0)
Monocytes Relative: 6.5 % (ref 3.0–12.0)
NEUTROS ABS: 5.6 10*3/uL (ref 1.4–7.7)
Neutrophils Relative %: 63.1 % (ref 43.0–77.0)
PLATELETS: 255 10*3/uL (ref 150.0–400.0)
RBC: 4.34 Mil/uL (ref 3.87–5.11)
RDW: 16.1 % — AB (ref 11.5–15.5)
WBC: 8.9 10*3/uL (ref 4.0–10.5)

## 2014-09-09 LAB — URINALYSIS, ROUTINE W REFLEX MICROSCOPIC
Bilirubin Urine: NEGATIVE
Hgb urine dipstick: NEGATIVE
Ketones, ur: NEGATIVE
Nitrite: NEGATIVE
RBC / HPF: NONE SEEN (ref 0–?)
SPECIFIC GRAVITY, URINE: 1.02 (ref 1.000–1.030)
Total Protein, Urine: NEGATIVE
UROBILINOGEN UA: 0.2 (ref 0.0–1.0)
Urine Glucose: NEGATIVE
pH: 6 (ref 5.0–8.0)

## 2014-09-09 LAB — LIPID PANEL
CHOL/HDL RATIO: 3
Cholesterol: 179 mg/dL (ref 0–200)
HDL: 60.1 mg/dL (ref >39.00)
LDL CALC: 101 mg/dL — AB (ref 0–99)
NonHDL: 118.9
TRIGLYCERIDES: 89 mg/dL (ref 0.0–149.0)
VLDL: 17.8 mg/dL (ref 0.0–40.0)

## 2014-09-09 LAB — MICROALBUMIN / CREATININE URINE RATIO
CREATININE, U: 108.6 mg/dL
MICROALB/CREAT RATIO: 0.6 mg/g (ref 0.0–30.0)
Microalb, Ur: <0.7 mg/dL (ref 0.0–1.9)

## 2014-09-09 MED ORDER — METFORMIN HCL ER 500 MG PO TB24: 500.0000 mg | ORAL_TABLET | Freq: Every day | ORAL | Status: DC

## 2014-09-09 MED ORDER — ATORVASTATIN CALCIUM 40 MG PO TABS: 40.0000 mg | ORAL_TABLET | Freq: Every day | ORAL | Status: DC

## 2014-09-09 NOTE — Patient Instructions (Addendum)
You had the Pneumovax shot today  Please take all new medication as prescribed - the metformin ER 500 mg in the AM  Please continue all other medications as before, and refills have been done if requested.  Please have the pharmacy call with any other refills you may need.  Please continue your efforts at being more active, low cholesterol diet, and weight control.  You are otherwise up to date with prevention measures today.  Please keep your appointments with your specialists as you may have planned  Please go to the LAB in the Basement (turn left off the elevator) for the tests to be done today  You will be contacted by phone if any changes need to be made immediately.  Otherwise, you will receive a letter about your results with an explanation, but please check with MyChart first.  Please remember to sign up for MyChart if you have not done so, as this will be important to you in the future with finding out test results, communicating by private email, and scheduling acute appointments online when needed.  Please return in 6 months, or sooner if needed, with Lab testing done 3-5 days before

## 2014-09-09 NOTE — Progress Notes (Signed)
Subjective:    Patient ID: Erica Mills, female    DOB: [birth year], 59 y.o.   MRN: [id number]  HPI  Here for wellness and f/u;  Overall doing ok;  Pt denies Chest pain, worsening SOB, DOE, wheezing, orthopnea, PND, worsening LE edema, palpitations, dizziness or syncope.  Pt denies neurological change such as new headache, facial or extremity weakness.  Pt denies polydipsia, polyuria, or low sugar symptoms. Pt states overall good compliance with treatment and medications, good tolerability, and has been trying to follow appropriate diet.  Pt denies worsening depressive symptoms, suicidal ideation or panic. No fever, night sweats, wt loss, loss of appetite, or other constitutional symptoms.  Pt states good ability with ADL's, has low fall risk, home safety reviewed and adequate, no other significant changes in hearing or vision, and only occasionally active with exercise.  No current complaints. Has some DJD changes of the DIPs of first 2 fingers right hand but no pain. Sees endo for thyroid, also had a1c done recent - 7.0 Past Medical History  Diagnosis Date  . ACHILLES TENDINITIS 10/06/2009  . ALLERGIC RHINITIS 05/19/2007  . CARPAL TUNNEL SYNDROME, BILATERAL 05/19/2007  . ELBOW PAIN, RIGHT 04/21/2008  . GLUCOSE INTOLERANCE 02/14/2009  . HYPERLIPIDEMIA 05/19/2007  . HYPERTENSION 05/19/2007  . HYPERTHYROIDISM 03/10/2009  . Lateral epicondylitis  of elbow 04/21/2008  . SINUSITIS- ACUTE-NOS 05/11/2010  . SMOKER 05/19/2007  . Fibroids   . H/O menorrhagia   . Cyst of breast, right, solitary 07/2012   Past Surgical History  Procedure Laterality Date  . Tonsillectomy    . Cesarean section      reports that she has quit smoking. She has never used smokeless tobacco. She reports that she does not drink alcohol or use illicit drugs. family history includes Cancer in her paternal uncle; Diabetes in her father, mother, and sister; Hypertension in her father and mother. Allergies  Allergen Reactions  .  Sulfonamide Derivatives     REACTION: rash   Current Outpatient Prescriptions on File Prior to Visit  Medication Sig Dispense Refill  . amLODipine (NORVASC) 10 MG tablet Take 1 tablet (10 mg total) by mouth daily. 90 tablet 3  . aspirin 81 MG tablet Take 81 mg by mouth daily.      . irbesartan (AVAPRO) 150 MG tablet TAKE 1 TABLET (150 MG TOTAL) BY MOUTH DAILY. 90 tablet 2  . naproxen (NAPROSYN) 500 MG tablet TAKE 1 TABLET (500 MG TOTAL) BY MOUTH 2 (TWO) TIMES DAILY. 180 tablet 3  . neomycin-polymyxin-hydrocortisone (CORTISPORIN) otic solution Place 4 drops into the right ear every 4 (four) hours. 10 mL 0  . sertraline (ZOLOFT) 50 MG tablet Take 1 tablet (50 mg total) by mouth daily. 90 tablet 3  . triamcinolone cream (KENALOG) 0.1 % Apply 1 application topically 4 (four) times daily. As needed for rash 30 g 0  . fexofenadine (ALLEGRA) 180 MG tablet Take 1 tablet (180 mg total) by mouth daily. 30 tablet 2  . fluticasone (FLONASE) 50 MCG/ACT nasal spray Place 2 sprays into the nose daily. 16 g 5   No current facility-administered medications on file prior to visit.   Review of Systems Constitutional: Negative for increased diaphoresis, other activity, appetite or siginficant weight change other than noted HENT: Negative for worsening hearing loss, ear pain, facial swelling, mouth sores and neck stiffness.   Eyes: Negative for other worsening pain, redness or visual disturbance.  Respiratory: Negative for shortness of breath and wheezing  Cardiovascular: Negative for  chest pain and palpitations.  Gastrointestinal: Negative for diarrhea, blood in stool, abdominal distention or other pain Genitourinary: Negative for hematuria, flank pain or change in urine volume.  Musculoskeletal: Negative for myalgias or other joint complaints.  Skin: Negative for color change and wound or drainage.  Neurological: Negative for syncope and numbness. other than noted Hematological: Negative for adenopathy. or  other swelling Psychiatric/Behavioral: Negative for hallucinations, SI, self-injury, decreased concentration or other worsening agitation.      Objective:   Physical Exam BP 122/80 mmHg  Pulse 83  Temp(Src) 97.8 F (36.6 C) (Oral)  Resp 18  Ht 5\' 5"  (1.651 m)  Wt 197 lb (89.359 kg)  BMI 32.78 kg/m2  SpO2 97% VS noted,  Constitutional: Pt is oriented to person, place, and time. Appears well-developed and well-nourished, in no significant distress Head: Normocephalic and atraumatic.  Right Ear: External ear normal.  Left Ear: External ear normal.  Nose: Nose normal.  Mouth/Throat: Oropharynx is clear and moist.  Eyes: Conjunctivae and EOM are normal. Pupils are equal, round, and reactive to light.  Neck: Normal range of motion. Neck supple. No JVD present. No tracheal deviation present or significant neck LA or mass Cardiovascular: Normal rate, regular rhythm, normal heart sounds and intact distal pulses.   Pulmonary/Chest: Effort normal and breath sounds without rales or wheezing  Abdominal: Soft. Bowel sounds are normal. NT. No HSM  Musculoskeletal: Normal range of motion. Exhibits no edema.  Lymphadenopathy:  Has no cervical adenopathy.  Neurological: Pt is alert and oriented to person, place, and time. Pt has normal reflexes. No cranial nerve deficit. Motor grossly intact Skin: Skin is warm and dry. No rash noted.  Psychiatric:  Has normal mood and affect. Behavior is normal.     Assessment & Plan:

## 2014-09-09 NOTE — Assessment & Plan Note (Signed)
Mild increased a1c - for metformin Er 500 qd, f/u labs next visit

## 2014-09-09 NOTE — Assessment & Plan Note (Signed)

## 2014-09-09 NOTE — Progress Notes (Signed)
Pre visit review using our clinic review tool, if applicable. No additional management support is needed unless otherwise documented below in the visit note. 

## 2014-10-14 ENCOUNTER — Ambulatory Visit
Admission: RE | Admit: 2014-10-14 | Discharge: 2014-10-14 | Disposition: A | Discharge status: Home / Self Care | Payer: 59 | Source: Ambulatory Visit

## 2014-10-14 DIAGNOSIS — Z1231 Encounter for screening mammogram for malignant neoplasm of breast: Secondary | ICD-10-CM

## 2014-11-01 ENCOUNTER — Other Ambulatory Visit: Payer: Self-pay | Admitting: Internal Medicine

## 2014-12-01 ENCOUNTER — Other Ambulatory Visit: Payer: Self-pay | Admitting: Internal Medicine

## 2014-12-09 ENCOUNTER — Ambulatory Visit: Payer: BLUE CROSS/BLUE SHIELD | Admitting: Endocrinology

## 2014-12-16 ENCOUNTER — Encounter: Payer: Self-pay | Admitting: Endocrinology

## 2014-12-16 ENCOUNTER — Ambulatory Visit (INDEPENDENT_AMBULATORY_CARE_PROVIDER_SITE_OTHER): Payer: 59 | Admitting: Endocrinology

## 2014-12-16 VITALS — BP 120/82 | HR 85 | Temp 97.9°F | Ht 65.0 in | Wt 188.0 lb

## 2014-12-16 DIAGNOSIS — E0520 Thyrotoxicosis with toxic multinodular goiter without thyrotoxic crisis or storm: Secondary | ICD-10-CM | POA: Diagnosis not present

## 2014-12-16 LAB — TSH: TSH: 3.59 u[IU]/mL (ref 0.35–4.50)

## 2014-12-16 NOTE — Patient Instructions (Addendum)
A thyroid blood test is requested for you today.  We'll let you know about the results. Please come back for a follow-up appointment in 4-5 months.

## 2014-12-16 NOTE — Progress Notes (Signed)
Subjective:    Patient ID: Erica Mills, female    DOB: 08/06/1955, 59 y.o.   MRN: [id number]  HPI Pt returns for f/u of hyperthyroidism due to multinodular goiter (dx'ed 2010; scan in 2010 showed low uptake; abnormal tsh resolved in 2011, but it recurred in 2013; she had nuc med scan (showed normal uptake, and multinodular pattern), in preparation for i-131; US showed 5 cm right lobe mass; however, she not get i-131, as she did not have insurance; she eventually had 2 doses of i-131 rx: in august of 2014 and sept of 2015).  pt states she feels well in general.  She does not notice the goiter.   Past Medical History  Diagnosis Date  . ACHILLES TENDINITIS 10/06/2009  . ALLERGIC RHINITIS 05/19/2007  . CARPAL TUNNEL SYNDROME, BILATERAL 05/19/2007  . ELBOW PAIN, RIGHT 04/21/2008  . GLUCOSE INTOLERANCE 02/14/2009  . HYPERLIPIDEMIA 05/19/2007  . HYPERTENSION 05/19/2007  . HYPERTHYROIDISM 03/10/2009  . Lateral epicondylitis  of elbow 04/21/2008  . SINUSITIS- ACUTE-NOS 05/11/2010  . SMOKER 05/19/2007  . Fibroids   . H/O menorrhagia   . Cyst of breast, right, solitary 07/2012    Past Surgical History  Procedure Laterality Date  . Tonsillectomy    . Cesarean section      History   Social History  . Marital Status: Married    Spouse Name: N/A  . Number of Children: N/A  . Years of Education: N/A   Occupational History  . Not on file.   Social History Main Topics  . Smoking status: Former Research scientist (life sciences)  . Smokeless tobacco: Never Used     Comment: QUIT SMOKING X 4 WEEKS  . Alcohol Use: No  . Drug Use: No  . Sexual Activity: Yes    Birth Control/ Protection: Surgical, Post-menopausal     Comment: BTL   Other Topics Concern  . Not on file   Social History Narrative    Current Outpatient Prescriptions on File Prior to Visit  Medication Sig Dispense Refill  . amLODipine (NORVASC) 10 MG tablet TAKE 1 TABLET (10 MG TOTAL) BY MOUTH DAILY. 90 tablet 3  . aspirin 81 MG tablet Take 81 mg by  mouth daily.      Marland Kitchen atorvastatin (LIPITOR) 40 MG tablet Take 1 tablet (40 mg total) by mouth daily. 90 tablet 3  . irbesartan (AVAPRO) 150 MG tablet TAKE 1 TABLET (150 MG TOTAL) BY MOUTH DAILY. 90 tablet 2  . metFORMIN (GLUCOPHAGE XR) 500 MG 24 hr tablet Take 1 tablet (500 mg total) by mouth daily with breakfast. 90 tablet 3  . naproxen (NAPROSYN) 500 MG tablet TAKE 1 TABLET (500 MG TOTAL) BY MOUTH 2 (TWO) TIMES DAILY. 180 tablet 3  . neomycin-polymyxin-hydrocortisone (CORTISPORIN) otic solution Place 4 drops into the right ear every 4 (four) hours. 10 mL 0  . sertraline (ZOLOFT) 50 MG tablet TAKE 1 TABLET (50 MG TOTAL) BY MOUTH DAILY. 90 tablet 0  . triamcinolone cream (KENALOG) 0.1 % Apply 1 application topically 4 (four) times daily. As needed for rash 30 g 0  . fexofenadine (ALLEGRA) 180 MG tablet Take 1 tablet (180 mg total) by mouth daily. 30 tablet 2  . fluticasone (FLONASE) 50 MCG/ACT nasal spray Place 2 sprays into the nose daily. 16 g 5   No current facility-administered medications on file prior to visit.    Allergies  Allergen Reactions  . Sulfonamide Derivatives     REACTION: rash    Family History  Problem  Relation Age of Onset  . Hypertension Mother   . Diabetes Mother   . Diabetes Father   . Hypertension Father   . Cancer Paternal Uncle     colon cancer  . Diabetes Sister     BP 120/82 mmHg  Pulse 85  Temp(Src) 97.9 F (36.6 C) (Oral)  Ht 5\' 5"  (1.651 m)  Wt 188 lb (85.276 kg)  BMI 31.28 kg/m2  SpO2 97%  Review of Systems Denies weight change.      Objective:   Physical Exam VITAL SIGNS:  See vs page GENERAL: no distress NECK: thyroid is slightly enlatged, with multinodular surface.        Assessment & Plan:  Hyperthyroidism, better with I-131 rx, but due for recheck.   Multinodular goiter, clinically stable.  We'll recheck the [place] in the future.    Patient is advised the following: Patient Instructions  A thyroid blood test is requested for  you today.  We'll let you know about the results. Please come back for a follow-up appointment in 4-5 months.

## 2015-02-04 ENCOUNTER — Other Ambulatory Visit: Payer: Self-pay | Admitting: Internal Medicine

## 2015-03-24 ENCOUNTER — Ambulatory Visit (INDEPENDENT_AMBULATORY_CARE_PROVIDER_SITE_OTHER): Payer: Managed Care, Other (non HMO) | Admitting: Internal Medicine

## 2015-03-24 ENCOUNTER — Other Ambulatory Visit: Payer: Managed Care, Other (non HMO)

## 2015-03-24 ENCOUNTER — Encounter: Payer: Self-pay | Admitting: Internal Medicine

## 2015-03-24 VITALS — BP 118/74 | HR 86 | Temp 98.3°F | Ht 65.0 in | Wt 188.0 lb

## 2015-03-24 DIAGNOSIS — M545 Low back pain, unspecified: Secondary | ICD-10-CM

## 2015-03-24 DIAGNOSIS — R21 Rash and other nonspecific skin eruption: Secondary | ICD-10-CM

## 2015-03-24 DIAGNOSIS — Z23 Encounter for immunization: Secondary | ICD-10-CM

## 2015-03-24 DIAGNOSIS — Z0189 Encounter for other specified special examinations: Secondary | ICD-10-CM

## 2015-03-24 DIAGNOSIS — H6980 Other specified disorders of Eustachian tube, unspecified ear: Secondary | ICD-10-CM | POA: Insufficient documentation

## 2015-03-24 DIAGNOSIS — E785 Hyperlipidemia, unspecified: Secondary | ICD-10-CM

## 2015-03-24 DIAGNOSIS — I10 Essential (primary) hypertension: Secondary | ICD-10-CM | POA: Diagnosis not present

## 2015-03-24 DIAGNOSIS — J309 Allergic rhinitis, unspecified: Secondary | ICD-10-CM

## 2015-03-24 DIAGNOSIS — H6983 Other specified disorders of Eustachian tube, bilateral: Secondary | ICD-10-CM

## 2015-03-24 DIAGNOSIS — E119 Type 2 diabetes mellitus without complications: Secondary | ICD-10-CM

## 2015-03-24 DIAGNOSIS — Z0000 Encounter for general adult medical examination without abnormal findings: Secondary | ICD-10-CM

## 2015-03-24 MED ORDER — NAPROXEN 500 MG PO TABS: ORAL_TABLET | ORAL | Status: DC

## 2015-03-24 MED ORDER — CETIRIZINE HCL 10 MG PO TABS: 10.0000 mg | ORAL_TABLET | Freq: Every day | ORAL | Status: AC

## 2015-03-24 MED ORDER — TRIAMCINOLONE ACETONIDE 0.1 % EX CREA: 1.0000 "application " | TOPICAL_CREAM | Freq: Four times a day (QID) | CUTANEOUS | Status: AC

## 2015-03-24 MED ORDER — CYCLOBENZAPRINE HCL 5 MG PO TABS: 5.0000 mg | ORAL_TABLET | Freq: Three times a day (TID) | ORAL | Status: DC | PRN

## 2015-03-24 NOTE — Progress Notes (Signed)
Subjective:    Patient ID: Erica Mills, female    DOB: Jul 30, 1955, 59 y.o.   MRN: [id number]  HPI  Here to f/u; overall doing ok,  Pt denies chest pain, increasing sob or doe, wheezing, orthopnea, PND, increased LE swelling, palpitations, dizziness or syncope.  Pt denies new neurological symptoms such as new headache, or facial or extremity weakness or numbness.  Pt denies polydipsia, polyuria, or low sugar episode.   Pt denies new neurological symptoms such as new headache, or facial or extremity weakness or numbness.   Pt states overall good compliance with meds, mostly trying to follow appropriate diet, with wt overall stable,  but little exercise however.  Pt c/o  LBP without change in severity, bowel or bladder change, fever, wt loss,  worsening LE pain/numbness/weakness, gait change or falls. Does have several wks ongoing nasal allergy symptoms with clearish congestion, itch and sneezing, without fever, pain, ST, cough, swelling or wheezing, with bilat ear aching on occasion. Also with eczema type rash to nape of neck worsening in the past months with itching  Past Medical History  Diagnosis Date  . ACHILLES TENDINITIS 10/06/2009  . ALLERGIC RHINITIS 05/19/2007  . CARPAL TUNNEL SYNDROME, BILATERAL 05/19/2007  . ELBOW PAIN, RIGHT 04/21/2008  . GLUCOSE INTOLERANCE 02/14/2009  . HYPERLIPIDEMIA 05/19/2007  . HYPERTENSION 05/19/2007  . HYPERTHYROIDISM 03/10/2009  . Lateral epicondylitis  of elbow 04/21/2008  . SINUSITIS- ACUTE-NOS 05/11/2010  . SMOKER 05/19/2007  . Fibroids   . H/O menorrhagia   . Cyst of breast, right, solitary 07/2012   Past Surgical History  Procedure Laterality Date  . Tonsillectomy    . Cesarean section      reports that she has quit smoking. She has never used smokeless tobacco. She reports that she does not drink alcohol or use illicit drugs. family history includes Cancer in her paternal uncle; Diabetes in her father, mother, and sister; Hypertension in her father and  mother. Allergies  Allergen Reactions  . Sulfonamide Derivatives     REACTION: rash   Current Outpatient Prescriptions on File Prior to Visit  Medication Sig Dispense Refill  . amLODipine (NORVASC) 10 MG tablet TAKE 1 TABLET (10 MG TOTAL) BY MOUTH DAILY. 90 tablet 3  . aspirin 81 MG tablet Take 81 mg by mouth daily.      Marland Kitchen atorvastatin (LIPITOR) 40 MG tablet Take 1 tablet (40 mg total) by mouth daily. 90 tablet 3  . irbesartan (AVAPRO) 150 MG tablet TAKE 1 TABLET (150 MG TOTAL) BY MOUTH DAILY. 90 tablet 2  . metFORMIN (GLUCOPHAGE XR) 500 MG 24 hr tablet Take 1 tablet (500 mg total) by mouth daily with breakfast. 90 tablet 3  . sertraline (ZOLOFT) 50 MG tablet TAKE 1 TABLET (50 MG TOTAL) BY MOUTH DAILY. 90 tablet 1  . fluticasone (FLONASE) 50 MCG/ACT nasal spray Place 2 sprays into the nose daily. 16 g 5   No current facility-administered medications on file prior to visit.   Review of Systems  Constitutional: Negative for unusual diaphoresis or night sweats HENT: Negative for ringing in ear or discharge Eyes: Negative for double vision or worsening visual disturbance.  Respiratory: Negative for choking and stridor.   Gastrointestinal: Negative for vomiting or other signifcant bowel change Genitourinary: Negative for hematuria or change in urine volume.  Musculoskeletal: Negative for other MSK pain or swelling Skin: Negative for color change and worsening wound.  Neurological: Negative for tremors and numbness other than noted  Psychiatric/Behavioral: Negative for decreased  concentration or agitation other than above  ;'    Objective:   Physical Exam BP 118/74 mmHg  Pulse 86  Temp(Src) 98.3 F (36.8 C) (Oral)  Ht 5\' 5"  (1.651 m)  Wt 188 lb (85.276 kg)  BMI 31.28 kg/m2  SpO2 96% VS noted,  Constitutional: Pt appears in no significant distress HENT: Head: NCAT.  Right Ear: External ear normal.  Left Ear: External ear normal.  Bilat tm's with mild erythema.  Max sinus areas  non tender.  Pharynx with mild erythema, no exudate Eyes: . Pupils are equal, round, and reactive to light. Conjunctivae and EOM are normal Neck: Normal range of motion. Neck supple.  Cardiovascular: Normal rate and regular rhythm.   Pulmonary/Chest: Effort normal and breath sounds without rales or wheezing.  Abd:  Soft, NT, ND, + BS Spine nontender Neurological: Pt is alert. Not confused , motor 5/5 intact Skin: Skin is warm. No rash, no LE edema Psychiatric: Pt behavior is normal. No agitation.     Assessment & Plan:

## 2015-03-24 NOTE — Patient Instructions (Addendum)
You had the flu shot today  Please take all new medication as prescribed - the zyrtec, triamcinolone cream, and the flexeril  You can also take Mucinex (or it's generic off brand) for congestion, and tylenol as needed for pain.  Please continue all other medications as before, including naproxen for the back pain as needed  Please have the pharmacy call with any other refills you may need.  Please continue your efforts at being more active, low cholesterol diet, and weight control.  Please keep your appointments with your specialists as you may have planned  Please go to the LAB in the Basement (turn left off the elevator) for the tests to be done today  You will be contacted by phone if any changes need to be made immediately.  Otherwise, you will receive a letter about your results with an explanation, but please check with MyChart first.  Please remember to sign up for MyChart if you have not done so, as this will be important to you in the future with finding out test results, communicating by private email, and scheduling acute appointments online when needed.  Please return in 6 months, or sooner if needed, with Lab testing done 3-5 days before  You are given the work note

## 2015-03-24 NOTE — Progress Notes (Signed)
Pre visit review using our clinic review tool, if applicable. No additional management support is needed unless otherwise documented below in the visit note. 

## 2015-03-25 NOTE — Assessment & Plan Note (Signed)
stable overall by history and exam, recent data reviewed with pt, and pt to continue medical treatment as before,  to f/u any worsening symptoms or concerns BP Readings from Last 3 Encounters:  03/24/15 118/74  12/16/14 120/82  09/09/14 122/80

## 2015-03-25 NOTE — Assessment & Plan Note (Signed)
Mild to mod, for flexeril prn,no neuro changes, stable overall,  to f/u any worsening symptoms or concerns

## 2015-03-25 NOTE — Assessment & Plan Note (Signed)
Also for mucinex otc prn, Mild to mod,  to f/u any worsening symptoms or concerns

## 2015-03-25 NOTE — Assessment & Plan Note (Signed)
stable overall by history and exam, recent data reviewed with pt, and pt to continue medical treatment as before,  to f/u any worsening symptoms or concerns Lab Results  Component Value Date   HGBA1C 7.0* 08/26/2014    

## 2015-03-25 NOTE — Assessment & Plan Note (Signed)
Mild to mod, for topical steroid tx,  to f/u any worsening symptoms or concerns °

## 2015-03-25 NOTE — Assessment & Plan Note (Addendum)
Mild to mod, for flonase/zyrtec start,   to f/u any worsening symptoms or concerns  Note:  Total time for pt hx, exam, review of record with pt in the room, determination of diagnoses and plan for further eval and tx is > 40 min, with over 50% spent in coordination and counseling of patient

## 2015-03-25 NOTE — Assessment & Plan Note (Signed)
stable overall by history and exam, recent data reviewed with pt, and pt to continue medical treatment as before,  to f/u any worsening symptoms or concerns Lab Results  Component Value Date   LDLCALC 101* 09/09/2014

## 2015-04-21 ENCOUNTER — Encounter: Payer: Self-pay | Admitting: Endocrinology

## 2015-04-21 ENCOUNTER — Ambulatory Visit (INDEPENDENT_AMBULATORY_CARE_PROVIDER_SITE_OTHER): Payer: Managed Care, Other (non HMO) | Admitting: Endocrinology

## 2015-04-21 VITALS — BP 132/80 | HR 85 | Ht 65.0 in | Wt 187.0 lb

## 2015-04-21 DIAGNOSIS — E042 Nontoxic multinodular goiter: Secondary | ICD-10-CM

## 2015-04-21 LAB — TSH: TSH: 2.05 u[IU]/mL (ref 0.35–4.50)

## 2015-04-21 NOTE — Progress Notes (Signed)
Subjective:    Patient ID: Erica Mills, female    DOB: 1955-11-24, 59 y.o.   MRN: [id number]  HPI Pt returns for f/u of hyperthyroidism due to multinodular goiter (dx'ed 2010; scan in 2010 showed low uptake; abnormal tsh resolved in 2011, but it recurred in 2013; she had nuc med scan (showed normal uptake, and multinodular pattern), in preparation for i-131; US showed 5 cm right lobe mass; however, she not get i-131, as she did not have insurance; she eventually had 2 doses of i-131 rx: in august of 2014 and sept of 2015).  pt states she feels well in general.  She does not notice the goiter.  Past Medical History  Diagnosis Date  . ACHILLES TENDINITIS 10/06/2009  . ALLERGIC RHINITIS 05/19/2007  . CARPAL TUNNEL SYNDROME, BILATERAL 05/19/2007  . ELBOW PAIN, RIGHT 04/21/2008  . GLUCOSE INTOLERANCE 02/14/2009  . HYPERLIPIDEMIA 05/19/2007  . HYPERTENSION 05/19/2007  . HYPERTHYROIDISM 03/10/2009  . Lateral epicondylitis  of elbow 04/21/2008  . SINUSITIS- ACUTE-NOS 05/11/2010  . SMOKER 05/19/2007  . Fibroids   . H/O menorrhagia   . Cyst of breast, right, solitary 07/2012    Past Surgical History  Procedure Laterality Date  . Tonsillectomy    . Cesarean section      Social History   Social History  . Marital Status: Married    Spouse Name: N/A  . Number of Children: N/A  . Years of Education: N/A   Occupational History  . Not on file.   Social History Main Topics  . Smoking status: Former Research scientist (life sciences)  . Smokeless tobacco: Never Used     Comment: QUIT SMOKING X 4 WEEKS  . Alcohol Use: No  . Drug Use: No  . Sexual Activity: Yes    Birth Control/ Protection: Surgical, Post-menopausal     Comment: BTL   Other Topics Concern  . Not on file   Social History Narrative    Current Outpatient Prescriptions on File Prior to Visit  Medication Sig Dispense Refill  . amLODipine (NORVASC) 10 MG tablet TAKE 1 TABLET (10 MG TOTAL) BY MOUTH DAILY. 90 tablet 3  . aspirin 81 MG tablet Take 81 mg  by mouth daily.      Marland Kitchen atorvastatin (LIPITOR) 40 MG tablet Take 1 tablet (40 mg total) by mouth daily. 90 tablet 3  . cetirizine (ZYRTEC) 10 MG tablet Take 1 tablet (10 mg total) by mouth daily. 30 tablet 11  . cyclobenzaprine (FLEXERIL) 5 MG tablet Take 1 tablet (5 mg total) by mouth 3 (three) times daily as needed for muscle spasms. 30 tablet 2  . irbesartan (AVAPRO) 150 MG tablet TAKE 1 TABLET (150 MG TOTAL) BY MOUTH DAILY. 90 tablet 2  . metFORMIN (GLUCOPHAGE XR) 500 MG 24 hr tablet Take 1 tablet (500 mg total) by mouth daily with breakfast. 90 tablet 3  . naproxen (NAPROSYN) 500 MG tablet TAKE 1 TABLET (500 MG TOTAL) BY MOUTH 2 (TWO) TIMES DAILY. 180 tablet 3  . sertraline (ZOLOFT) 50 MG tablet TAKE 1 TABLET (50 MG TOTAL) BY MOUTH DAILY. 90 tablet 1  . triamcinolone cream (KENALOG) 0.1 % Apply 1 application topically 4 (four) times daily. As needed for rash 30 g 0  . fluticasone (FLONASE) 50 MCG/ACT nasal spray Place 2 sprays into the nose daily. 16 g 5   No current facility-administered medications on file prior to visit.    Allergies  Allergen Reactions  . Sulfonamide Derivatives     REACTION: rash  Family History  Problem Relation Age of Onset  . Hypertension Mother   . Diabetes Mother   . Diabetes Father   . Hypertension Father   . Cancer Paternal Uncle     colon cancer  . Diabetes Sister     BP 132/80 mmHg  Pulse 85  Ht 5\' 5"  (1.651 m)  Wt 187 lb (84.823 kg)  BMI 31.12 kg/m2  SpO2 95%  Review of Systems No weight change    Objective:   Physical Exam VITAL SIGNS:  See vs page GENERAL: no distress NECK: thyroid is slightly enlarged, with multinodular surface.    Lab Results  Component Value Date   TSH 2.05 04/21/2015      Assessment & Plan:  Multinodular goiter: euthyroid after I-131 rx.  Patient is advised the following: Patient Instructions  A thyroid blood test is requested for you today.  We'll let you know about the results. Please come back  for a follow-up appointment in 8 months.   most of the time, a "lumpy thyroid" will eventually become overactive again.  this is usually a slow process, happening over the span of many years.

## 2015-04-21 NOTE — Patient Instructions (Addendum)
A thyroid blood test is requested for you today.  We'll let you know about the results. Please come back for a follow-up appointment in 8 months.   most of the time, a "lumpy thyroid" will eventually become overactive again.  this is usually a slow process, happening over the span of many years.

## 2015-05-24 ENCOUNTER — Other Ambulatory Visit: Payer: Self-pay | Admitting: Internal Medicine

## 2015-08-03 ENCOUNTER — Other Ambulatory Visit: Payer: Self-pay | Admitting: Internal Medicine

## 2015-08-03 NOTE — Telephone Encounter (Signed)
Done hardcopy to Corinne  

## 2015-08-03 NOTE — Telephone Encounter (Signed)
Please advise, patient is needing refill on zoloft

## 2015-09-14 ENCOUNTER — Other Ambulatory Visit: Payer: Self-pay | Admitting: Internal Medicine

## 2015-09-22 ENCOUNTER — Ambulatory Visit: Payer: Managed Care, Other (non HMO) | Admitting: Internal Medicine

## 2015-10-03 ENCOUNTER — Other Ambulatory Visit: Payer: Self-pay | Admitting: Internal Medicine

## 2015-10-13 ENCOUNTER — Ambulatory Visit: Payer: Managed Care, Other (non HMO) | Admitting: Internal Medicine

## 2015-10-20 ENCOUNTER — Other Ambulatory Visit (INDEPENDENT_AMBULATORY_CARE_PROVIDER_SITE_OTHER): Payer: Managed Care, Other (non HMO)

## 2015-10-20 DIAGNOSIS — E119 Type 2 diabetes mellitus without complications: Secondary | ICD-10-CM

## 2015-10-20 DIAGNOSIS — Z0189 Encounter for other specified special examinations: Secondary | ICD-10-CM

## 2015-10-20 DIAGNOSIS — Z0000 Encounter for general adult medical examination without abnormal findings: Secondary | ICD-10-CM

## 2015-10-20 LAB — HEPATIC FUNCTION PANEL
ALBUMIN: 4.3 g/dL (ref 3.5–5.2)
ALT: 26 U/L (ref 0–35)
AST: 23 U/L (ref 0–37)
Alkaline Phosphatase: 66 U/L (ref 39–117)
BILIRUBIN TOTAL: 0.4 mg/dL (ref 0.2–1.2)
Bilirubin, Direct: 0.1 mg/dL (ref 0.0–0.3)
TOTAL PROTEIN: 7.3 g/dL (ref 6.0–8.3)

## 2015-10-20 LAB — MICROALBUMIN / CREATININE URINE RATIO
CREATININE, U: 178.5 mg/dL
MICROALB/CREAT RATIO: 0.6 mg/g (ref 0.0–30.0)
Microalb, Ur: 1 mg/dL (ref 0.0–1.9)

## 2015-10-20 LAB — BASIC METABOLIC PANEL
BUN: 16 mg/dL (ref 6–23)
CHLORIDE: 107 meq/L (ref 96–112)
CO2: 28 mEq/L (ref 19–32)
CREATININE: 0.75 mg/dL (ref 0.40–1.20)
Calcium: 9.7 mg/dL (ref 8.4–10.5)
GFR: 101.28 mL/min (ref >60.00)
Glucose, Bld: 78 mg/dL (ref 70–99)
Potassium: 4 mEq/L (ref 3.5–5.1)
Sodium: 142 mEq/L (ref 135–145)

## 2015-10-20 LAB — CBC WITH DIFFERENTIAL/PLATELET
BASOS ABS: 0.1 10*3/uL (ref 0.0–0.1)
BASOS PCT: 0.6 % (ref 0.0–3.0)
EOS ABS: 0.1 10*3/uL (ref 0.0–0.7)
Eosinophils Relative: 1.6 % (ref 0.0–5.0)
HEMATOCRIT: 38.8 % (ref 36.0–46.0)
HEMOGLOBIN: 12.7 g/dL (ref 12.0–15.0)
LYMPHS PCT: 50.2 % — AB (ref 12.0–46.0)
Lymphs Abs: 4.6 10*3/uL — ABNORMAL HIGH (ref 0.7–4.0)
MCHC: 32.8 g/dL (ref 30.0–36.0)
MCV: 86.2 fl (ref 78.0–100.0)
MONO ABS: 0.7 10*3/uL (ref 0.1–1.0)
Monocytes Relative: 8.2 % (ref 3.0–12.0)
Neutro Abs: 3.6 10*3/uL (ref 1.4–7.7)
Neutrophils Relative %: 39.4 % — ABNORMAL LOW (ref 43.0–77.0)
Platelets: 242 10*3/uL (ref 150.0–400.0)
RBC: 4.49 Mil/uL (ref 3.87–5.11)
RDW: 18 % — AB (ref 11.5–15.5)
WBC: 9.1 10*3/uL (ref 4.0–10.5)

## 2015-10-20 LAB — URINALYSIS, ROUTINE W REFLEX MICROSCOPIC
BILIRUBIN URINE: NEGATIVE
Hgb urine dipstick: NEGATIVE
KETONES UR: NEGATIVE
LEUKOCYTES UA: NEGATIVE
Nitrite: NEGATIVE
PH: 5 (ref 5.0–8.0)
SPECIFIC GRAVITY, URINE: 1.025 (ref 1.000–1.030)
TOTAL PROTEIN, URINE-UPE24: NEGATIVE
UROBILINOGEN UA: 0.2 (ref 0.0–1.0)
Urine Glucose: NEGATIVE

## 2015-10-20 LAB — TSH: TSH: 2.12 u[IU]/mL (ref 0.35–4.50)

## 2015-10-20 LAB — HEMOGLOBIN A1C: Hgb A1c MFr Bld: 6.4 % (ref 4.6–6.5)

## 2015-10-20 LAB — LIPID PANEL
CHOL/HDL RATIO: 3
Cholesterol: 173 mg/dL (ref 0–200)
HDL: 52.4 mg/dL (ref >39.00)
LDL Cholesterol: 103 mg/dL — ABNORMAL HIGH (ref 0–99)
NONHDL: 120.97
Triglycerides: 91 mg/dL (ref 0.0–149.0)
VLDL: 18.2 mg/dL (ref 0.0–40.0)

## 2015-11-03 ENCOUNTER — Other Ambulatory Visit (INDEPENDENT_AMBULATORY_CARE_PROVIDER_SITE_OTHER): Payer: Managed Care, Other (non HMO)

## 2015-11-03 ENCOUNTER — Encounter: Payer: Self-pay | Admitting: Internal Medicine

## 2015-11-03 ENCOUNTER — Ambulatory Visit (INDEPENDENT_AMBULATORY_CARE_PROVIDER_SITE_OTHER): Payer: Managed Care, Other (non HMO) | Admitting: Internal Medicine

## 2015-11-03 VITALS — BP 124/78 | HR 87 | Temp 97.9°F | Resp 20 | Wt 181.0 lb

## 2015-11-03 DIAGNOSIS — I10 Essential (primary) hypertension: Secondary | ICD-10-CM | POA: Diagnosis not present

## 2015-11-03 DIAGNOSIS — Z0001 Encounter for general adult medical examination with abnormal findings: Secondary | ICD-10-CM

## 2015-11-03 DIAGNOSIS — E119 Type 2 diabetes mellitus without complications: Secondary | ICD-10-CM | POA: Diagnosis not present

## 2015-11-03 DIAGNOSIS — R6889 Other general symptoms and signs: Secondary | ICD-10-CM

## 2015-11-03 DIAGNOSIS — R05 Cough: Secondary | ICD-10-CM

## 2015-11-03 DIAGNOSIS — E785 Hyperlipidemia, unspecified: Secondary | ICD-10-CM

## 2015-11-03 DIAGNOSIS — R059 Cough, unspecified: Secondary | ICD-10-CM

## 2015-11-03 DIAGNOSIS — Z1159 Encounter for screening for other viral diseases: Secondary | ICD-10-CM

## 2015-11-03 DIAGNOSIS — L299 Pruritus, unspecified: Secondary | ICD-10-CM | POA: Insufficient documentation

## 2015-11-03 LAB — LIPID PANEL
CHOL/HDL RATIO: 3
Cholesterol: 172 mg/dL (ref 0–200)
HDL: 50.5 mg/dL (ref >39.00)
LDL Cholesterol: 107 mg/dL — ABNORMAL HIGH (ref 0–99)
NonHDL: 121.84
TRIGLYCERIDES: 72 mg/dL (ref 0.0–149.0)
VLDL: 14.4 mg/dL (ref 0.0–40.0)

## 2015-11-03 LAB — HEPATIC FUNCTION PANEL
ALK PHOS: 66 U/L (ref 39–117)
ALT: 21 U/L (ref 0–35)
AST: 17 U/L (ref 0–37)
Albumin: 4.3 g/dL (ref 3.5–5.2)
BILIRUBIN DIRECT: 0.1 mg/dL (ref 0.0–0.3)
TOTAL PROTEIN: 7.3 g/dL (ref 6.0–8.3)
Total Bilirubin: 0.4 mg/dL (ref 0.2–1.2)

## 2015-11-03 LAB — BASIC METABOLIC PANEL
BUN: 13 mg/dL (ref 6–23)
CALCIUM: 9.7 mg/dL (ref 8.4–10.5)
CO2: 28 meq/L (ref 19–32)
CREATININE: 0.79 mg/dL (ref 0.40–1.20)
Chloride: 108 mEq/L (ref 96–112)
GFR: 95.37 mL/min (ref >60.00)
Glucose, Bld: 87 mg/dL (ref 70–99)
Potassium: 3.9 mEq/L (ref 3.5–5.1)
SODIUM: 141 meq/L (ref 135–145)

## 2015-11-03 LAB — HEMOGLOBIN A1C: HEMOGLOBIN A1C: 6.6 % — AB (ref 4.6–6.5)

## 2015-11-03 MED ORDER — AZITHROMYCIN 250 MG PO TABS: ORAL_TABLET | ORAL | Status: DC

## 2015-11-03 MED ORDER — HYDROCODONE-HOMATROPINE 5-1.5 MG/5ML PO SYRP: 5.0000 mL | ORAL_SOLUTION | Freq: Four times a day (QID) | ORAL | Status: DC | PRN

## 2015-11-03 MED ORDER — NAPROXEN 500 MG PO TABS: ORAL_TABLET | ORAL | Status: DC

## 2015-11-03 NOTE — Progress Notes (Signed)
Pre visit review using our clinic review tool, if applicable. No additional management support is needed unless otherwise documented below in the visit note. 

## 2015-11-03 NOTE — Assessment & Plan Note (Signed)
stable overall by history and exam, recent data reviewed with pt, and pt to continue medical treatment as before,  to f/u any worsening symptoms or concerns Lab Results  Component Value Date   LDLCALC 103* 10/20/2015   Declines increased lipitor for now, goal ldl < 70, to cont diet

## 2015-11-03 NOTE — Assessment & Plan Note (Signed)
.  Mild to mod, c/w bronchitis vs pna, for antibx course, cough med prn, work note,and to f/u any worsening symptoms or concerns

## 2015-11-03 NOTE — Assessment & Plan Note (Signed)
stable overall by history and exam, recent data reviewed with pt, and pt to continue medical treatment as before,  to f/u any worsening symptoms or concerns BP Readings from Last 3 Encounters:  11/03/15 124/78  04/21/15 132/80  03/24/15 118/74

## 2015-11-03 NOTE — Assessment & Plan Note (Signed)

## 2015-11-03 NOTE — Patient Instructions (Addendum)
You had the Prevnar 13 pneumonia shot today  Please take all new medication as prescribed - the antibiotic, and cough medicine  You are given the work note today  Please continue all other medications as before, and refills have been done if requested.  Please have the pharmacy call with any other refills you may need.  Please continue your efforts at being more active, low cholesterol diet, and weight control.  You are otherwise up to date with prevention measures today.  Please make an appt with Dr Tamala Julian [facility] in this office if your shoulder pain is worse  Please keep your appointments with your specialists as you may have planned  Please go to the LAB in the Basement (turn left off the elevator) for the tests to be done today - just the Hep C test today  You will be contacted by phone if any changes need to be made immediately.  Otherwise, you will receive a letter about your results with an explanation, but please check with MyChart first.  Please remember to sign up for MyChart if you have not done so, as this will be important to you in the future with finding out test results, communicating by private email, and scheduling acute appointments online when needed.  Please return in 6 months, or sooner if needed, with Lab testing done 3-5 days before

## 2015-11-03 NOTE — Progress Notes (Signed)
Subjective:    Patient ID: Erica Mills, female    DOB: Sep 03, 1955, 60 y.o.   MRN: [id number]  HPI  Here for wellness and f/u;  Overall doing ok;  Pt denies Chest pain, worsening SOB, DOE, wheezing, orthopnea, PND, worsening LE edema, palpitations, dizziness or syncope.  Pt denies neurological change such as new headache, facial or extremity weakness.  Pt denies polydipsia, polyuria, or low sugar symptoms. Pt states overall good compliance with treatment and medications, good tolerability, and has been trying to follow appropriate diet.  Pt denies worsening depressive symptoms, suicidal ideation or panic. No fever, night sweats, wt loss, loss of appetite, or other constitutional symptoms.  Pt states good ability with ADL's, has low fall risk, home safety reviewed and adequate, no other significant changes in hearing or vision, and occasionally active with exercise. Lost 7 lbs intentionally with better diet. Wt Readings from Last 3 Encounters:  11/03/15 181 lb (82.101 kg)  04/21/15 187 lb (84.823 kg)  03/24/15 188 lb (85.276 kg)  Incidentally, Here with acute onset mild to mod 2-3 wks ST, HA, general weakness and malaise, with prod cough greenish sputum, but Pt denies chest pain, increased sob or doe, wheezing, orthopnea, PND, increased LE swelling, palpitations, dizziness or syncope. Asks for 2 days off work.  Also has right shoulder pain, mild to mod, worse to abduct, better with naproxen at home. Has not yet seen ortho or sport med.  Past Medical History  Diagnosis Date  . ACHILLES TENDINITIS 10/06/2009  . ALLERGIC RHINITIS 05/19/2007  . CARPAL TUNNEL SYNDROME, BILATERAL 05/19/2007  . ELBOW PAIN, RIGHT 04/21/2008  . GLUCOSE INTOLERANCE 02/14/2009  . HYPERLIPIDEMIA 05/19/2007  . HYPERTENSION 05/19/2007  . HYPERTHYROIDISM 03/10/2009  . Lateral epicondylitis  of elbow 04/21/2008  . SINUSITIS- ACUTE-NOS 05/11/2010  . SMOKER 05/19/2007  . Fibroids   . H/O menorrhagia   . Cyst of breast, right,  solitary 07/2012   Past Surgical History  Procedure Laterality Date  . Tonsillectomy    . Cesarean section      reports that she has quit smoking. She has never used smokeless tobacco. She reports that she does not drink alcohol or use illicit drugs. family history includes Cancer in her paternal uncle; Diabetes in her father, mother, and sister; Hypertension in her father and mother. Allergies  Allergen Reactions  . Sulfonamide Derivatives     REACTION: rash   Current Outpatient Prescriptions on File Prior to Visit  Medication Sig Dispense Refill  . amLODipine (NORVASC) 10 MG tablet TAKE 1 TABLET (10 MG TOTAL) BY MOUTH DAILY. 90 tablet 3  . aspirin 81 MG tablet Take 81 mg by mouth daily.      Marland Kitchen atorvastatin (LIPITOR) 40 MG tablet TAKE 1 TABLET (40 MG TOTAL) BY MOUTH DAILY. 90 tablet 2  . cetirizine (ZYRTEC) 10 MG tablet Take 1 tablet (10 mg total) by mouth daily. 30 tablet 11  . cyclobenzaprine (FLEXERIL) 5 MG tablet Take 1 tablet (5 mg total) by mouth 3 (three) times daily as needed for muscle spasms. 30 tablet 2  . irbesartan (AVAPRO) 150 MG tablet TAKE 1 TABLET BY MOUTH EVERY DAY 90 tablet 2  . metFORMIN (GLUCOPHAGE-XR) 500 MG 24 hr tablet TAKE 1 TABLET (500 MG TOTAL) BY MOUTH DAILY WITH BREAKFAST. 90 tablet 3  . naproxen (NAPROSYN) 500 MG tablet TAKE 1 TABLET (500 MG TOTAL) BY MOUTH 2 (TWO) TIMES DAILY. 180 tablet 3  . sertraline (ZOLOFT) 50 MG tablet TAKE 1 TABLET (50  MG TOTAL) BY MOUTH DAILY. 90 tablet 2  . triamcinolone cream (KENALOG) 0.1 % Apply 1 application topically 4 (four) times daily. As needed for rash 30 g 0  . fluticasone (FLONASE) 50 MCG/ACT nasal spray Place 2 sprays into the nose daily. 16 g 5   No current facility-administered medications on file prior to visit.     Review of Systems Constitutional: Negative for increased diaphoresis, or other activity, appetite or siginficant weight change other than noted HENT: Negative for worsening hearing loss, ear pain,  facial swelling, mouth sores and neck stiffness.   Eyes: Negative for other worsening pain, redness or visual disturbance.  Respiratory: Negative for choking or stridor Cardiovascular: Negative for other chest pain and palpitations.  Gastrointestinal: Negative for worsening diarrhea, blood in stool, or abdominal distention Genitourinary: Negative for hematuria, flank pain or change in urine volume.  Musculoskeletal: Negative for myalgias or other joint complaints.  Skin: Negative for other color change and wound or drainage.  Neurological: Negative for syncope and numbness. other than noted Hematological: Negative for adenopathy. or other swelling Psychiatric/Behavioral: Negative for hallucinations, SI, self-injury, decreased concentration or other worsening agitation.      Objective:   Physical Exam BP 124/78 mmHg  Pulse 87  Temp(Src) 97.9 F (36.6 C) (Oral)  Resp 20  Wt 181 lb (82.101 kg)  SpO2 97% VS noted, mild ill Constitutional: Pt is oriented to person, place, and time. Appears well-developed and well-nourished, in no significant distress Head: Normocephalic and atraumatic  Eyes: Conjunctivae and EOM are normal. Pupils are equal, round, and reactive to light Right Ear: External ear normal.  Left Ear: External ear normal Nose: Nose normal.  Mouth/Throat: Oropharynx is clear and moist  Bilat tm's with mild erythema.  Max sinus areas non tender.  Pharynx with mild erythema, no exudate Neck: Normal range of motion. Neck supple. No JVD present. No tracheal deviation present or significant neck LA or mass Cardiovascular: Normal rate, regular rhythm, normal heart sounds and intact distal pulses.   Pulmonary/Chest: Effort normal and breath sounds without rales or wheezing  Abdominal: Soft. Bowel sounds are normal. NT. No HSM  Musculoskeletal: Normal range of motion. Exhibits no edema Lymphadenopathy: Has no cervical adenopathy.  Neurological: Pt is alert and oriented to person,  place, and time. Pt has normal reflexes. No cranial nerve deficit. Motor grossly intact Skin: Skin is warm and dry. No rash noted or new ulcers Psychiatric:  Has normal mood and affect. Behavior is normal.  Right shoulder mild tender, FROM, worse to abduct and forward elevated    Assessment & Plan:

## 2015-11-03 NOTE — Assessment & Plan Note (Signed)
stable overall by history and exam, recent data reviewed with pt, and pt to continue medical treatment as before,  to f/u any worsening symptoms or concerns Lab Results  Component Value Date   HGBA1C 6.4 10/20/2015

## 2015-11-04 LAB — HEPATITIS C ANTIBODY: HCV Ab: NEGATIVE

## 2015-11-29 ENCOUNTER — Other Ambulatory Visit: Payer: Self-pay | Admitting: Internal Medicine

## 2015-12-07 ENCOUNTER — Other Ambulatory Visit: Payer: Self-pay | Admitting: Obstetrics and Gynecology

## 2015-12-07 DIAGNOSIS — R928 Other abnormal and inconclusive findings on diagnostic imaging of breast: Secondary | ICD-10-CM

## 2015-12-15 ENCOUNTER — Ambulatory Visit
Admission: RE | Admit: 2015-12-15 | Discharge: 2015-12-15 | Disposition: A | Discharge status: Home / Self Care | Payer: Managed Care, Other (non HMO) | Source: Ambulatory Visit | Attending: Obstetrics and Gynecology | Admitting: Obstetrics and Gynecology

## 2015-12-15 DIAGNOSIS — R928 Other abnormal and inconclusive findings on diagnostic imaging of breast: Secondary | ICD-10-CM

## 2015-12-22 ENCOUNTER — Ambulatory Visit (INDEPENDENT_AMBULATORY_CARE_PROVIDER_SITE_OTHER): Payer: Managed Care, Other (non HMO) | Admitting: Endocrinology

## 2015-12-22 ENCOUNTER — Encounter: Payer: Self-pay | Admitting: Endocrinology

## 2015-12-22 VITALS — BP 122/80 | HR 89 | Ht 65.0 in | Wt 182.0 lb

## 2015-12-22 DIAGNOSIS — E042 Nontoxic multinodular goiter: Secondary | ICD-10-CM

## 2015-12-22 DIAGNOSIS — N6002 Solitary cyst of left breast: Secondary | ICD-10-CM | POA: Insufficient documentation

## 2015-12-22 NOTE — Progress Notes (Signed)
Subjective:    Patient ID: Erica Mills, female    DOB: [birth year], 60 y.o.   MRN: [id number]  HPI Pt returns for f/u of hyperthyroidism due to multinodular goiter (dx'ed 2010; scan in 2010 showed low uptake; abnormal tsh resolved in 2011, but it recurred in 2013; she had nuc med scan (showed normal uptake, and multinodular pattern), in preparation for i-131; US showed 5 cm right lobe mass; however, she not get i-131, as she did not have insurance; she eventually had 2 doses of i-131 rx: in august of 2014 and sept of 2015).  pt states she feels well in general.  She does not notice the goiter.  Past Medical History  Diagnosis Date  . ACHILLES TENDINITIS 10/06/2009  . ALLERGIC RHINITIS 05/19/2007  . CARPAL TUNNEL SYNDROME, BILATERAL 05/19/2007  . ELBOW PAIN, RIGHT 04/21/2008  . GLUCOSE INTOLERANCE 02/14/2009  . HYPERLIPIDEMIA 05/19/2007  . HYPERTENSION 05/19/2007  . HYPERTHYROIDISM 03/10/2009  . Lateral epicondylitis  of elbow 04/21/2008  . SINUSITIS- ACUTE-NOS 05/11/2010  . SMOKER 05/19/2007  . Fibroids   . H/O menorrhagia   . Cyst of breast, right, solitary 07/2012    Past Surgical History  Procedure Laterality Date  . Tonsillectomy    . Cesarean section      Social History   Social History  . Marital Status: Married    Spouse Name: N/A  . Number of Children: N/A  . Years of Education: N/A   Occupational History  . Not on file.   Social History Main Topics  . Smoking status: Former Research scientist (life sciences)  . Smokeless tobacco: Never Used     Comment: QUIT SMOKING X 4 WEEKS  . Alcohol Use: No  . Drug Use: No  . Sexual Activity: Yes    Birth Control/ Protection: Surgical, Post-menopausal     Comment: BTL   Other Topics Concern  . Not on file   Social History Narrative    Current Outpatient Prescriptions on File Prior to Visit  Medication Sig Dispense Refill  . amLODipine (NORVASC) 10 MG tablet TAKE 1 TABLET (10 MG TOTAL) BY MOUTH DAILY. 90 tablet 3  . aspirin 81 MG tablet Take 81 mg  by mouth daily.      Marland Kitchen atorvastatin (LIPITOR) 40 MG tablet TAKE 1 TABLET (40 MG TOTAL) BY MOUTH DAILY. 90 tablet 2  . azithromycin (ZITHROMAX Z-PAK) 250 MG tablet 2 tab by mouth for day 1, then 1 tab by mouth daily 6 tablet 1  . cetirizine (ZYRTEC) 10 MG tablet Take 1 tablet (10 mg total) by mouth daily. 30 tablet 11  . cyclobenzaprine (FLEXERIL) 5 MG tablet Take 1 tablet (5 mg total) by mouth 3 (three) times daily as needed for muscle spasms. 30 tablet 2  . HYDROcodone-homatropine (HYCODAN) 5-1.5 MG/5ML syrup Take 5 mLs by mouth every 6 (six) hours as needed for cough. 180 mL 0  . irbesartan (AVAPRO) 150 MG tablet TAKE 1 TABLET BY MOUTH EVERY DAY 90 tablet 2  . metFORMIN (GLUCOPHAGE-XR) 500 MG 24 hr tablet TAKE 1 TABLET (500 MG TOTAL) BY MOUTH DAILY WITH BREAKFAST. 90 tablet 3  . naproxen (NAPROSYN) 500 MG tablet TAKE 1 TABLET (500 MG TOTAL) BY MOUTH 2 (TWO) TIMES DAILY. 180 tablet 3  . sertraline (ZOLOFT) 50 MG tablet TAKE 1 TABLET (50 MG TOTAL) BY MOUTH DAILY. 90 tablet 2  . triamcinolone cream (KENALOG) 0.1 % Apply 1 application topically 4 (four) times daily. As needed for rash 30 g 0  .  fluticasone (FLONASE) 50 MCG/ACT nasal spray Place 2 sprays into the nose daily. 16 g 5   No current facility-administered medications on file prior to visit.    Allergies  Allergen Reactions  . Sulfonamide Derivatives     REACTION: rash    Family History  Problem Relation Age of Onset  . Hypertension Mother   . Diabetes Mother   . Diabetes Father   . Hypertension Father   . Cancer Paternal Uncle     colon cancer  . Diabetes Sister     BP 122/80 mmHg  Pulse 89  Ht 5\' 5"  (1.651 m)  Wt 182 lb (82.555 kg)  BMI 30.29 kg/m2  SpO2 95%  Review of Systems Denies neck pain    Objective:   Physical Exam VITAL SIGNS:  See vs page GENERAL: no distress NECK: thyroid is slightly enlarged, with multinodular surface.    Lab Results  Component Value Date   TSH 2.12 10/20/2015        Assessment & Plan:  Hyperthyroidism, resolved with RAI, but she is at risk for recurrence  Patient is advised the following: Patient Instructions  No further thyroid treatment is needed now. Please come back for a follow-up appointment in 1 year.  most of the time, a "lumpy thyroid" will eventually become overactive again (underactive is also possible, but much less likely).     Renato Shin, MD

## 2015-12-22 NOTE — Patient Instructions (Addendum)
No further thyroid treatment is needed now. Please come back for a follow-up appointment in 1 year.  most of the time, a "lumpy thyroid" will eventually become overactive again (underactive is also possible, but much less likely).

## 2016-04-26 ENCOUNTER — Ambulatory Visit: Payer: Managed Care, Other (non HMO) | Admitting: Internal Medicine

## 2016-05-06 ENCOUNTER — Other Ambulatory Visit: Payer: Self-pay | Admitting: Internal Medicine

## 2016-05-06 NOTE — Telephone Encounter (Signed)
zoloft done erx

## 2016-05-17 ENCOUNTER — Ambulatory Visit (INDEPENDENT_AMBULATORY_CARE_PROVIDER_SITE_OTHER): Payer: Managed Care, Other (non HMO) | Admitting: Internal Medicine

## 2016-05-17 ENCOUNTER — Encounter: Payer: Self-pay | Admitting: Internal Medicine

## 2016-05-17 VITALS — BP 130/70 | HR 87 | Temp 98.0°F | Resp 20 | Wt 187.0 lb

## 2016-05-17 DIAGNOSIS — R05 Cough: Secondary | ICD-10-CM | POA: Diagnosis not present

## 2016-05-17 DIAGNOSIS — Z23 Encounter for immunization: Secondary | ICD-10-CM

## 2016-05-17 DIAGNOSIS — I10 Essential (primary) hypertension: Secondary | ICD-10-CM

## 2016-05-17 DIAGNOSIS — L299 Pruritus, unspecified: Secondary | ICD-10-CM

## 2016-05-17 DIAGNOSIS — E785 Hyperlipidemia, unspecified: Secondary | ICD-10-CM | POA: Diagnosis not present

## 2016-05-17 DIAGNOSIS — Z0001 Encounter for general adult medical examination with abnormal findings: Secondary | ICD-10-CM | POA: Diagnosis not present

## 2016-05-17 DIAGNOSIS — E119 Type 2 diabetes mellitus without complications: Secondary | ICD-10-CM

## 2016-05-17 DIAGNOSIS — R21 Rash and other nonspecific skin eruption: Secondary | ICD-10-CM

## 2016-05-17 DIAGNOSIS — R059 Cough, unspecified: Secondary | ICD-10-CM

## 2016-05-17 LAB — POCT GLYCOSYLATED HEMOGLOBIN (HGB A1C): Hemoglobin A1C: 5.9

## 2016-05-17 MED ORDER — PREDNISONE 10 MG PO TABS: ORAL_TABLET | ORAL | 0 refills | Status: DC

## 2016-05-17 MED ORDER — METHYLPREDNISOLONE ACETATE 80 MG/ML IJ SUSP
80.0000 mg | Freq: Once | INTRAMUSCULAR | Status: AC
  Administered 2016-05-17: 80 mg via INTRAMUSCULAR

## 2016-05-17 MED ORDER — ROSUVASTATIN CALCIUM 40 MG PO TABS: 40.0000 mg | ORAL_TABLET | Freq: Every day | ORAL | 3 refills | Status: DC

## 2016-05-17 NOTE — Progress Notes (Signed)
Pre visit review using our clinic review tool, if applicable. No additional management support is needed unless otherwise documented below in the visit note. 

## 2016-05-17 NOTE — Progress Notes (Signed)
Subjective:    Patient ID: EMRIE STIRN, female    DOB: [birth year], 60 y.o.   MRN: [id number]  HPI  Here to f/u; overall doing ok,  Pt denies chest pain, increasing sob or doe, wheezing, orthopnea, PND, increased LE swelling, palpitations, dizziness or syncope.  Pt denies new neurological symptoms such as new headache, or facial or extremity weakness or numbness.  Pt denies polydipsia, polyuria, or low sugar episode.   Pt denies new neurological symptoms such as new headache, or facial or extremity weakness or numbness.   Pt states overall good compliance with meds.  Has rather severe scalp itch to the crown in a 2-3 cm area with bumps, just cant stop scratching, no pain, drainage or other swelling Past Medical History:  Diagnosis Date  . ACHILLES TENDINITIS 10/06/2009  . ALLERGIC RHINITIS 05/19/2007  . CARPAL TUNNEL SYNDROME, BILATERAL 05/19/2007  . Cyst of breast, right, solitary 07/2012  . ELBOW PAIN, RIGHT 04/21/2008  . Fibroids   . GLUCOSE INTOLERANCE 02/14/2009  . H/O menorrhagia   . HYPERLIPIDEMIA 05/19/2007  . HYPERTENSION 05/19/2007  . HYPERTHYROIDISM 03/10/2009  . Lateral epicondylitis  of elbow 04/21/2008  . SINUSITIS- ACUTE-NOS 05/11/2010  . SMOKER 05/19/2007   Past Surgical History:  Procedure Laterality Date  . CESAREAN SECTION    . TONSILLECTOMY      reports that she has quit smoking. She has never used smokeless tobacco. She reports that she does not drink alcohol or use drugs. family history includes Cancer in her paternal uncle; Diabetes in her father, mother, and sister; Hypertension in her father and mother. Allergies  Allergen Reactions  . Sulfonamide Derivatives     REACTION: rash   Current Outpatient Prescriptions on File Prior to Visit  Medication Sig Dispense Refill  . amLODipine (NORVASC) 10 MG tablet TAKE 1 TABLET (10 MG TOTAL) BY MOUTH DAILY. 90 tablet 3  . aspirin 81 MG tablet Take 81 mg by mouth daily.      Marland Kitchen atorvastatin (LIPITOR) 40 MG tablet TAKE 1  TABLET (40 MG TOTAL) BY MOUTH DAILY. 90 tablet 2  . cetirizine (ZYRTEC) 10 MG tablet Take 1 tablet (10 mg total) by mouth daily. 30 tablet 11  . cyclobenzaprine (FLEXERIL) 5 MG tablet Take 1 tablet (5 mg total) by mouth 3 (three) times daily as needed for muscle spasms. 30 tablet 2  . HYDROcodone-homatropine (HYCODAN) 5-1.5 MG/5ML syrup Take 5 mLs by mouth every 6 (six) hours as needed for cough. 180 mL 0  . irbesartan (AVAPRO) 150 MG tablet TAKE 1 TABLET BY MOUTH EVERY DAY 90 tablet 2  . metFORMIN (GLUCOPHAGE-XR) 500 MG 24 hr tablet TAKE 1 TABLET (500 MG TOTAL) BY MOUTH DAILY WITH BREAKFAST. 90 tablet 3  . naproxen (NAPROSYN) 500 MG tablet TAKE 1 TABLET (500 MG TOTAL) BY MOUTH 2 (TWO) TIMES DAILY. 180 tablet 3  . sertraline (ZOLOFT) 50 MG tablet TAKE 1 TABLET (50 MG TOTAL) BY MOUTH DAILY. 90 tablet 2  . triamcinolone cream (KENALOG) 0.1 % Apply 1 application topically 4 (four) times daily. As needed for rash 30 g 0  . fluticasone (FLONASE) 50 MCG/ACT nasal spray Place 2 sprays into the nose daily. 16 g 5   No current facility-administered medications on file prior to visit.    Review of Systems  Constitutional: Negative for unusual diaphoresis or night sweats HENT: Negative for ear swelling or discharge Eyes: Negative for worsening visual haziness  Respiratory: Negative for choking and stridor.   Gastrointestinal: Negative  for distension or worsening eructation Genitourinary: Negative for retention or change in urine volume.  Musculoskeletal: Negative for other MSK pain or swelling Skin: Negative for color change and worsening wound Neurological: Negative for tremors and numbness other than noted  Psychiatric/Behavioral: Negative for decreased concentration or agitation other than above   All other system neg per pt    Objective:   Physical Exam BP 130/70   Pulse 87   Temp 98 F (36.7 C) (Oral)   Resp 20   Wt 187 lb (84.8 kg)   SpO2 96%   BMI 31.12 kg/m  VS noted,    Constitutional: Pt appears in no apparent distress HENT: Head: NCAT.  Right Ear: External ear normal.  Left Ear: External ear normal.  Eyes: . Pupils are equal, round, and reactive to light. Conjunctivae and EOM are normal Neck: Normal range of motion. Neck supple.  Cardiovascular: Normal rate and regular rhythm.   Pulmonary/Chest: Effort normal and breath sounds without rales or wheezing.  Abd:  Soft, NT, ND, + BS Neurological: Pt is alert. Not confused , motor grossly intact Skin: Skin is warm. No rash, no LE edema, scalp with 3 cm area tiny bumpiness to o/w intact scalp with mild erythema, slight hair loss in the area but not really alopecia areata Psychiatric: Pt behavior is normal. No agitation.   Hgba1c (POCT) - POCT HgB A1C    10:19  Hemoglobin A1C 5.9            Assessment & Plan:

## 2016-05-17 NOTE — Patient Instructions (Addendum)
You had the flu shot today  You had the steroid shot today; Please take all new medication as prescribed - the prednisone  Please return in 2 weeks for Nurse Visit for Prevnar 13 shot  Ok to change the lipitor 40 mg to crestor 40 mg (a new prescription is sent)  Please see Dr Nevada Crane if the scalp rash is not improved  Your Hgba1c is:  5.9  Please continue all other medications as before, and refills have been done if requested.  Please have the pharmacy call with any other refills you may need.  Please continue your efforts at being more active, low cholesterol diet, and weight control.  Please keep your appointments with your specialists as you may have planned  Please return in 6 months, or sooner if needed, with Lab testing done 3-5 days before

## 2016-05-18 NOTE — Assessment & Plan Note (Signed)
Uncontrolled, for change to crestor 40 qd,  to f/u any worsening symptoms or concerns

## 2016-05-18 NOTE — Assessment & Plan Note (Signed)
C/w scalp dermatitis, likely inflammatory, for depomedrol 80 IM, predpac asd,  to f/u any worsening symptoms or concerns

## 2016-05-18 NOTE — Assessment & Plan Note (Signed)
stable overall by history and exam, recent data reviewed with pt, and pt to continue medical treatment as before,  to f/u any worsening symptoms or concerns BP Readings from Last 3 Encounters:  05/17/16 130/70  12/22/15 122/80  11/03/15 124/78

## 2016-05-18 NOTE — Assessment & Plan Note (Signed)
stable overall by history and exam, recent data reviewed with pt, and pt to continue medical treatment as before,  to f/u any worsening symptoms or concerns Lab Results  Component Value Date   HGBA1C 5.9 05/17/2016    

## 2016-05-31 ENCOUNTER — Ambulatory Visit (INDEPENDENT_AMBULATORY_CARE_PROVIDER_SITE_OTHER): Payer: Managed Care, Other (non HMO)

## 2016-05-31 DIAGNOSIS — Z23 Encounter for immunization: Secondary | ICD-10-CM | POA: Diagnosis not present

## 2016-06-07 ENCOUNTER — Ambulatory Visit: Payer: Managed Care, Other (non HMO)

## 2016-09-23 ENCOUNTER — Other Ambulatory Visit: Payer: Self-pay | Admitting: Internal Medicine

## 2016-11-15 ENCOUNTER — Ambulatory Visit: Payer: Managed Care, Other (non HMO) | Admitting: Internal Medicine

## 2016-11-29 ENCOUNTER — Encounter: Payer: Self-pay | Admitting: Internal Medicine

## 2016-11-29 ENCOUNTER — Other Ambulatory Visit (INDEPENDENT_AMBULATORY_CARE_PROVIDER_SITE_OTHER): Payer: Managed Care, Other (non HMO)

## 2016-11-29 ENCOUNTER — Ambulatory Visit (INDEPENDENT_AMBULATORY_CARE_PROVIDER_SITE_OTHER): Payer: Managed Care, Other (non HMO) | Admitting: Internal Medicine

## 2016-11-29 VITALS — BP 136/86 | HR 91 | Ht 65.0 in | Wt 181.0 lb

## 2016-11-29 DIAGNOSIS — Z0000 Encounter for general adult medical examination without abnormal findings: Secondary | ICD-10-CM

## 2016-11-29 DIAGNOSIS — E119 Type 2 diabetes mellitus without complications: Secondary | ICD-10-CM | POA: Diagnosis not present

## 2016-11-29 DIAGNOSIS — M5416 Radiculopathy, lumbar region: Secondary | ICD-10-CM | POA: Insufficient documentation

## 2016-11-29 DIAGNOSIS — Z0001 Encounter for general adult medical examination with abnormal findings: Secondary | ICD-10-CM | POA: Diagnosis not present

## 2016-11-29 DIAGNOSIS — Z114 Encounter for screening for human immunodeficiency virus [HIV]: Secondary | ICD-10-CM

## 2016-11-29 LAB — URINALYSIS, ROUTINE W REFLEX MICROSCOPIC
Bilirubin Urine: NEGATIVE
KETONES UR: NEGATIVE
LEUKOCYTES UA: NEGATIVE
Nitrite: NEGATIVE
Specific Gravity, Urine: >=1.03 — AB (ref 1.000–1.030)
Total Protein, Urine: 100 — AB
URINE GLUCOSE: NEGATIVE
Urobilinogen, UA: 0.2 (ref 0.0–1.0)
pH: 6 (ref 5.0–8.0)

## 2016-11-29 LAB — CBC WITH DIFFERENTIAL/PLATELET
BASOS PCT: 0.5 % (ref 0.0–3.0)
Basophils Absolute: 0.1 10*3/uL (ref 0.0–0.1)
EOS PCT: 2.9 % (ref 0.0–5.0)
Eosinophils Absolute: 0.3 10*3/uL (ref 0.0–0.7)
HCT: 39.3 % (ref 36.0–46.0)
Hemoglobin: 12.9 g/dL (ref 12.0–15.0)
LYMPHS ABS: 3.9 10*3/uL (ref 0.7–4.0)
Lymphocytes Relative: 34.4 % (ref 12.0–46.0)
MCHC: 32.8 g/dL (ref 30.0–36.0)
MCV: 88 fl (ref 78.0–100.0)
MONOS PCT: 8.5 % (ref 3.0–12.0)
Monocytes Absolute: 1 10*3/uL (ref 0.1–1.0)
NEUTROS ABS: 6.1 10*3/uL (ref 1.4–7.7)
NEUTROS PCT: 53.7 % (ref 43.0–77.0)
Platelets: 229 10*3/uL (ref 150.0–400.0)
RBC: 4.47 Mil/uL (ref 3.87–5.11)
RDW: 15.8 % — AB (ref 11.5–15.5)
WBC: 11.3 10*3/uL — ABNORMAL HIGH (ref 4.0–10.5)

## 2016-11-29 LAB — BASIC METABOLIC PANEL
BUN: 19 mg/dL (ref 6–23)
CALCIUM: 9.9 mg/dL (ref 8.4–10.5)
CO2: 27 mEq/L (ref 19–32)
Chloride: 108 mEq/L (ref 96–112)
Creatinine, Ser: 1.23 mg/dL — ABNORMAL HIGH (ref 0.40–1.20)
GFR: 57.01 mL/min — AB (ref >60.00)
Glucose, Bld: 104 mg/dL — ABNORMAL HIGH (ref 70–99)
POTASSIUM: 4.2 meq/L (ref 3.5–5.1)
Sodium: 143 mEq/L (ref 135–145)

## 2016-11-29 LAB — TSH: TSH: 2.44 u[IU]/mL (ref 0.35–4.50)

## 2016-11-29 LAB — HEMOGLOBIN A1C: Hgb A1c MFr Bld: 6.7 % — ABNORMAL HIGH (ref 4.6–6.5)

## 2016-11-29 LAB — LIPID PANEL
Cholesterol: 137 mg/dL (ref 0–200)
HDL: 53.1 mg/dL (ref >39.00)
LDL CALC: 65 mg/dL (ref 0–99)
NONHDL: 83.93
Total CHOL/HDL Ratio: 3
Triglycerides: 96 mg/dL (ref 0.0–149.0)
VLDL: 19.2 mg/dL (ref 0.0–40.0)

## 2016-11-29 LAB — HEPATIC FUNCTION PANEL
ALBUMIN: 4.3 g/dL (ref 3.5–5.2)
ALK PHOS: 71 U/L (ref 39–117)
ALT: 23 U/L (ref 0–35)
AST: 22 U/L (ref 0–37)
BILIRUBIN DIRECT: 0.1 mg/dL (ref 0.0–0.3)
BILIRUBIN TOTAL: 0.4 mg/dL (ref 0.2–1.2)
Total Protein: 7.1 g/dL (ref 6.0–8.3)

## 2016-11-29 LAB — MICROALBUMIN / CREATININE URINE RATIO
CREATININE, U: 148.4 mg/dL
Microalb Creat Ratio: 20.8 mg/g (ref 0.0–30.0)
Microalb, Ur: 30.9 mg/dL — ABNORMAL HIGH (ref 0.0–1.9)

## 2016-11-29 MED ORDER — NAPROXEN 500 MG PO TABS: ORAL_TABLET | ORAL | 3 refills | Status: DC

## 2016-11-29 NOTE — Patient Instructions (Signed)
Please continue all other medications as before, and refills have been done if requested.  Please have the pharmacy call with any other refills you may need.  Please continue your efforts at being more active, low cholesterol diet, and weight control.  You are otherwise up to date with prevention measures today.  Please keep your appointments with your specialists as you may have planned  You will be contacted regarding the referral for: Dr Tamala Julian - sports medicine in this office (or you can make and appt as you leave at the desk)  Please go to the LAB in the Basement (turn left off the elevator) for the tests to be done today  You will be contacted by phone if any changes need to be made immediately.  Otherwise, you will receive a letter about your results with an explanation, but please check with MyChart first.  Please remember to sign up for MyChart if you have not done so, as this will be important to you in the future with finding out test results, communicating by private email, and scheduling acute appointments online when needed.  Please return in 6 months, or sooner if needed

## 2016-11-29 NOTE — Assessment & Plan Note (Signed)
Here for wellness and f/u;  Overall doing ok;  Pt denies Chest pain, worsening SOB, DOE, wheezing, orthopnea, PND, worsening LE edema, palpitations, dizziness or syncope.  Pt denies neurological change such as new headache, facial or extremity weakness.  Pt denies polydipsia, polyuria, or low sugar symptoms. Pt states overall good compliance with treatment and medications, good tolerability, and has been trying to follow appropriate diet.  Pt denies worsening depressive symptoms, suicidal ideation or panic. No fever, night sweats, wt loss, loss of appetite, or other constitutional symptoms.  Pt states good ability with ADL's, has low fall risk, home safety reviewed and adequate, no other significant changes in hearing or vision, and only occasionally active with exercise.   

## 2016-11-29 NOTE — Assessment & Plan Note (Signed)
stable overall by history and exam, recent data reviewed with pt, and pt to continue medical treatment as before,  to f/u any worsening symptoms or concerns Lab Results  Component Value Date   HGBA1C 5.9 05/17/2016

## 2016-11-29 NOTE — Progress Notes (Signed)
Subjective:    Patient ID: Erica Mills, female    DOB: [birth year], 61 y.o.   MRN: [id number]  HPI   Here for wellness and f/u;  Overall doing ok;  Pt denies Chest pain, worsening SOB, DOE, wheezing, orthopnea, PND, worsening LE edema, palpitations, dizziness or syncope.  Pt denies neurological change such as new headache, facial or extremity weakness.  Pt denies polydipsia, polyuria, or low sugar symptoms. Pt states overall good compliance with treatment and medications, good tolerability, and has been trying to follow appropriate diet.  Pt denies worsening depressive symptoms, suicidal ideation or panic. No fever, night sweats, wt loss, loss of appetite, or other constitutional symptoms.  Pt states good ability with ADL's, has low fall risk, home safety reviewed and adequate, no other significant changes in hearing or vision, and occasionally active with exercise, with walking occasionally.  BP Readings from Last 3 Encounters:  11/29/16 136/86  05/17/16 130/70  12/22/15 122/80   Wt Readings from Last 3 Encounters:  11/29/16 181 lb (82.1 kg)  05/17/16 187 lb (84.8 kg)  12/22/15 182 lb (82.6 kg)  Husband died 2024-03-12with lung cancer, with some grieving still, had a meltdown about 1 wk ago, but now has been less in denial this wk and more accepting.  Declines counseling referral.  Pt palns to see eye doctor soon, now that is past her husband illness. Next pap smear due next wk jun 29.   Also with right LBP and right leg problem with pain starting at right lower with radaiton to the RLE with occas numbness but no weakness, falls or gait change Past Medical History:  Diagnosis Date  . ACHILLES TENDINITIS 10/06/2009  . ALLERGIC RHINITIS 05/19/2007  . CARPAL TUNNEL SYNDROME, BILATERAL 05/19/2007  . Cyst of breast, right, solitary 07/2012  . ELBOW PAIN, RIGHT 04/21/2008  . Fibroids   . GLUCOSE INTOLERANCE 02/14/2009  . H/O menorrhagia   . HYPERLIPIDEMIA 05/19/2007  . HYPERTENSION 05/19/2007  .  HYPERTHYROIDISM 03/10/2009  . Lateral epicondylitis  of elbow 04/21/2008  . SINUSITIS- ACUTE-NOS 05/11/2010  . SMOKER 05/19/2007   Past Surgical History:  Procedure Laterality Date  . CESAREAN SECTION    . TONSILLECTOMY      reports that she has quit smoking. She has never used smokeless tobacco. She reports that she does not drink alcohol or use drugs. family history includes Cancer in her paternal uncle; Diabetes in her father, mother, and sister; Hypertension in her father and mother. Allergies  Allergen Reactions  . Sulfonamide Derivatives     REACTION: rash   Current Outpatient Prescriptions on File Prior to Visit  Medication Sig Dispense Refill  . amLODipine (NORVASC) 10 MG tablet TAKE 1 TABLET (10 MG TOTAL) BY MOUTH DAILY. 90 tablet 3  . aspirin 81 MG tablet Take 81 mg by mouth daily.      Marland Kitchen atorvastatin (LIPITOR) 40 MG tablet TAKE 1 TABLET (40 MG TOTAL) BY MOUTH DAILY. 90 tablet 2  . cetirizine (ZYRTEC) 10 MG tablet Take 1 tablet (10 mg total) by mouth daily. 30 tablet 11  . cyclobenzaprine (FLEXERIL) 5 MG tablet Take 1 tablet (5 mg total) by mouth 3 (three) times daily as needed for muscle spasms. 30 tablet 2  . irbesartan (AVAPRO) 150 MG tablet TAKE 1 TABLET BY MOUTH EVERY DAY 90 tablet 2  . metFORMIN (GLUCOPHAGE-XR) 500 MG 24 hr tablet TAKE 1 TABLET (500 MG TOTAL) BY MOUTH DAILY WITH BREAKFAST. 90 tablet 2  . naproxen (  NAPROSYN) 500 MG tablet TAKE 1 TABLET (500 MG TOTAL) BY MOUTH 2 (TWO) TIMES DAILY. 180 tablet 3  . rosuvastatin (CRESTOR) 40 MG tablet Take 1 tablet (40 mg total) by mouth daily. 90 tablet 3  . sertraline (ZOLOFT) 50 MG tablet TAKE 1 TABLET (50 MG TOTAL) BY MOUTH DAILY. 90 tablet 2  . triamcinolone cream (KENALOG) 0.1 % Apply 1 application topically 4 (four) times daily. As needed for rash 30 g 0  . fluticasone (FLONASE) 50 MCG/ACT nasal spray Place 2 sprays into the nose daily. 16 g 5   No current facility-administered medications on file prior to visit.     Review of Systems Constitutional: Negative for other unusual diaphoresis, sweats, appetite or weight changes HENT: Negative for other worsening hearing loss, ear pain, facial swelling, mouth sores or neck stiffness.   Eyes: Negative for other worsening pain, redness or other visual disturbance.  Respiratory: Negative for other stridor or swelling Cardiovascular: Negative for other palpitations or other chest pain  Gastrointestinal: Negative for worsening diarrhea or loose stools, blood in stool, distention or other pain Genitourinary: Negative for hematuria, flank pain or other change in urine volume.  Musculoskeletal: Negative for myalgias or other joint swelling.  Skin: Negative for other color change, or other wound or worsening drainage.  Neurological: Negative for other syncope or numbness. Hematological: Negative for other adenopathy or swelling Psychiatric/Behavioral: Negative for hallucinations, other worsening agitation, SI, self-injury, or new decreased concentration All other system neg per pt    Objective:   Physical Exam BP 136/86   Pulse 91   Ht 5\' 5"  (1.651 m)   Wt 181 lb (82.1 kg)   SpO2 100%   BMI 30.12 kg/m  VS noted,  Constitutional: Pt is oriented to person, place, and time. Appears well-developed and well-nourished, in no significant distress and comfortable Head: Normocephalic and atraumatic  Eyes: Conjunctivae and EOM are normal. Pupils are equal, round, and reactive to light Right Ear: External ear normal without discharge Left Ear: External ear normal without discharge Nose: Nose without discharge or deformity Mouth/Throat: Oropharynx is without other ulcerations and moist  Neck: Normal range of motion. Neck supple. No JVD present. No tracheal deviation present or significant neck LA or mass Cardiovascular: Normal rate, regular rhythm, normal heart sounds and intact distal pulses.   Pulmonary/Chest: WOB normal and breath sounds without rales or wheezing   Abdominal: Soft. Bowel sounds are normal. NT. No HSM  Musculoskeletal: Normal range of motion. Exhibits no edema, has right lumbar paravertebral tender with out swelling or rash Lymphadenopathy: Has no other cervical adenopathy.  Neurological: Pt is alert and oriented to person, place, and time. Pt has normal reflexes. No cranial nerve deficit. Motor 5/5 intact, Gait intact, sens intact to LT tthroughout. Dorsalis pedic 1+ bilat Skin: Skin is warm and dry. No rash noted or new ulcerations Psychiatric:  Has mild dysphoric mood and affect. Behavior is normal without agitation No other exam findings  Lab Results  Component Value Date   WBC 9.1 10/20/2015   HGB 12.7 10/20/2015   HCT 38.8 10/20/2015   PLT 242.0 10/20/2015   GLUCOSE 87 11/03/2015   CHOL 172 11/03/2015   TRIG 72.0 11/03/2015   HDL 50.50 11/03/2015   LDLDIRECT 153.3 05/19/2007   LDLCALC 107 (H) 11/03/2015   ALT 21 11/03/2015   AST 17 11/03/2015   NA 141 11/03/2015   K 3.9 11/03/2015   CL 108 11/03/2015   CREATININE 0.79 11/03/2015   BUN 13 11/03/2015  CO2 28 11/03/2015   TSH 2.12 10/20/2015   HGBA1C 5.9 05/17/2016   MICROALBUR 1.0 10/20/2015       Assessment & Plan:

## 2016-11-29 NOTE — Assessment & Plan Note (Signed)
Mild, delcines MRI due to claustrophobia even with valium, ok for refereal to Dr Smith/sport med

## 2016-12-01 LAB — HIV ANTIBODY (ROUTINE TESTING W REFLEX): HIV 1&2 Ab, 4th Generation: NONREACTIVE

## 2016-12-16 ENCOUNTER — Other Ambulatory Visit: Payer: Self-pay | Admitting: Internal Medicine

## 2017-01-03 ENCOUNTER — Encounter: Payer: Self-pay | Admitting: Family Medicine

## 2017-01-03 ENCOUNTER — Ambulatory Visit (INDEPENDENT_AMBULATORY_CARE_PROVIDER_SITE_OTHER): Payer: Managed Care, Other (non HMO) | Admitting: Family Medicine

## 2017-01-03 ENCOUNTER — Ambulatory Visit (INDEPENDENT_AMBULATORY_CARE_PROVIDER_SITE_OTHER)
Admission: RE | Admit: 2017-01-03 | Discharge: 2017-01-03 | Disposition: A | Discharge status: Home / Self Care | Payer: Managed Care, Other (non HMO) | Source: Ambulatory Visit | Attending: Family Medicine | Admitting: Family Medicine

## 2017-01-03 VITALS — BP 130/92 | HR 88 | Ht 64.0 in | Wt 178.0 lb

## 2017-01-03 DIAGNOSIS — M5416 Radiculopathy, lumbar region: Secondary | ICD-10-CM | POA: Diagnosis not present

## 2017-01-03 MED ORDER — PREDNISONE 50 MG PO TABS: 50.0000 mg | ORAL_TABLET | Freq: Every day | ORAL | 0 refills | Status: DC

## 2017-01-03 MED ORDER — GABAPENTIN 100 MG PO CAPS: 200.0000 mg | ORAL_CAPSULE | Freq: Every day | ORAL | 3 refills | Status: DC

## 2017-01-03 NOTE — Progress Notes (Signed)
[facility] [place], [place] [address] Phone: [phone] Subjective:    I'm seeing this patient by the request  of:  Erica Borg, MD   CC: right leg pain   [id number]  Erica Mills is a 61 y.o. female coming in with complaint of Right leg pain. She has right leg pain that has been bothering her for the past 6 months with radiating pain down that leg. She also has some arthritis that is in both hands that she would like looked.   Onset- 6 months  Location- right leg posterior aspect Duration- intermittent, mostly with sedentary activity  Character- numb, sharp, tingly Aggravating factors- sedentary activity Reliving factors- topical analgesic, Aleve Therapies tried-   Severity- 8 out of 10     Past Medical History:  Diagnosis Date  . ACHILLES TENDINITIS 10/06/2009  . ALLERGIC RHINITIS 05/19/2007  . CARPAL TUNNEL SYNDROME, BILATERAL 05/19/2007  . Cyst of breast, right, solitary 07/2012  . ELBOW PAIN, RIGHT 04/21/2008  . Fibroids   . GLUCOSE INTOLERANCE 02/14/2009  . H/O menorrhagia   . HYPERLIPIDEMIA 05/19/2007  . HYPERTENSION 05/19/2007  . HYPERTHYROIDISM 03/10/2009  . Lateral epicondylitis  of elbow 04/21/2008  . SINUSITIS- ACUTE-NOS 05/11/2010  . SMOKER 05/19/2007   Past Surgical History:  Procedure Laterality Date  . CESAREAN SECTION    . TONSILLECTOMY     Social History   Social History  . Marital status: Married    Spouse name: N/A  . Number of children: N/A  . Years of education: N/A   Social History Main Topics  . Smoking status: Former Research scientist (life sciences)  . Smokeless tobacco: Never Used     Comment: QUIT SMOKING X 4 WEEKS  . Alcohol use No  . Drug use: No  . Sexual activity: Yes    Birth control/ protection: Surgical, Post-menopausal     Comment: BTL   Other Topics Concern  . Not on file   Social History Narrative  . No narrative on file   Allergies  Allergen Reactions  . Sulfonamide Derivatives     REACTION:  rash   Family History  Problem Relation Age of Onset  . Hypertension Mother   . Diabetes Mother   . Diabetes Father   . Hypertension Father   . Cancer Paternal Uncle        colon cancer  . Diabetes Sister      Past medical history, social, surgical and family history all reviewed in electronic medical record.  No pertanent information unless stated regarding to the chief complaint.   Review of Systems:Review of systems updated and as accurate as of 01/03/17  No headache, visual changes, nausea, vomiting, diarrhea, constipation, dizziness, abdominal pain, skin rash, fevers, chills, night sweats, weight loss, swollen lymph nodes, body aches, joint swelling, muscle aches, chest pain, shortness of breath, mood changes.   Objective  There were no vitals taken for this visit. Systems examined below as of 01/03/17   General: No apparent distress alert and oriented x3 mood and affect normal, dressed appropriately.  HEENT: Pupils equal, extraocular movements intact  Respiratory: Patient's speak in full sentences and does not appear short of breath  Cardiovascular: No lower extremity edema, non tender, no erythema  Skin: Warm dry intact with no signs of infection or rash on extremities or on axial skeleton.  Abdomen: Soft nontender  Neuro: Cranial nerves II through XII are intact, neurovascularly intact in all extremities with 2+ DTRs and 2+  pulses.  Lymph: No lymphadenopathy of posterior or anterior cervical chain or axillae bilaterally.  Gait normal with good balance and coordination.  MSK:  Non tender with full range of motion and good stability and symmetric strength and tone of shoulders, elbows, wrist, hip, knee and ankles bilaterally. Mild arthritic changes of multiple joints Back Exam:  Inspection: Unremarkable  Motion: Flexion 35 deg, Extension 25 deg, Side Bending to 35 deg bilaterally,  Rotation to 35 deg bilaterally  SLR laying: Positive right XSLR laying: Negative  Palpable  tenderness: . Tender to palpation in the paraspinal muscle which are on the right side of the lumbar spine FABER: negative. Sensory change: Gross sensation intact to all lumbar and sacral dermatomes.  Reflexes: 2+ at both patellar tendons, 2+ at achilles tendons, Babinski's downgoing.  Strength at foot  Plantar-flexion: 5/5 Dorsi-flexion: 5/5 Eversion: 5/5 Inversion: 5/5  Leg strength  Quad: 5/5 Hamstring: 5/5 Hip flexor: 5/5 Hip abductors: 5/5  Gait unremarkable.  Procedure note 13244; 15 minutes spent for Therapeutic exercises as stated in above notes.  This included exercises focusing on stretching, strengthening, with significant focus on eccentric aspects.Low back exercises that included:  Pelvic tilt/bracing instruction to focus on control of the pelvic girdle and lower abdominal muscles  Glute strengthening exercises, focusing on proper firing of the glutes without engaging the low back muscles Proper stretching techniques for maximum relief for the hamstrings, hip flexors, low back and some rotation where tolerated     Proper technique shown and discussed handout in great detail with ATC.  All questions were discussed and answered.      Impression and Recommendations:     This case required medical decision making of moderate complexity.      Note: This dictation was prepared with Dragon dictation along with smaller phrase technology. Any transcriptional errors that result from this process are unintentional.

## 2017-01-03 NOTE — Assessment & Plan Note (Signed)
I believe the patient is having more of a lumbar radiculitis. Patient's flank pain does not seem to be severe on the lower extremity. I do not think we are missing any site of clot with no swelling. No Pain. Patient does have a positive straight leg test. X-rays ordered today. We discussed prednisone and gabapentin and warned of potential side effects. We discussed icing regimen. Home exercises. Follow-up again in 2 weeks

## 2017-01-03 NOTE — Patient Instructions (Signed)
Good to see you  Xrays downstairs today  I am sorry for your loss but you are doing great! Prednisone daily for 5 days Gabapentin 200mg  at night Exercises 3 times a week.  Ice 20 minutes 2 times daily. Usually after activity and before bed. See me again in 2 weeks

## 2017-01-17 ENCOUNTER — Ambulatory Visit (INDEPENDENT_AMBULATORY_CARE_PROVIDER_SITE_OTHER): Payer: Managed Care, Other (non HMO) | Admitting: Family Medicine

## 2017-01-17 ENCOUNTER — Encounter: Payer: Self-pay | Admitting: Family Medicine

## 2017-01-17 DIAGNOSIS — M5416 Radiculopathy, lumbar region: Secondary | ICD-10-CM | POA: Diagnosis not present

## 2017-01-17 NOTE — Patient Instructions (Signed)
Good to see you  Alvera Singh is your friend.  Stay active We will get you back to work on Wednesday  Exercises 3 times a week.  See me again in 3 weeks.

## 2017-01-17 NOTE — Progress Notes (Signed)
[name] [place], [place] [address] Phone: [phone] Subjective:    I'm seeing this patient by the request  of:  Biagio Borg, MD   CC: right leg pain   [id number]  Erica Mills is a 61 y.o. female coming in with complaint of Right leg pain.Patient was having more of a lumbar radiculopathy when we saw patient. Patient does have degenerative disc disease. Did have x-rays. This was independently visualized by me. Works at [employer]. Patient was started on prednisone as well as gabapentin. She was on the prednisone was making some improvement and now having some mild worsening pain again. Still better than what it was previously but not back to her baseline.      Past Medical History:  Diagnosis Date  . ACHILLES TENDINITIS 10/06/2009  . ALLERGIC RHINITIS 05/19/2007  . CARPAL TUNNEL SYNDROME, BILATERAL 05/19/2007  . Cyst of breast, right, solitary 07/2012  . ELBOW PAIN, RIGHT 04/21/2008  . Fibroids   . GLUCOSE INTOLERANCE 02/14/2009  . H/O menorrhagia   . HYPERLIPIDEMIA 05/19/2007  . HYPERTENSION 05/19/2007  . HYPERTHYROIDISM 03/10/2009  . Lateral epicondylitis  of elbow 04/21/2008  . SINUSITIS- ACUTE-NOS 05/11/2010  . SMOKER 05/19/2007   Past Surgical History:  Procedure Laterality Date  . CESAREAN SECTION    . TONSILLECTOMY     Social History   Social History  . Marital status: Married    Spouse name: N/A  . Number of children: N/A  . Years of education: N/A   Social History Main Topics  . Smoking status: Former Research scientist (life sciences)  . Smokeless tobacco: Never Used     Comment: QUIT SMOKING X 4 WEEKS  . Alcohol use No  . Drug use: No  . Sexual activity: Yes    Birth control/ protection: Surgical, Post-menopausal     Comment: BTL   Other Topics Concern  . Not on file   Social History Narrative  . No narrative on file   Allergies  Allergen Reactions  . Sulfonamide Derivatives     REACTION: rash   Family History  Problem  Relation Age of Onset  . Hypertension Mother   . Diabetes Mother   . Diabetes Father   . Hypertension Father   . Cancer Paternal Uncle        colon cancer  . Diabetes Sister      Past medical history, social, surgical and family history all reviewed in electronic medical record.  No pertanent information unless stated regarding to the chief complaint.   Review of Systems: No headache, visual changes, nausea, vomiting, diarrhea, constipation, dizziness, abdominal pain, skin rash, fevers, chills, night sweats, weight loss, swollen lymph nodes, body aches, joint swelling, muscle aches, chest pain, shortness of breath, mood changes.    Objective  There were no vitals taken for this visit.   General: NAD A&O x3 mood, affect normal  HEENT: Pupils equal, extraocular movements intact no nystagmus Respiratory: not short of breath at rest or with speaking Cardiovascular: No lower extremity edema, non tender Skin: Warm dry intact with no signs of infection or rash on extremities or on axial skeleton. Abdomen: Soft nontender, no masses Neuro: Cranial nerves  intact, neurovascularly intact in all extremities with 2+ DTRs and 2+ pulses. Lymph: No lymphadenopathy appreciated today  Gait normal with good balance and coordination.  MSK: Non tender with full range of motion and good stability and symmetric strength and tone of shoulders, elbows, wrist,  knee hips  and ankles bilaterally.  Arthritic changes of multiple joints Back Exam:  Inspection: Unremarkable  Motion: Flexion 45 deg, Extension 20 deg, Side Bending to 45 deg bilaterally,  Rotation to 45 deg bilaterally  SLR laying: Negative this is an improvement XSLR laying: Negative  Palpable tenderness: Still mild tenderness to palpation in the paraspinal musculature right greater than left. FABER: negative. Sensory change: Gross sensation intact to all lumbar and sacral dermatomes.  Reflexes: 2+ at both patellar tendons, 2+ at achilles  tendons, Babinski's downgoing.  Strength at foot  Plantar-flexion: 5/5 Dorsi-flexion: 5/5 Eversion: 5/5 Inversion: 5/5  Leg strength  Quad: 5/5 Hamstring: 5/5 Hip flexor: 5/5 Hip abductors: 5/5  Gait unremarkable.       Impression and Recommendations:     This case required medical decision making of moderate complexity.      Note: This dictation was prepared with Dragon dictation along with smaller phrase technology. Any transcriptional errors that result from this process are unintentional.

## 2017-01-18 NOTE — Assessment & Plan Note (Signed)
Patient subjectively as well as objectively does look she is having improvement. We discussed icing regimen, given home exercises now. Patient declined formal physical therapy. We'll see how patient response to conservative therapy. Following up again in 3-4 weeks.

## 2017-01-31 ENCOUNTER — Other Ambulatory Visit: Payer: Self-pay | Admitting: Internal Medicine

## 2017-02-07 ENCOUNTER — Encounter: Payer: Self-pay | Admitting: Family Medicine

## 2017-02-07 ENCOUNTER — Ambulatory Visit (INDEPENDENT_AMBULATORY_CARE_PROVIDER_SITE_OTHER): Payer: Managed Care, Other (non HMO) | Admitting: Family Medicine

## 2017-02-07 ENCOUNTER — Ambulatory Visit: Payer: Managed Care, Other (non HMO) | Admitting: Internal Medicine

## 2017-02-07 DIAGNOSIS — M5416 Radiculopathy, lumbar region: Secondary | ICD-10-CM | POA: Diagnosis not present

## 2017-02-07 MED ORDER — VENLAFAXINE HCL ER 37.5 MG PO CP24: 37.5000 mg | ORAL_CAPSULE | Freq: Every day | ORAL | 1 refills | Status: DC

## 2017-02-07 NOTE — Patient Instructions (Addendum)
Good to see you  Ice when you need it Effexor 37.5 mg daily  Continue to do the gabapentin at night pennsaid pinkie amount topically 2 times daily as needed.   See me again in 4-6 weeks.

## 2017-02-07 NOTE — Assessment & Plan Note (Signed)
Continues to have lumbar radiculopathy. We discussed again about advance imaging the patient declined that again. Feels like the most pain seems to be at night. Unfortunately if she increases the gabapentin she has drowsiness in the morning. Started on Effexor low dose and is to decrease sertraline. Hopefully that this can help with the nerve as well as the hot flashes. We discussed icing regimen and continuing the home exercises. Follow-up again in 4 weeks

## 2017-02-07 NOTE — Progress Notes (Signed)
[facility] [place],  [address] Phone: [phone] Subjective:    I'm seeing this patient by the request  of:  Erica Borg, MD   CC: right leg pain   [id number]  Erica Mills is a 61 y.o. female coming in with complaint of Right leg pain.Patient was having more of a lumbar radiculopathy when we saw patient. Patient does have degenerative disc disease. Did have x-rays. This was independently visualized by me. Works at [employer]. Patient was started on prednisone as well as gabapentin. Patient had return to work. Hasn't been doing relatively well. Patient states still on the end of a long day she feels like her right leg is almost week. Has difficulty even climbing in and out of the bed sometimes. States that during the morning seems to do better. Feels that the work environment and still is contributing to most of her discomfort and pain.      Past Medical History:  Diagnosis Date  . ACHILLES TENDINITIS 10/06/2009  . ALLERGIC RHINITIS 05/19/2007  . CARPAL TUNNEL SYNDROME, BILATERAL 05/19/2007  . Cyst of breast, right, solitary 07/2012  . ELBOW PAIN, RIGHT 04/21/2008  . Fibroids   . GLUCOSE INTOLERANCE 02/14/2009  . H/O menorrhagia   . HYPERLIPIDEMIA 05/19/2007  . HYPERTENSION 05/19/2007  . HYPERTHYROIDISM 03/10/2009  . Lateral epicondylitis  of elbow 04/21/2008  . SINUSITIS- ACUTE-NOS 05/11/2010  . SMOKER 05/19/2007   Past Surgical History:  Procedure Laterality Date  . CESAREAN SECTION    . TONSILLECTOMY     Social History   Social History  . Marital status: Married    Spouse name: N/A  . Number of children: N/A  . Years of education: N/A   Social History Main Topics  . Smoking status: Former Research scientist (life sciences)  . Smokeless tobacco: Never Used     Comment: QUIT SMOKING X 4 WEEKS  . Alcohol use No  . Drug use: No  . Sexual activity: Yes    Birth control/ protection: Surgical, Post-menopausal     Comment: BTL   Other Topics Concern    . None   Social History Narrative  . None   Allergies  Allergen Reactions  . Sulfonamide Derivatives     REACTION: rash   Family History  Problem Relation Age of Onset  . Hypertension Mother   . Diabetes Mother   . Diabetes Father   . Hypertension Father   . Cancer Paternal Uncle        colon cancer  . Diabetes Sister      Past medical history, social, surgical and family history all reviewed in electronic medical record.  No pertanent information unless stated regarding to the chief complaint.   Review of Systems: No headache, visual changes, nausea, vomiting, diarrhea, constipation, dizziness, abdominal pain, skin rash, fevers, chills, night sweats, weight loss, swollen lymph nodes, body aches, joint swelling, muscle aches, chest pain, shortness of breath, mood changes.     Objective  Blood pressure 130/90, pulse 78, weight 186 lb (84.4 kg).   Systems examined below as of 02/07/17 General: NAD A&O x3 mood, affect normal  HEENT: Pupils equal, extraocular movements intact no nystagmus Respiratory: not short of breath at rest or with speaking Cardiovascular: No lower extremity edema, non tender Skin: Warm dry intact with no signs of infection or rash on extremities or on axial skeleton. Abdomen: Soft nontender, no masses Neuro: Cranial nerves  intact, neurovascularly intact in all extremities with 2+ DTRs  and 2+ pulses. Lymph: No lymphadenopathy appreciated today  Gait normal with good balance and coordination.  MSK: Non tender with full range of motion and good stability and symmetric strength and tone of shoulders, elbows, wrist,  knee hips and ankles bilaterally.    Arthritic changes of multiple joints Back Exam:  Inspection: Mild loss of lordosis of Motion: Flexion 45 deg, Extension 25 deg, Side Bending to 45 deg bilaterally,  Rotation to 45 deg bilaterally  SLR laying: Negative  XSLR laying: Negative  Palpable tenderness: Significant tenderness of the paraspinal  musculature lumbar spine bilaterally. FABER: Tightness bilaterally. Sensory change: Gross sensation intact to all lumbar and sacral dermatomes.  Reflexes: 2+ at both patellar tendons, 2+ at achilles tendons, Babinski's downgoing.  Strength at foot  Plantar-flexion: 5/5 Dorsi-flexion: 5/5 Eversion: 5/5 Inversion: 5/5  Leg strength  Quad: 5/5 Hamstring: 5/5 Hip flexor: 5/5 Hip abductors: 4/5 but symmetric Gait unremarkable.        Impression and Recommendations:     This case required medical decision making of moderate complexity.      Note: This dictation was prepared with Dragon dictation along with smaller phrase technology. Any transcriptional errors that result from this process are unintentional.

## 2017-02-24 ENCOUNTER — Other Ambulatory Visit: Payer: Self-pay | Admitting: *Deleted

## 2017-02-24 MED ORDER — GABAPENTIN 100 MG PO CAPS: 200.0000 mg | ORAL_CAPSULE | Freq: Every day | ORAL | 1 refills | Status: DC

## 2017-02-24 NOTE — Telephone Encounter (Signed)
Refill done.  

## 2017-03-04 ENCOUNTER — Ambulatory Visit (INDEPENDENT_AMBULATORY_CARE_PROVIDER_SITE_OTHER): Payer: Managed Care, Other (non HMO) | Admitting: General Practice

## 2017-03-04 DIAGNOSIS — Z111 Encounter for screening for respiratory tuberculosis: Secondary | ICD-10-CM | POA: Diagnosis not present

## 2017-03-06 LAB — TB SKIN TEST
INDURATION: 0 mm
TB SKIN TEST: NEGATIVE

## 2017-03-07 ENCOUNTER — Ambulatory Visit (INDEPENDENT_AMBULATORY_CARE_PROVIDER_SITE_OTHER): Payer: Managed Care, Other (non HMO) | Admitting: Family Medicine

## 2017-03-07 ENCOUNTER — Telehealth: Payer: Self-pay

## 2017-03-07 ENCOUNTER — Encounter: Payer: Self-pay | Admitting: Family Medicine

## 2017-03-07 DIAGNOSIS — M545 Low back pain, unspecified: Secondary | ICD-10-CM

## 2017-03-07 DIAGNOSIS — G8929 Other chronic pain: Secondary | ICD-10-CM

## 2017-03-07 NOTE — Assessment & Plan Note (Signed)
Patient is not having any significant difficulty at this time. 100% better. Discontinue the Effexor. Continue the gabapentin as needed. Follow-up with me as needed.

## 2017-03-07 NOTE — Telephone Encounter (Signed)
Called pt to inform her CPE form is filled out however she doesn't have a VM set up  Form is at front desk

## 2017-03-07 NOTE — Progress Notes (Signed)
[name] [address], [place] [address] Phone: [phone] Subjective:    I'm seeing this patient by the request  of:    CC: Low back pain follow-up  PNT:[id number]  Erica Mills is a 61 y.o. female coming in with a follow up of back pain. Patient was having more of a lumbar radiculopathy. Patient was started on the gabapentin when she gave her some difficulty from time to time but states and does help her with sleep. Patient was to start Effexor but states that that made her feel even more sleepy. Not taking on a regular basis. Patient did discontinue working on a regular basis and because that she states that she is 100% better. Not having any pain at this time.      Past Medical History:  Diagnosis Date  . ACHILLES TENDINITIS 10/06/2009  . ALLERGIC RHINITIS 05/19/2007  . CARPAL TUNNEL SYNDROME, BILATERAL 05/19/2007  . Cyst of breast, right, solitary 07/2012  . ELBOW PAIN, RIGHT 04/21/2008  . Fibroids   . GLUCOSE INTOLERANCE 02/14/2009  . H/O menorrhagia   . HYPERLIPIDEMIA 05/19/2007  . HYPERTENSION 05/19/2007  . HYPERTHYROIDISM 03/10/2009  . Lateral epicondylitis  of elbow 04/21/2008  . SINUSITIS- ACUTE-NOS 05/11/2010  . SMOKER 05/19/2007   Past Surgical History:  Procedure Laterality Date  . CESAREAN SECTION    . TONSILLECTOMY     Social History   Social History  . Marital status: Married    Spouse name: N/A  . Number of children: N/A  . Years of education: N/A   Social History Main Topics  . Smoking status: Former Research scientist (life sciences)  . Smokeless tobacco: Never Used     Comment: QUIT SMOKING X 4 WEEKS  . Alcohol use No  . Drug use: No  . Sexual activity: Yes    Birth control/ protection: Surgical, Post-menopausal     Comment: BTL   Other Topics Concern  . None   Social History Narrative  . None   Allergies  Allergen Reactions  . Sulfonamide Derivatives     REACTION: rash   Family History  Problem Relation Age of Onset  .  Hypertension Mother   . Diabetes Mother   . Diabetes Father   . Hypertension Father   . Cancer Paternal Uncle        colon cancer  . Diabetes Sister      Past medical history, social, surgical and family history all reviewed in electronic medical record.  No pertanent information unless stated regarding to the chief complaint.   Review of Systems:Review of systems updated and as accurate as of 03/07/17  No headache, visual changes, nausea, vomiting, diarrhea, constipation, dizziness, abdominal pain, skin rash, fevers, chills, night sweats, weight loss, swollen lymph nodes, body aches, joint swelling, chest pain, shortness of breath, mood changes. Positive muscle aches  Objective  Blood pressure 140/80, pulse 84, height 5\' 2"  (1.575 m), weight 186 lb (84.4 kg), SpO2 96 %. Systems examined below as of 03/07/17   General: No apparent distress alert and oriented x3 mood and affect normal, dressed appropriately.  HEENT: Pupils equal, extraocular movements intact  Respiratory: Patient's speak in full sentences and does not appear short of breath  Cardiovascular: No lower extremity edema, non tender, no erythema  Skin: Warm dry intact with no signs of infection or rash on extremities or on axial skeleton.  Abdomen: Soft nontender  Neuro: Cranial nerves II through XII are intact, neurovascularly intact in all extremities  with 2+ DTRs and 2+ pulses.  Lymph: No lymphadenopathy of posterior or anterior cervical chain or axillae bilaterally.  Gait normal with good balance and coordination.  MSK:  Non tender with full range of motion and good stability and symmetric strength and tone of shoulders, elbows, wrist, hip, knee and ankles bilaterally.  Back Exam:  Inspection: Mild loss of lordosis Motion: Flexion 45 deg, Extension 45 deg, Side Bending to 45 deg bilaterally,  Rotation to 45 deg bilaterally  SLR laying: Negative  XSLR laying: Negative  Palpable tenderness: Minimal discomfort in the  paraspinal musculature. FABER: negative. Sensory change: Gross sensation intact to all lumbar and sacral dermatomes.  Reflexes: 2+ at both patellar tendons, 2+ at achilles tendons, Babinski's downgoing.  Strength at foot  Plantar-flexion: 5/5 Dorsi-flexion: 5/5 Eversion: 5/5 Inversion: 5/5  Leg strength  Quad: 5/5 Hamstring: 5/5 Hip flexor: 5/5 Hip abductors: 5/5  Gait unremarkable.     Impression and Recommendations:     This case required medical decision making of moderate complexity.      Note: This dictation was prepared with Dragon dictation along with smaller phrase technology. Any transcriptional errors that result from this process are unintentional.

## 2017-04-14 ENCOUNTER — Other Ambulatory Visit: Payer: Self-pay

## 2017-04-14 MED ORDER — METFORMIN HCL ER 500 MG PO TB24: ORAL_TABLET | ORAL | 1 refills | Status: DC

## 2017-05-15 ENCOUNTER — Other Ambulatory Visit: Payer: Self-pay | Admitting: Internal Medicine

## 2017-05-30 ENCOUNTER — Other Ambulatory Visit (INDEPENDENT_AMBULATORY_CARE_PROVIDER_SITE_OTHER): Payer: BLUE CROSS/BLUE SHIELD

## 2017-05-30 ENCOUNTER — Ambulatory Visit: Payer: BLUE CROSS/BLUE SHIELD | Admitting: Internal Medicine

## 2017-05-30 ENCOUNTER — Encounter: Payer: Self-pay | Admitting: Internal Medicine

## 2017-05-30 VITALS — BP 138/86 | HR 82 | Temp 97.9°F | Ht 62.0 in | Wt 193.0 lb

## 2017-05-30 DIAGNOSIS — E785 Hyperlipidemia, unspecified: Secondary | ICD-10-CM

## 2017-05-30 DIAGNOSIS — E119 Type 2 diabetes mellitus without complications: Secondary | ICD-10-CM

## 2017-05-30 DIAGNOSIS — I10 Essential (primary) hypertension: Secondary | ICD-10-CM

## 2017-05-30 DIAGNOSIS — R809 Proteinuria, unspecified: Secondary | ICD-10-CM | POA: Diagnosis not present

## 2017-05-30 DIAGNOSIS — M5136 Other intervertebral disc degeneration, lumbar region: Secondary | ICD-10-CM | POA: Insufficient documentation

## 2017-05-30 LAB — LIPID PANEL
CHOLESTEROL: 140 mg/dL (ref 0–200)
HDL: 60.5 mg/dL (ref >39.00)
LDL Cholesterol: 67 mg/dL (ref 0–99)
NonHDL: 79.37
Total CHOL/HDL Ratio: 2
Triglycerides: 64 mg/dL (ref 0.0–149.0)
VLDL: 12.8 mg/dL (ref 0.0–40.0)

## 2017-05-30 LAB — URINALYSIS, ROUTINE W REFLEX MICROSCOPIC
Bilirubin Urine: NEGATIVE
Ketones, ur: NEGATIVE
LEUKOCYTES UA: NEGATIVE
Nitrite: NEGATIVE
PH: 6 (ref 5.0–8.0)
SPECIFIC GRAVITY, URINE: 1.025 (ref 1.000–1.030)
Total Protein, Urine: 30 — AB
Urine Glucose: NEGATIVE
Urobilinogen, UA: 0.2 (ref 0.0–1.0)

## 2017-05-30 LAB — BASIC METABOLIC PANEL
BUN: 19 mg/dL (ref 6–23)
CALCIUM: 9.7 mg/dL (ref 8.4–10.5)
CO2: 28 mEq/L (ref 19–32)
CREATININE: 1.08 mg/dL (ref 0.40–1.20)
Chloride: 105 mEq/L (ref 96–112)
GFR: 66.13 mL/min (ref >60.00)
Glucose, Bld: 95 mg/dL (ref 70–99)
Potassium: 3.6 mEq/L (ref 3.5–5.1)
Sodium: 140 mEq/L (ref 135–145)

## 2017-05-30 LAB — HEPATIC FUNCTION PANEL
ALK PHOS: 69 U/L (ref 39–117)
ALT: 14 U/L (ref 0–35)
AST: 15 U/L (ref 0–37)
Albumin: 4.3 g/dL (ref 3.5–5.2)
BILIRUBIN DIRECT: 0.1 mg/dL (ref 0.0–0.3)
TOTAL PROTEIN: 7.5 g/dL (ref 6.0–8.3)
Total Bilirubin: 0.4 mg/dL (ref 0.2–1.2)

## 2017-05-30 LAB — HEMOGLOBIN A1C: HEMOGLOBIN A1C: 6.7 % — AB (ref 4.6–6.5)

## 2017-05-30 MED ORDER — ZOSTER VAC RECOMB ADJUVANTED 50 MCG/0.5ML IM SUSR: 0.5000 mL | Freq: Once | INTRAMUSCULAR | 1 refills | Status: AC

## 2017-05-30 NOTE — Patient Instructions (Addendum)
Your shingles shot was sent to the pharmacy  Please continue all other medications as before, and refills have been done if requested.  Please have the pharmacy call with any other refills you may need.  Please continue your efforts at being more active, low cholesterol diet, and weight control..  Please keep your appointments with your specialists as you may have planned  Please go to the LAB in the Basement (turn left off the elevator) for the tests to be done today  You will be contacted by phone if any changes need to be made immediately.  Otherwise, you will receive a letter about your results with an explanation, but please check with MyChart first.  Please remember to sign up for MyChart if you have not done so, as this will be important to you in the future with finding out test results, communicating by private email, and scheduling acute appointments online when needed.  Please return in 6 months, or sooner if needed, with Lab testing done 3-5 days before

## 2017-05-30 NOTE — Progress Notes (Signed)
Subjective:    Patient ID: Erica Mills, female    DOB: [birth year], 61 y.o.   MRN: [id number]  HPI  Here to f/u; overall doing ok,  Pt denies chest pain, increasing sob or doe, wheezing, orthopnea, PND, increased LE swelling, palpitations, dizziness or syncope.  Pt denies new neurological symptoms such as new headache, or facial or extremity weakness or numbness.  Pt denies polydipsia, polyuria, or low sugar episode.  Pt states overall good compliance with meds, mostly trying to follow appropriate diet, with wt overall stable, but has been walking 1 mile per day but not lately due to weather.  Wt Readings from Last 3 Encounters:  05/30/17 193 lb (87.5 kg)  03/07/17 186 lb (84.4 kg)  02/07/17 186 lb (84.4 kg)  Has yearly pap and mammogram.  Denies worsening reflux, abd pain, dysphagia, n/v, bowel change or blood, except for mild worsening recent constipation.  Denies urinary symptoms such as dysuria, frequency, urgency, flank pain, hematuria or n/v, fever, chills. No other new complaints or interval hx Past Medical History:  Diagnosis Date  . ACHILLES TENDINITIS 10/06/2009  . ALLERGIC RHINITIS 05/19/2007  . CARPAL TUNNEL SYNDROME, BILATERAL 05/19/2007  . Cyst of breast, right, solitary 07/2012  . ELBOW PAIN, RIGHT 04/21/2008  . Fibroids   . GLUCOSE INTOLERANCE 02/14/2009  . H/O menorrhagia   . HYPERLIPIDEMIA 05/19/2007  . HYPERTENSION 05/19/2007  . HYPERTHYROIDISM 03/10/2009  . Lateral epicondylitis  of elbow 04/21/2008  . SINUSITIS- ACUTE-NOS 05/11/2010  . SMOKER 05/19/2007   Past Surgical History:  Procedure Laterality Date  . CESAREAN SECTION    . TONSILLECTOMY      reports that she has quit smoking. she has never used smokeless tobacco. She reports that she does not drink alcohol or use drugs. family history includes Cancer in her paternal uncle; Diabetes in her father, mother, and sister; Hypertension in her father and mother. Allergies  Allergen Reactions  . Sulfonamide Derivatives       REACTION: rash   Current Outpatient Medications on File Prior to Visit  Medication Sig Dispense Refill  . amLODipine (NORVASC) 10 MG tablet TAKE 1 TABLET (10 MG TOTAL) BY MOUTH DAILY. 90 tablet 3  . aspirin 81 MG tablet Take 81 mg by mouth daily.      Marland Kitchen atorvastatin (LIPITOR) 40 MG tablet TAKE 1 TABLET (40 MG TOTAL) BY MOUTH DAILY. 90 tablet 2  . cetirizine (ZYRTEC) 10 MG tablet Take 1 tablet (10 mg total) by mouth daily. 30 tablet 11  . cyclobenzaprine (FLEXERIL) 5 MG tablet Take 1 tablet (5 mg total) by mouth 3 (three) times daily as needed for muscle spasms. 30 tablet 2  . gabapentin (NEURONTIN) 100 MG capsule Take 2 capsules (200 mg total) by mouth at bedtime. 180 capsule 1  . irbesartan (AVAPRO) 150 MG tablet TAKE 1 TABLET BY MOUTH EVERY DAY 90 tablet 2  . metFORMIN (GLUCOPHAGE-XR) 500 MG 24 hr tablet TAKE 1 TABLET (500 MG TOTAL) BY MOUTH DAILY WITH BREAKFAST. 90 tablet 1  . naproxen (NAPROSYN) 500 MG tablet TAKE 1 TABLET (500 MG TOTAL) BY MOUTH 2 (TWO) TIMES DAILY. 180 tablet 3  . rosuvastatin (CRESTOR) 40 MG tablet TAKE 1 TABLET (40 MG TOTAL) BY MOUTH DAILY. 90 tablet 1  . triamcinolone cream (KENALOG) 0.1 % Apply 1 application topically 4 (four) times daily. As needed for rash 30 g 0  . fluticasone (FLONASE) 50 MCG/ACT nasal spray Place 2 sprays into the nose daily. 16 g 5  No current facility-administered medications on file prior to visit.    Review of Systems  Constitutional: Negative for other unusual diaphoresis or sweats HENT: Negative for ear discharge or swelling Eyes: Negative for other worsening visual disturbances Respiratory: Negative for stridor or other swelling  Gastrointestinal: Negative for worsening distension or other blood Genitourinary: Negative for retention or other urinary change Musculoskeletal: Negative for other MSK pain or swelling Skin: Negative for color change or other new lesions Neurological: Negative for worsening tremors and other numbness   Psychiatric/Behavioral: Negative for worsening agitation or other fatigue All other system neg per pt    Objective:   Physical Exam BP 138/86   Pulse 82   Temp 97.9 F (36.6 C) (Oral)   Ht 5\' 2"  (1.575 m)   Wt 193 lb (87.5 kg)   SpO2 98%   BMI 35.30 kg/m  VS noted,  Constitutional: Pt appears in NAD HENT: Head: NCAT.  Right Ear: External ear normal.  Left Ear: External ear normal.  Eyes: . Pupils are equal, round, and reactive to light. Conjunctivae and EOM are normal Nose: without d/c or deformity Neck: Neck supple. Gross normal ROM Cardiovascular: Normal rate and regular rhythm.   Pulmonary/Chest: Effort normal and breath sounds without rales or wheezing.  Abd:  Soft, NT, ND, + BS, no organomegaly Neurological: Pt is alert. At baseline orientation, motor grossly intact Skin: Skin is warm. No rashes, other new lesions, no LE edema Psychiatric: Pt behavior is normal without agitation  No other exam findings Lab Results  Component Value Date   WBC 11.3 (H) 11/29/2016   HGB 12.9 11/29/2016   HCT 39.3 11/29/2016   PLT 229.0 11/29/2016   GLUCOSE 95 05/30/2017   CHOL 140 05/30/2017   TRIG 64.0 05/30/2017   HDL 60.50 05/30/2017   LDLDIRECT 153.3 05/19/2007   LDLCALC 67 05/30/2017   ALT 14 05/30/2017   AST 15 05/30/2017   NA 140 05/30/2017   K 3.6 05/30/2017   CL 105 05/30/2017   CREATININE 1.08 05/30/2017   BUN 19 05/30/2017   CO2 28 05/30/2017   TSH 2.44 11/29/2016   HGBA1C 6.7 (H) 05/30/2017   MICROALBUR 30.9 (H) 11/29/2016       Assessment & Plan:

## 2017-05-31 NOTE — Assessment & Plan Note (Signed)
stable overall by history and exam, recent data reviewed with pt, and pt to continue medical treatment as before,  to f/u any worsening symptoms or concerns Lab Results  Component Value Date   LDLCALC 67 05/30/2017

## 2017-05-31 NOTE — Assessment & Plan Note (Signed)
With microalbuminuria, to cont arb, declines 24 hr urine

## 2017-05-31 NOTE — Assessment & Plan Note (Signed)
stable overall by history and exam, recent data reviewed with pt, and pt to continue medical treatment as before,  to f/u any worsening symptoms or concerns BP Readings from Last 3 Encounters:  05/30/17 138/86  03/07/17 140/80  02/07/17 130/90

## 2017-05-31 NOTE — Assessment & Plan Note (Signed)
Lab Results  Component Value Date   HGBA1C 6.7 (H) 05/30/2017  stable overall by history and exam, recent data reviewed with pt, and pt to continue medical treatment as before,  to f/u any worsening symptoms or concerns

## 2017-11-22 ENCOUNTER — Other Ambulatory Visit: Payer: Self-pay | Admitting: Internal Medicine

## 2017-11-28 ENCOUNTER — Ambulatory Visit: Payer: BLUE CROSS/BLUE SHIELD | Admitting: Internal Medicine

## 2017-11-28 ENCOUNTER — Other Ambulatory Visit (INDEPENDENT_AMBULATORY_CARE_PROVIDER_SITE_OTHER): Payer: BLUE CROSS/BLUE SHIELD

## 2017-11-28 ENCOUNTER — Encounter: Payer: Self-pay | Admitting: Internal Medicine

## 2017-11-28 VITALS — BP 126/84 | HR 89 | Temp 98.4°F | Ht 63.0 in | Wt 173.0 lb

## 2017-11-28 DIAGNOSIS — E119 Type 2 diabetes mellitus without complications: Secondary | ICD-10-CM

## 2017-11-28 DIAGNOSIS — Z0000 Encounter for general adult medical examination without abnormal findings: Secondary | ICD-10-CM

## 2017-11-28 LAB — CBC WITH DIFFERENTIAL/PLATELET
BASOS PCT: 0.6 % (ref 0.0–3.0)
Basophils Absolute: 0.1 10*3/uL (ref 0.0–0.1)
EOS PCT: 2.3 % (ref 0.0–5.0)
Eosinophils Absolute: 0.2 10*3/uL (ref 0.0–0.7)
HCT: 38.9 % (ref 36.0–46.0)
Hemoglobin: 12.9 g/dL (ref 12.0–15.0)
LYMPHS ABS: 3.3 10*3/uL (ref 0.7–4.0)
Lymphocytes Relative: 35.6 % (ref 12.0–46.0)
MCHC: 33.2 g/dL (ref 30.0–36.0)
MCV: 87.3 fl (ref 78.0–100.0)
MONO ABS: 0.8 10*3/uL (ref 0.1–1.0)
Monocytes Relative: 8.1 % (ref 3.0–12.0)
NEUTROS ABS: 4.9 10*3/uL (ref 1.4–7.7)
NEUTROS PCT: 53.4 % (ref 43.0–77.0)
PLATELETS: 212 10*3/uL (ref 150.0–400.0)
RBC: 4.45 Mil/uL (ref 3.87–5.11)
RDW: 16.9 % — AB (ref 11.5–15.5)
WBC: 9.3 10*3/uL (ref 4.0–10.5)

## 2017-11-28 LAB — HEMOGLOBIN A1C: Hgb A1c MFr Bld: 6.5 % (ref 4.6–6.5)

## 2017-11-28 LAB — URINALYSIS, ROUTINE W REFLEX MICROSCOPIC
Bilirubin Urine: NEGATIVE
KETONES UR: NEGATIVE
LEUKOCYTES UA: NEGATIVE
Nitrite: NEGATIVE
PH: 5.5 (ref 5.0–8.0)
Specific Gravity, Urine: 1.025 (ref 1.000–1.030)
TOTAL PROTEIN, URINE-UPE24: 100 — AB
URINE GLUCOSE: NEGATIVE
UROBILINOGEN UA: 0.2 (ref 0.0–1.0)

## 2017-11-28 LAB — LIPID PANEL
CHOL/HDL RATIO: 2
Cholesterol: 143 mg/dL (ref 0–200)
HDL: 60.5 mg/dL (ref >39.00)
LDL Cholesterol: 69 mg/dL (ref 0–99)
NONHDL: 82.48
TRIGLYCERIDES: 65 mg/dL (ref 0.0–149.0)
VLDL: 13 mg/dL (ref 0.0–40.0)

## 2017-11-28 LAB — BASIC METABOLIC PANEL
BUN: 20 mg/dL (ref 6–23)
CO2: 26 meq/L (ref 19–32)
Calcium: 9.8 mg/dL (ref 8.4–10.5)
Chloride: 109 mEq/L (ref 96–112)
Creatinine, Ser: 1.58 mg/dL — ABNORMAL HIGH (ref 0.40–1.20)
GFR: 42.56 mL/min — AB (ref >60.00)
GLUCOSE: 98 mg/dL (ref 70–99)
POTASSIUM: 3.7 meq/L (ref 3.5–5.1)
SODIUM: 143 meq/L (ref 135–145)

## 2017-11-28 LAB — MICROALBUMIN / CREATININE URINE RATIO
CREATININE, U: 146.2 mg/dL
MICROALB/CREAT RATIO: 18.6 mg/g (ref 0.0–30.0)
Microalb, Ur: 27.2 mg/dL — ABNORMAL HIGH (ref 0.0–1.9)

## 2017-11-28 LAB — TSH: TSH: 3.64 u[IU]/mL (ref 0.35–4.50)

## 2017-11-28 LAB — HEPATIC FUNCTION PANEL
ALT: 20 U/L (ref 0–35)
AST: 20 U/L (ref 0–37)
Albumin: 4.5 g/dL (ref 3.5–5.2)
Alkaline Phosphatase: 63 U/L (ref 39–117)
Bilirubin, Direct: 0.1 mg/dL (ref 0.0–0.3)
Total Bilirubin: 0.4 mg/dL (ref 0.2–1.2)
Total Protein: 7.6 g/dL (ref 6.0–8.3)

## 2017-11-28 MED ORDER — AMLODIPINE BESYLATE 10 MG PO TABS: ORAL_TABLET | ORAL | 1 refills | Status: DC

## 2017-11-28 MED ORDER — NAPROXEN 500 MG PO TABS: ORAL_TABLET | ORAL | 1 refills | Status: DC

## 2017-11-28 MED ORDER — ATORVASTATIN CALCIUM 40 MG PO TABS: ORAL_TABLET | ORAL | 1 refills | Status: DC

## 2017-11-28 NOTE — Assessment & Plan Note (Signed)
stable overall by history and exam, recent data reviewed with pt, and pt to continue medical treatment as before,  to f/u any worsening symptoms or concerns  

## 2017-11-28 NOTE — Assessment & Plan Note (Signed)

## 2017-11-28 NOTE — Progress Notes (Signed)
Subjective:    Patient ID: Erica Mills, female    DOB: [birth year], 62 y.o.   MRN: [id number]  HPI  Here for wellness and f/u;  Overall doing ok;  Pt denies Chest pain, worsening SOB, DOE, wheezing, orthopnea, PND, worsening LE edema, palpitations, dizziness or syncope.  Pt denies neurological change such as new headache, facial or extremity weakness.  Pt denies polydipsia, polyuria, or low sugar symptoms. Pt states overall good compliance with treatment and medications, good tolerability, and has been trying to follow appropriate diet.  Pt denies worsening depressive symptoms, suicidal ideation or panic. No fever, night sweats, wt loss, loss of appetite, or other constitutional symptoms.  Pt states good ability with ADL's, has low fall risk, home safety reviewed and adequate, no other significant changes in hearing or vision, and only occasionally active with exercise. No low sugars on metformin with wt loss. Lost 20 lbs with better diet, swimming and zoomba., and new relationship.   Wt Readings from Last 3 Encounters:  11/28/17 173 lb (78.5 kg)  05/30/17 193 lb (87.5 kg)  03/07/17 186 lb (84.4 kg)   Past Medical History:  Diagnosis Date  . ACHILLES TENDINITIS 10/06/2009  . ALLERGIC RHINITIS 05/19/2007  . CARPAL TUNNEL SYNDROME, BILATERAL 05/19/2007  . Cyst of breast, right, solitary 07/2012  . ELBOW PAIN, RIGHT 04/21/2008  . Fibroids   . GLUCOSE INTOLERANCE 02/14/2009  . H/O menorrhagia   . HYPERLIPIDEMIA 05/19/2007  . HYPERTENSION 05/19/2007  . HYPERTHYROIDISM 03/10/2009  . Lateral epicondylitis  of elbow 04/21/2008  . SINUSITIS- ACUTE-NOS 05/11/2010  . SMOKER 05/19/2007   Past Surgical History:  Procedure Laterality Date  . CESAREAN SECTION    . TONSILLECTOMY      reports that she has quit smoking. She has never used smokeless tobacco. She reports that she does not drink alcohol or use drugs. family history includes Cancer in her paternal uncle; Diabetes in her father, mother, and  sister; Hypertension in her father and mother. Allergies  Allergen Reactions  . Sulfonamide Derivatives     REACTION: rash   Current Outpatient Medications on File Prior to Visit  Medication Sig Dispense Refill  . aspirin 81 MG tablet Take 81 mg by mouth daily.      . cetirizine (ZYRTEC) 10 MG tablet Take 1 tablet (10 mg total) by mouth daily. 30 tablet 11  . cyclobenzaprine (FLEXERIL) 5 MG tablet Take 1 tablet (5 mg total) by mouth 3 (three) times daily as needed for muscle spasms. 30 tablet 2  . gabapentin (NEURONTIN) 100 MG capsule Take 2 capsules (200 mg total) by mouth at bedtime. 180 capsule 1  . irbesartan (AVAPRO) 150 MG tablet TAKE 1 TABLET BY MOUTH EVERY DAY 90 tablet 2  . metFORMIN (GLUCOPHAGE-XR) 500 MG 24 hr tablet TAKE 1 TABLET (500 MG TOTAL) BY MOUTH DAILY WITH BREAKFAST. 90 tablet 1  . rosuvastatin (CRESTOR) 40 MG tablet TAKE 1 TABLET (40 MG TOTAL) BY MOUTH DAILY. 90 tablet 0  . sertraline (ZOLOFT) 50 MG tablet TAKE 1 TABLET BY MOUTH EVERY DAY 90 tablet 0  . triamcinolone cream (KENALOG) 0.1 % Apply 1 application topically 4 (four) times daily. As needed for rash 30 g 0  . fluticasone (FLONASE) 50 MCG/ACT nasal spray Place 2 sprays into the nose daily. 16 g 5   No current facility-administered medications on file prior to visit.    Review of Systems Constitutional: Negative for other unusual diaphoresis, sweats, appetite or weight changes HENT: Negative for  other worsening hearing loss, ear pain, facial swelling, mouth sores or neck stiffness.   Eyes: Negative for other worsening pain, redness or other visual disturbance.  Respiratory: Negative for other stridor or swelling Cardiovascular: Negative for other palpitations or other chest pain  Gastrointestinal: Negative for worsening diarrhea or loose stools, blood in stool, distention or other pain Genitourinary: Negative for hematuria, flank pain or other change in urine volume.  Musculoskeletal: Negative for myalgias or  other joint swelling.  Skin: Negative for other color change, or other wound or worsening drainage.  Neurological: Negative for other syncope or numbness. Hematological: Negative for other adenopathy or swelling Psychiatric/Behavioral: Negative for hallucinations, other worsening agitation, SI, self-injury, or new decreased concentration All other system neg per pt    Objective:   Physical Exam BP 126/84   Pulse 89   Temp 98.4 F (36.9 C) (Oral)   Ht 5\' 3"  (1.6 m)   Wt 173 lb (78.5 kg)   SpO2 96%   BMI 30.65 kg/m  VS noted,  Constitutional: Pt is oriented to person, place, and time. Appears well-developed and well-nourished, in no significant distress and comfortable Head: Normocephalic and atraumatic  Eyes: Conjunctivae and EOM are normal. Pupils are equal, round, and reactive to light Right Ear: External ear normal without discharge Left Ear: External ear normal without discharge Nose: Nose without discharge or deformity Mouth/Throat: Oropharynx is without other ulcerations and moist  Neck: Normal range of motion. Neck supple. No JVD present. No tracheal deviation present or significant neck LA or mass Cardiovascular: Normal rate, regular rhythm, normal heart sounds and intact distal pulses.  Pulmonary/Chest: WOB normal and breath sounds without rales or wheezing  Abdominal: Soft. Bowel sounds are normal. NT. No HSM  Musculoskeletal: Normal range of motion. Exhibits no edema Lymphadenopathy: Has no other cervical adenopathy.  Neurological: Pt is alert and oriented to person, place, and time. Pt has normal reflexes. No cranial nerve deficit. Motor grossly intact, Gait intact Skin: Skin is warm and dry. No rash noted or new ulcerations Psychiatric:  Has normal mood and affect. Behavior is normal without agitation No other exam findings Lab Results  Component Value Date   WBC 11.3 (H) 11/29/2016   HGB 12.9 11/29/2016   HCT 39.3 11/29/2016   PLT 229.0 11/29/2016   GLUCOSE 95  05/30/2017   CHOL 140 05/30/2017   TRIG 64.0 05/30/2017   HDL 60.50 05/30/2017   LDLDIRECT 153.3 05/19/2007   LDLCALC 67 05/30/2017   ALT 14 05/30/2017   AST 15 05/30/2017   NA 140 05/30/2017   K 3.6 05/30/2017   CL 105 05/30/2017   CREATININE 1.08 05/30/2017   BUN 19 05/30/2017   CO2 28 05/30/2017   TSH 2.44 11/29/2016   HGBA1C 6.7 (H) 05/30/2017   MICROALBUR 30.9 (H) 11/29/2016       Assessment & Plan:

## 2017-11-28 NOTE — Patient Instructions (Signed)

## 2017-12-01 ENCOUNTER — Other Ambulatory Visit: Payer: Self-pay | Admitting: Internal Medicine

## 2017-12-01 DIAGNOSIS — R3129 Other microscopic hematuria: Secondary | ICD-10-CM

## 2017-12-02 ENCOUNTER — Telehealth: Payer: Self-pay

## 2017-12-02 NOTE — Telephone Encounter (Signed)
Called pt, LVM.   CRM created.  

## 2017-12-02 NOTE — Telephone Encounter (Signed)
-----   Message from Biagio Borg, MD sent at 12/01/2017 12:25 PM EDT ----- Left message on MyChart, pt to cont same tx except  The test results show that your current treatment is OK, except the kidneys are slowed quite a bit, now about half the function compared to 2 yrs ago.  The reason for this is not clear, but we should stop the naproxyn, then please return to the lab by Friday June 28 for a repeat lab testing.    Also, there is noted some small blood in the urine, which appears to be a new problem as well.  We normally refer to Urology for further consideration, so you should hear soon.Redmond Baseman to please inform pt, I will do referral and lab order

## 2017-12-05 ENCOUNTER — Other Ambulatory Visit (INDEPENDENT_AMBULATORY_CARE_PROVIDER_SITE_OTHER): Payer: BLUE CROSS/BLUE SHIELD

## 2017-12-05 DIAGNOSIS — R3129 Other microscopic hematuria: Secondary | ICD-10-CM

## 2017-12-05 LAB — BASIC METABOLIC PANEL
BUN: 23 mg/dL (ref 6–23)
CHLORIDE: 109 meq/L (ref 96–112)
CO2: 26 meq/L (ref 19–32)
Calcium: 9.5 mg/dL (ref 8.4–10.5)
Creatinine, Ser: 1.37 mg/dL — ABNORMAL HIGH (ref 0.40–1.20)
GFR: 50.18 mL/min — ABNORMAL LOW (ref >60.00)
Glucose, Bld: 94 mg/dL (ref 70–99)
Potassium: 3.8 mEq/L (ref 3.5–5.1)
Sodium: 142 mEq/L (ref 135–145)

## 2018-01-13 ENCOUNTER — Other Ambulatory Visit: Payer: Self-pay | Admitting: Internal Medicine

## 2018-02-19 ENCOUNTER — Other Ambulatory Visit: Payer: Self-pay | Admitting: Internal Medicine

## 2018-05-19 ENCOUNTER — Other Ambulatory Visit: Payer: Self-pay | Admitting: Internal Medicine

## 2018-05-21 ENCOUNTER — Other Ambulatory Visit: Payer: Self-pay | Admitting: Internal Medicine

## 2018-05-29 ENCOUNTER — Encounter: Payer: Self-pay | Admitting: Internal Medicine

## 2018-05-29 ENCOUNTER — Other Ambulatory Visit (INDEPENDENT_AMBULATORY_CARE_PROVIDER_SITE_OTHER): Payer: BLUE CROSS/BLUE SHIELD

## 2018-05-29 ENCOUNTER — Ambulatory Visit: Payer: BLUE CROSS/BLUE SHIELD | Admitting: Internal Medicine

## 2018-05-29 VITALS — BP 124/84 | HR 81 | Temp 98.4°F | Ht 63.0 in | Wt 171.0 lb

## 2018-05-29 DIAGNOSIS — E119 Type 2 diabetes mellitus without complications: Secondary | ICD-10-CM

## 2018-05-29 DIAGNOSIS — N183 Chronic kidney disease, stage 3 unspecified: Secondary | ICD-10-CM | POA: Insufficient documentation

## 2018-05-29 DIAGNOSIS — E785 Hyperlipidemia, unspecified: Secondary | ICD-10-CM

## 2018-05-29 DIAGNOSIS — I10 Essential (primary) hypertension: Secondary | ICD-10-CM

## 2018-05-29 LAB — LIPID PANEL
CHOL/HDL RATIO: 3
Cholesterol: 159 mg/dL (ref 0–200)
HDL: 61 mg/dL (ref >39.00)
LDL Cholesterol: 84 mg/dL (ref 0–99)
NonHDL: 98.03
TRIGLYCERIDES: 68 mg/dL (ref 0.0–149.0)
VLDL: 13.6 mg/dL (ref 0.0–40.0)

## 2018-05-29 LAB — BASIC METABOLIC PANEL
BUN: 19 mg/dL (ref 6–23)
CO2: 28 meq/L (ref 19–32)
Calcium: 9.5 mg/dL (ref 8.4–10.5)
Chloride: 107 mEq/L (ref 96–112)
Creatinine, Ser: 1.09 mg/dL (ref 0.40–1.20)
GFR: 65.22 mL/min (ref >60.00)
Glucose, Bld: 104 mg/dL — ABNORMAL HIGH (ref 70–99)
Potassium: 4.2 mEq/L (ref 3.5–5.1)
Sodium: 141 mEq/L (ref 135–145)

## 2018-05-29 LAB — HEPATIC FUNCTION PANEL
ALT: 13 U/L (ref 0–35)
AST: 17 U/L (ref 0–37)
Albumin: 4.3 g/dL (ref 3.5–5.2)
Alkaline Phosphatase: 69 U/L (ref 39–117)
BILIRUBIN TOTAL: 0.4 mg/dL (ref 0.2–1.2)
Bilirubin, Direct: 0.1 mg/dL (ref 0.0–0.3)
Total Protein: 7.4 g/dL (ref 6.0–8.3)

## 2018-05-29 LAB — HEMOGLOBIN A1C: Hgb A1c MFr Bld: 5.8 % (ref 4.6–6.5)

## 2018-05-29 MED ORDER — TRIAMCINOLONE ACETONIDE 55 MCG/ACT NA AERO: 2.0000 | INHALATION_SPRAY | Freq: Every day | NASAL | 12 refills | Status: DC

## 2018-05-29 MED ORDER — NEOMYCIN-POLYMYXIN-HC 3.5-10000-1 OT SOLN: 4.0000 [drp] | Freq: Four times a day (QID) | OTIC | 2 refills | Status: AC

## 2018-05-29 NOTE — Patient Instructions (Signed)

## 2018-05-29 NOTE — Progress Notes (Signed)
Subjective:    Patient ID: Erica Mills, female    DOB: [birth year], 62 y.o.   MRN: [id number]  HPI  Here to f/u; overall doing ok,  Pt denies chest pain, increasing sob or doe, wheezing, orthopnea, PND, increased LE swelling, palpitations, dizziness or syncope.  Pt denies new neurological symptoms such as new headache, or facial or extremity weakness or numbness.  Pt denies polydipsia, polyuria, or low sugar episode.  Pt states overall good compliance with meds, mostly trying to follow appropriate diet, with wt overall stable,  but little exercise however. Did have recent viral sounding URI with residual non prod cough and fatigue today.   Past Medical History:  Diagnosis Date  . ACHILLES TENDINITIS 10/06/2009  . ALLERGIC RHINITIS 05/19/2007  . CARPAL TUNNEL SYNDROME, BILATERAL 05/19/2007  . Cyst of breast, right, solitary 07/2012  . ELBOW PAIN, RIGHT 04/21/2008  . Fibroids   . GLUCOSE INTOLERANCE 02/14/2009  . H/O menorrhagia   . HYPERLIPIDEMIA 05/19/2007  . HYPERTENSION 05/19/2007  . HYPERTHYROIDISM 03/10/2009  . Lateral epicondylitis  of elbow 04/21/2008  . SINUSITIS- ACUTE-NOS 05/11/2010  . SMOKER 05/19/2007   Past Surgical History:  Procedure Laterality Date  . CESAREAN SECTION    . TONSILLECTOMY      reports that she has quit smoking. She has never used smokeless tobacco. She reports that she does not drink alcohol or use drugs. family history includes Cancer in her paternal uncle; Diabetes in her father, mother, and sister; Hypertension in her father and mother. Allergies  Allergen Reactions  . Sulfonamide Derivatives     REACTION: rash   Current Outpatient Medications on File Prior to Visit  Medication Sig Dispense Refill  . amLODipine (NORVASC) 10 MG tablet TAKE 1 TABLET (10 MG TOTAL) BY MOUTH DAILY. 90 tablet 1  . aspirin 81 MG tablet Take 81 mg by mouth daily.      Marland Kitchen atorvastatin (LIPITOR) 40 MG tablet TAKE 1 TABLET BY MOUTH EVERY DAY 90 tablet 1  . cetirizine (ZYRTEC) 10 MG  tablet Take 1 tablet (10 mg total) by mouth daily. 30 tablet 11  . cyclobenzaprine (FLEXERIL) 5 MG tablet Take 1 tablet (5 mg total) by mouth 3 (three) times daily as needed for muscle spasms. 30 tablet 2  . gabapentin (NEURONTIN) 100 MG capsule Take 2 capsules (200 mg total) by mouth at bedtime. 180 capsule 1  . irbesartan (AVAPRO) 150 MG tablet TAKE 1 TABLET BY MOUTH EVERY DAY 90 tablet 2  . metFORMIN (GLUCOPHAGE-XR) 500 MG 24 hr tablet TAKE 1 TABLET BY MOUTH EVERY DAY WITH BREAKFAST 90 tablet 1  . rosuvastatin (CRESTOR) 40 MG tablet TAKE 1 TABLET (40 MG TOTAL) BY MOUTH DAILY. 90 tablet 0  . sertraline (ZOLOFT) 50 MG tablet TAKE 1 TABLET BY MOUTH EVERY DAY 90 tablet 0  . triamcinolone cream (KENALOG) 0.1 % Apply 1 application topically 4 (four) times daily. As needed for rash 30 g 0   No current facility-administered medications on file prior to visit.    Review of Systems  Constitutional: Negative for other unusual diaphoresis or sweats HENT: Negative for ear discharge or swelling Eyes: Negative for other worsening visual disturbances Respiratory: Negative for stridor or other swelling  Gastrointestinal: Negative for worsening distension or other blood Genitourinary: Negative for retention or other urinary change Musculoskeletal: Negative for other MSK pain or swelling Skin: Negative for color change or other new lesions Neurological: Negative for worsening tremors and other numbness  Psychiatric/Behavioral: Negative for worsening  agitation or other fatigue All other system neg per pt    Objective:   Physical Exam BP 124/84   Pulse 81   Temp 98.4 F (36.9 C) (Oral)   Ht 5\' 3"  (1.6 m)   Wt 171 lb (77.6 kg)   SpO2 96%   BMI 30.29 kg/m  VS noted,  Constitutional: Pt appears in NAD HENT: Head: NCAT.  Right Ear: External ear normal.  Left Ear: External ear normal.  Eyes: . Pupils are equal, round, and reactive to light. Conjunctivae and EOM are normal Nose: without d/c or  deformity Neck: Neck supple. Gross normal ROM Cardiovascular: Normal rate and regular rhythm.   Pulmonary/Chest: Effort normal and breath sounds without rales or wheezing.  Abd:  Soft, NT, ND, + BS, no organomegaly Neurological: Pt is alert. At baseline orientation, motor grossly intact Skin: Skin is warm. No rashes, other new lesions, no LE edema Psychiatric: Pt behavior is normal without agitation  No other exam findings Lab Results  Component Value Date   WBC 9.3 11/28/2017   HGB 12.9 11/28/2017   HCT 38.9 11/28/2017   PLT 212.0 11/28/2017   GLUCOSE 104 (H) 05/29/2018   CHOL 159 05/29/2018   TRIG 68.0 05/29/2018   HDL 61.00 05/29/2018   LDLDIRECT 153.3 05/19/2007   LDLCALC 84 05/29/2018   ALT 13 05/29/2018   AST 17 05/29/2018   NA 141 05/29/2018   K 4.2 05/29/2018   CL 107 05/29/2018   CREATININE 1.09 05/29/2018   BUN 19 05/29/2018   CO2 28 05/29/2018   TSH 3.64 11/28/2017   HGBA1C 5.8 05/29/2018   MICROALBUR 27.2 (H) 11/28/2017       Assessment & Plan:

## 2018-05-30 NOTE — Assessment & Plan Note (Signed)
stable overall by history and exam, recent data reviewed with pt, and pt to continue medical treatment as before,  to f/u any worsening symptoms or concerns  

## 2018-06-17 ENCOUNTER — Other Ambulatory Visit: Payer: Self-pay | Admitting: Internal Medicine

## 2018-07-10 ENCOUNTER — Other Ambulatory Visit: Payer: Self-pay | Admitting: Internal Medicine

## 2018-08-14 ENCOUNTER — Other Ambulatory Visit: Payer: Self-pay | Admitting: Internal Medicine

## 2018-11-11 ENCOUNTER — Other Ambulatory Visit: Payer: Self-pay | Admitting: Internal Medicine

## 2018-11-14 ENCOUNTER — Other Ambulatory Visit: Payer: Self-pay | Admitting: Internal Medicine

## 2018-12-04 ENCOUNTER — Other Ambulatory Visit: Payer: Self-pay

## 2018-12-04 ENCOUNTER — Other Ambulatory Visit (INDEPENDENT_AMBULATORY_CARE_PROVIDER_SITE_OTHER): Payer: BLUE CROSS/BLUE SHIELD

## 2018-12-04 ENCOUNTER — Encounter: Payer: Self-pay | Admitting: Internal Medicine

## 2018-12-04 ENCOUNTER — Ambulatory Visit (INDEPENDENT_AMBULATORY_CARE_PROVIDER_SITE_OTHER): Payer: BLUE CROSS/BLUE SHIELD | Admitting: Internal Medicine

## 2018-12-04 VITALS — BP 122/84 | HR 94 | Temp 98.6°F | Ht 63.0 in | Wt 188.0 lb

## 2018-12-04 DIAGNOSIS — M545 Low back pain: Secondary | ICD-10-CM | POA: Diagnosis not present

## 2018-12-04 DIAGNOSIS — Z0000 Encounter for general adult medical examination without abnormal findings: Secondary | ICD-10-CM

## 2018-12-04 DIAGNOSIS — E119 Type 2 diabetes mellitus without complications: Secondary | ICD-10-CM

## 2018-12-04 DIAGNOSIS — E611 Iron deficiency: Secondary | ICD-10-CM

## 2018-12-04 DIAGNOSIS — E559 Vitamin D deficiency, unspecified: Secondary | ICD-10-CM | POA: Diagnosis not present

## 2018-12-04 DIAGNOSIS — G8929 Other chronic pain: Secondary | ICD-10-CM

## 2018-12-04 DIAGNOSIS — E538 Deficiency of other specified B group vitamins: Secondary | ICD-10-CM

## 2018-12-04 LAB — HEPATIC FUNCTION PANEL
ALT: 15 U/L (ref 0–35)
AST: 17 U/L (ref 0–37)
Albumin: 4.2 g/dL (ref 3.5–5.2)
Alkaline Phosphatase: 74 U/L (ref 39–117)
Bilirubin, Direct: 0.1 mg/dL (ref 0.0–0.3)
Total Bilirubin: 0.5 mg/dL (ref 0.2–1.2)
Total Protein: 7.3 g/dL (ref 6.0–8.3)

## 2018-12-04 LAB — LIPID PANEL
Cholesterol: 171 mg/dL (ref 0–200)
HDL: 65 mg/dL (ref >39.00)
LDL Cholesterol: 93 mg/dL (ref 0–99)
NonHDL: 106.02
Total CHOL/HDL Ratio: 3
Triglycerides: 65 mg/dL (ref 0.0–149.0)
VLDL: 13 mg/dL (ref 0.0–40.0)

## 2018-12-04 LAB — URINALYSIS, ROUTINE W REFLEX MICROSCOPIC
Bilirubin Urine: NEGATIVE
Hgb urine dipstick: NEGATIVE
Ketones, ur: NEGATIVE
Leukocytes,Ua: NEGATIVE
Nitrite: NEGATIVE
Specific Gravity, Urine: 1.02 (ref 1.000–1.030)
Total Protein, Urine: NEGATIVE
Urine Glucose: NEGATIVE
Urobilinogen, UA: 0.2 (ref 0.0–1.0)
pH: 5.5 (ref 5.0–8.0)

## 2018-12-04 LAB — BASIC METABOLIC PANEL
BUN: 14 mg/dL (ref 6–23)
CO2: 29 mEq/L (ref 19–32)
Calcium: 9.6 mg/dL (ref 8.4–10.5)
Chloride: 108 mEq/L (ref 96–112)
Creatinine, Ser: 0.88 mg/dL (ref 0.40–1.20)
GFR: 78.43 mL/min (ref >60.00)
Glucose, Bld: 78 mg/dL (ref 70–99)
Potassium: 4 mEq/L (ref 3.5–5.1)
Sodium: 143 mEq/L (ref 135–145)

## 2018-12-04 LAB — CBC WITH DIFFERENTIAL/PLATELET
Basophils Absolute: 0.1 10*3/uL (ref 0.0–0.1)
Basophils Relative: 0.9 % (ref 0.0–3.0)
Eosinophils Absolute: 0.2 10*3/uL (ref 0.0–0.7)
Eosinophils Relative: 2.2 % (ref 0.0–5.0)
HCT: 41.1 % (ref 36.0–46.0)
Hemoglobin: 13.5 g/dL (ref 12.0–15.0)
Lymphocytes Relative: 31.3 % (ref 12.0–46.0)
Lymphs Abs: 2.7 10*3/uL (ref 0.7–4.0)
MCHC: 32.8 g/dL (ref 30.0–36.0)
MCV: 88.7 fl (ref 78.0–100.0)
Monocytes Absolute: 0.6 10*3/uL (ref 0.1–1.0)
Monocytes Relative: 7.1 % (ref 3.0–12.0)
Neutro Abs: 5.1 10*3/uL (ref 1.4–7.7)
Neutrophils Relative %: 58.5 % (ref 43.0–77.0)
Platelets: 221 10*3/uL (ref 150.0–400.0)
RBC: 4.63 Mil/uL (ref 3.87–5.11)
RDW: 16.6 % — ABNORMAL HIGH (ref 11.5–15.5)
WBC: 8.7 10*3/uL (ref 4.0–10.5)

## 2018-12-04 LAB — IBC PANEL
Iron: 59 ug/dL (ref 42–145)
Saturation Ratios: 15.2 % — ABNORMAL LOW (ref 20.0–50.0)
Transferrin: 278 mg/dL (ref 212.0–360.0)

## 2018-12-04 LAB — TSH: TSH: 3.51 u[IU]/mL (ref 0.35–4.50)

## 2018-12-04 LAB — MICROALBUMIN / CREATININE URINE RATIO
Creatinine,U: 104.1 mg/dL
Microalb Creat Ratio: 0.7 mg/g (ref 0.0–30.0)
Microalb, Ur: <0.7 mg/dL (ref 0.0–1.9)

## 2018-12-04 LAB — HEMOGLOBIN A1C: Hgb A1c MFr Bld: 6.3 % (ref 4.6–6.5)

## 2018-12-04 LAB — VITAMIN B12: Vitamin B-12: 155 pg/mL — ABNORMAL LOW (ref 211–911)

## 2018-12-04 LAB — VITAMIN D 25 HYDROXY (VIT D DEFICIENCY, FRACTURES): VITD: 36.59 ng/mL (ref 30.00–100.00)

## 2018-12-04 MED ORDER — DICLOFENAC SODIUM 1 % TD GEL: 2.0000 g | Freq: Four times a day (QID) | TRANSDERMAL | 3 refills | Status: AC | PRN

## 2018-12-04 MED ORDER — LIDOCAINE 5 % EX PTCH: 1.0000 | MEDICATED_PATCH | CUTANEOUS | 2 refills | Status: AC

## 2018-12-04 NOTE — Patient Instructions (Signed)
Please take all new medication as prescribed - the pain gel and pain patch as needed  Please continue all other medications as before, and refills have been done if requested.  Please have the pharmacy call with any other refills you may need.  Please continue your efforts at being more active, low cholesterol diet, and weight control.  You are otherwise up to date with prevention measures today.  Please keep your appointments with your specialists as you may have planned  Please go to the LAB in the Basement (turn left off the elevator) for the tests to be done today  You will be contacted by phone if any changes need to be made immediately.  Otherwise, you will receive a letter about your results with an explanation, but please check with MyChart first.  Please remember to sign up for MyChart if you have not done so, as this will be important to you in the future with finding out test results, communicating by private email, and scheduling acute appointments online when needed.  Please return in 6 months, or sooner if needed, with Lab testing done 3-5 days before

## 2018-12-04 NOTE — Progress Notes (Signed)
Subjective:    Patient ID: Erica Mills, female    DOB: Oct 12, 1955, 63 y.o.   MRN: [id number]  HPI  Here for wellness and f/u;  Overall doing ok;  Pt denies Chest pain, worsening SOB, DOE, wheezing, orthopnea, PND, worsening LE edema, palpitations, dizziness or syncope.  Pt denies neurological change such as new headache, facial or extremity weakness.  Pt denies polydipsia, polyuria, or low sugar symptoms. Pt states overall good compliance with treatment and medications, good tolerability, and has been trying to follow appropriate diet.  Pt denies worsening depressive symptoms, suicidal ideation or panic. No fever, night sweats, wt loss, loss of appetite, or other constitutional symptoms.  Pt states good ability with ADL's, has low fall risk, home safety reviewed and adequate, no other significant changes in hearing or vision, and only occasionally active with exercise. Pt continues to have recurring LBP without change in severity, bowel or bladder change, fever, wt loss,  worsening LE pain/numbness/weakness, gait change or falls. Past Medical History:  Diagnosis Date  . ACHILLES TENDINITIS 10/06/2009  . ALLERGIC RHINITIS 05/19/2007  . CARPAL TUNNEL SYNDROME, BILATERAL 05/19/2007  . Cyst of breast, right, solitary 07/2012  . ELBOW PAIN, RIGHT 04/21/2008  . Fibroids   . GLUCOSE INTOLERANCE 02/14/2009  . H/O menorrhagia   . HYPERLIPIDEMIA 05/19/2007  . HYPERTENSION 05/19/2007  . HYPERTHYROIDISM 03/10/2009  . Lateral epicondylitis  of elbow 04/21/2008  . SINUSITIS- ACUTE-NOS 05/11/2010  . SMOKER 05/19/2007   Past Surgical History:  Procedure Laterality Date  . CESAREAN SECTION    . TONSILLECTOMY      reports that she has quit smoking. She has never used smokeless tobacco. She reports that she does not drink alcohol or use drugs. family history includes Cancer in her paternal uncle; Diabetes in her father, mother, and sister; Hypertension in her father and mother. Allergies  Allergen Reactions  .  Sulfonamide Derivatives     REACTION: rash   Current Outpatient Medications on File Prior to Visit  Medication Sig Dispense Refill  . amLODipine (NORVASC) 10 MG tablet TAKE 1 TABLET BY MOUTH EVERY DAY 90 tablet 1  . aspirin 81 MG tablet Take 81 mg by mouth daily.      Marland Kitchen atorvastatin (LIPITOR) 40 MG tablet TAKE 1 TABLET BY MOUTH EVERY DAY 90 tablet 1  . cetirizine (ZYRTEC) 10 MG tablet Take 1 tablet (10 mg total) by mouth daily. 30 tablet 11  . cyclobenzaprine (FLEXERIL) 5 MG tablet Take 1 tablet (5 mg total) by mouth 3 (three) times daily as needed for muscle spasms. 30 tablet 2  . gabapentin (NEURONTIN) 100 MG capsule Take 2 capsules (200 mg total) by mouth at bedtime. 180 capsule 1  . irbesartan (AVAPRO) 150 MG tablet TAKE 1 TABLET BY MOUTH EVERY DAY 90 tablet 2  . metFORMIN (GLUCOPHAGE-XR) 500 MG 24 hr tablet TAKE 1 TABLET BY MOUTH EVERY DAY WITH BREAKFAST 90 tablet 1  . neomycin-polymyxin-hydrocortisone (CORTISPORIN) OTIC solution Place 4 drops into both ears 4 (four) times daily. 10 mL 2  . rosuvastatin (CRESTOR) 40 MG tablet TAKE 1 TABLET (40 MG TOTAL) BY MOUTH DAILY. 90 tablet 0  . sertraline (ZOLOFT) 50 MG tablet TAKE 1 TABLET BY MOUTH EVERY DAY 90 tablet 0  . triamcinolone (NASACORT) 55 MCG/ACT AERO nasal inhaler Place 2 sprays into the nose daily. 1 Inhaler 12  . triamcinolone cream (KENALOG) 0.1 % Apply 1 application topically 4 (four) times daily. As needed for rash 30 g 0  No current facility-administered medications on file prior to visit.    Review of Systems Constitutional: Negative for other unusual diaphoresis, sweats, appetite or weight changes HENT: Negative for other worsening hearing loss, ear pain, facial swelling, mouth sores or neck stiffness.   Eyes: Negative for other worsening pain, redness or other visual disturbance.  Respiratory: Negative for other stridor or swelling Cardiovascular: Negative for other palpitations or other chest pain  Gastrointestinal:  Negative for worsening diarrhea or loose stools, blood in stool, distention or other pain Genitourinary: Negative for hematuria, flank pain or other change in urine volume.  Musculoskeletal: Negative for myalgias or other joint swelling.  Skin: Negative for other color change, or other wound or worsening drainage.  Neurological: Negative for other syncope or numbness. Hematological: Negative for other adenopathy or swelling Psychiatric/Behavioral: Negative for hallucinations, other worsening agitation, SI, self-injury, or new decreased concentration\All other sysetm neg per pt    Objective:   Physical Exam BP 122/84   Pulse 94   Temp 98.6 F (37 C) (Oral)   Ht 5\' 3"  (1.6 m)   Wt 188 lb (85.3 kg)   SpO2 96%   BMI 33.30 kg/m  VS noted,  Constitutional: Pt is oriented to person, place, and time. Appears well-developed and well-nourished, in no significant distress and comfortable Head: Normocephalic and atraumatic  Eyes: Conjunctivae and EOM are normal. Pupils are equal, round, and reactive to light Right Ear: External ear normal without discharge Left Ear: External ear normal without discharge Nose: Nose without discharge or deformity Mouth/Throat: Oropharynx is without other ulcerations and moist  Neck: Normal range of motion. Neck supple. No JVD present. No tracheal deviation present or significant neck LA or mass Cardiovascular: Normal rate, regular rhythm, normal heart sounds and intact distal pulses.   Pulmonary/Chest: WOB normal and breath sounds without rales or wheezing  Abdominal: Soft. Bowel sounds are normal. NT. No HSM  Musculoskeletal: Normal range of motion. Exhibits no edema Lymphadenopathy: Has no other cervical adenopathy.  Neurological: Pt is alert and oriented to person, place, and time. Pt has normal reflexes. No cranial nerve deficit. Motor grossly intact, Gait intact Skin: Skin is warm and dry. No rash noted or new ulcerations Psychiatric:  Has normal mood and  affect. Behavior is normal without agitation No other exam fidnings Lab Results  Component Value Date   WBC 8.7 12/04/2018   HGB 13.5 12/04/2018   HCT 41.1 12/04/2018   PLT 221.0 12/04/2018   GLUCOSE 78 12/04/2018   CHOL 171 12/04/2018   TRIG 65.0 12/04/2018   HDL 65.00 12/04/2018   LDLDIRECT 153.3 05/19/2007   LDLCALC 93 12/04/2018   ALT 15 12/04/2018   AST 17 12/04/2018   NA 143 12/04/2018   K 4.0 12/04/2018   CL 108 12/04/2018   CREATININE 0.88 12/04/2018   BUN 14 12/04/2018   CO2 29 12/04/2018   TSH 3.51 12/04/2018   HGBA1C 6.3 12/04/2018   MICROALBUR <0.7 12/04/2018      Assessment & Plan:

## 2018-12-06 ENCOUNTER — Encounter: Payer: Self-pay | Admitting: Internal Medicine

## 2018-12-06 NOTE — Assessment & Plan Note (Signed)
Mild to mod persistent, to add volt gel prn, also lidoderm asd,  to f/u any worsening symptoms or concerns

## 2018-12-06 NOTE — Assessment & Plan Note (Signed)

## 2018-12-07 ENCOUNTER — Telehealth: Payer: Self-pay

## 2018-12-07 NOTE — Telephone Encounter (Signed)
Noted  

## 2018-12-07 NOTE — Telephone Encounter (Signed)
Called pt, LVM.   CRM created.  

## 2018-12-07 NOTE — Telephone Encounter (Signed)
-----   Message from Biagio Borg, MD sent at 12/04/2018  4:58 PM EDT ----- Left message on MyChart, pt to cont same tx except  The test results show that your current treatment is OK, except the Vitamin B12 level is low.  Please start monthly B12 shots for at least 6 months, then we can see about changing to B12 pills.    Nurah Petrides to please inform pt and help arrange b12 shots

## 2018-12-07 NOTE — Telephone Encounter (Signed)
Patient called back. Gave MD response. Patient expressed understanding.  Appointment has been scheduled for a B12 injection.

## 2018-12-08 ENCOUNTER — Ambulatory Visit (INDEPENDENT_AMBULATORY_CARE_PROVIDER_SITE_OTHER): Payer: BLUE CROSS/BLUE SHIELD

## 2018-12-08 DIAGNOSIS — E538 Deficiency of other specified B group vitamins: Secondary | ICD-10-CM

## 2018-12-08 MED ORDER — CYANOCOBALAMIN 1000 MCG/ML IJ SOLN
1000.0000 ug | Freq: Once | INTRAMUSCULAR | Status: AC
  Administered 2018-12-08: 15:00:00 1000 ug via INTRAMUSCULAR

## 2018-12-08 NOTE — Progress Notes (Signed)
Medical screening examination/treatment/procedure(s) were performed by non-physician practitioner and as supervising physician I was immediately available for consultation/collaboration. I agree with above. James John, MD   

## 2018-12-18 ENCOUNTER — Other Ambulatory Visit: Payer: Self-pay | Admitting: Internal Medicine

## 2019-01-05 ENCOUNTER — Ambulatory Visit: Payer: BLUE CROSS/BLUE SHIELD

## 2019-01-06 ENCOUNTER — Ambulatory Visit (INDEPENDENT_AMBULATORY_CARE_PROVIDER_SITE_OTHER): Payer: BLUE CROSS/BLUE SHIELD

## 2019-01-06 DIAGNOSIS — E538 Deficiency of other specified B group vitamins: Secondary | ICD-10-CM

## 2019-01-06 MED ORDER — CYANOCOBALAMIN 1000 MCG/ML IJ SOLN
1000.0000 ug | Freq: Once | INTRAMUSCULAR | Status: AC
  Administered 2019-01-06: 1000 ug via INTRAMUSCULAR

## 2019-01-06 NOTE — Progress Notes (Signed)
Medical screening examination/treatment/procedure(s) were performed by non-physician practitioner and as supervising physician I was immediately available for consultation/collaboration. I agree with above. James John, MD   

## 2019-01-27 ENCOUNTER — Other Ambulatory Visit: Payer: Self-pay | Admitting: Internal Medicine

## 2019-02-05 ENCOUNTER — Telehealth: Payer: Self-pay | Admitting: Internal Medicine

## 2019-02-05 NOTE — Telephone Encounter (Signed)
This would be $125.00.   Not sure about the other question though.

## 2019-02-05 NOTE — Telephone Encounter (Signed)
Copied from [place] [id number]. Topic: General - Other >> Feb 05, 2019  3:47 PM Antonieta Iba C wrote: Reason for CRM: pt says that she have to change providers because PCP is out of network. Pt says that she get B-12 shots and would like to know how much is the B-12 out of pocket and also pt would like to know what dose over the counter can she take that would help?   CB: [phone]

## 2019-02-08 ENCOUNTER — Ambulatory Visit: Payer: BLUE CROSS/BLUE SHIELD

## 2019-02-08 MED ORDER — VITAMIN B-12 1000 MCG PO TABS: 1000.0000 ug | ORAL_TABLET | Freq: Every day | ORAL | 1 refills | Status: DC

## 2019-02-08 NOTE — Telephone Encounter (Signed)
Pt have been informed

## 2019-02-08 NOTE — Addendum Note (Signed)
Addended by: Biagio Borg on: 02/08/2019 12:52 PM   Modules accepted: Orders

## 2019-02-08 NOTE — Telephone Encounter (Signed)
Ok to try the pills - done erx 

## 2019-04-06 DIAGNOSIS — E119 Type 2 diabetes mellitus without complications: Secondary | ICD-10-CM | POA: Insufficient documentation

## 2019-05-27 ENCOUNTER — Other Ambulatory Visit: Payer: Self-pay | Admitting: Internal Medicine

## 2019-06-02 ENCOUNTER — Ambulatory Visit: Payer: BLUE CROSS/BLUE SHIELD | Admitting: Internal Medicine

## 2019-06-20 ENCOUNTER — Other Ambulatory Visit: Payer: Self-pay | Admitting: Internal Medicine

## 2019-06-21 ENCOUNTER — Other Ambulatory Visit: Payer: Self-pay | Admitting: Internal Medicine

## 2019-06-29 ENCOUNTER — Ambulatory Visit: Payer: 59 | Admitting: Internal Medicine

## 2019-06-29 ENCOUNTER — Encounter: Payer: Self-pay | Admitting: Internal Medicine

## 2019-06-29 ENCOUNTER — Other Ambulatory Visit: Payer: Self-pay

## 2019-06-29 VITALS — BP 118/80 | HR 92 | Temp 98.1°F | Ht 63.0 in | Wt 192.0 lb

## 2019-06-29 DIAGNOSIS — Z23 Encounter for immunization: Secondary | ICD-10-CM

## 2019-06-29 DIAGNOSIS — Z0000 Encounter for general adult medical examination without abnormal findings: Secondary | ICD-10-CM | POA: Diagnosis not present

## 2019-06-29 DIAGNOSIS — E538 Deficiency of other specified B group vitamins: Secondary | ICD-10-CM

## 2019-06-29 DIAGNOSIS — E119 Type 2 diabetes mellitus without complications: Secondary | ICD-10-CM

## 2019-06-29 DIAGNOSIS — Z1231 Encounter for screening mammogram for malignant neoplasm of breast: Secondary | ICD-10-CM | POA: Diagnosis not present

## 2019-06-29 LAB — BASIC METABOLIC PANEL
BUN: 16 mg/dL (ref 6–23)
CO2: 28 mEq/L (ref 19–32)
Calcium: 9.5 mg/dL (ref 8.4–10.5)
Chloride: 106 mEq/L (ref 96–112)
Creatinine, Ser: 0.9 mg/dL (ref 0.40–1.20)
GFR: 76.28 mL/min (ref >60.00)
Glucose, Bld: 78 mg/dL (ref 70–99)
Potassium: 3.8 mEq/L (ref 3.5–5.1)
Sodium: 141 mEq/L (ref 135–145)

## 2019-06-29 LAB — LIPID PANEL
Cholesterol: 171 mg/dL (ref 0–200)
HDL: 65.8 mg/dL (ref >39.00)
LDL Cholesterol: 85 mg/dL (ref 0–99)
NonHDL: 104.7
Total CHOL/HDL Ratio: 3
Triglycerides: 101 mg/dL (ref 0.0–149.0)
VLDL: 20.2 mg/dL (ref 0.0–40.0)

## 2019-06-29 LAB — CBC WITH DIFFERENTIAL/PLATELET
Basophils Absolute: 0.1 10*3/uL (ref 0.0–0.1)
Basophils Relative: 0.8 % (ref 0.0–3.0)
Eosinophils Absolute: 0.2 10*3/uL (ref 0.0–0.7)
Eosinophils Relative: 2.1 % (ref 0.0–5.0)
HCT: 38.3 % (ref 36.0–46.0)
Hemoglobin: 12.5 g/dL (ref 12.0–15.0)
Lymphocytes Relative: 31.3 % (ref 12.0–46.0)
Lymphs Abs: 3 10*3/uL (ref 0.7–4.0)
MCHC: 32.6 g/dL (ref 30.0–36.0)
MCV: 89.4 fl (ref 78.0–100.0)
Monocytes Absolute: 0.8 10*3/uL (ref 0.1–1.0)
Monocytes Relative: 8.2 % (ref 3.0–12.0)
Neutro Abs: 5.4 10*3/uL (ref 1.4–7.7)
Neutrophils Relative %: 57.6 % (ref 43.0–77.0)
Platelets: 226 10*3/uL (ref 150.0–400.0)
RBC: 4.29 Mil/uL (ref 3.87–5.11)
RDW: 16.4 % — ABNORMAL HIGH (ref 11.5–15.5)
WBC: 9.4 10*3/uL (ref 4.0–10.5)

## 2019-06-29 LAB — VITAMIN B12: Vitamin B-12: >1500 pg/mL — ABNORMAL HIGH (ref 211–911)

## 2019-06-29 LAB — HEMOGLOBIN A1C: Hgb A1c MFr Bld: 6.6 % — ABNORMAL HIGH (ref 4.6–6.5)

## 2019-06-29 LAB — MICROALBUMIN / CREATININE URINE RATIO
Creatinine,U: 180 mg/dL
Microalb Creat Ratio: 0.7 mg/g (ref 0.0–30.0)
Microalb, Ur: 1.3 mg/dL (ref 0.0–1.9)

## 2019-06-29 LAB — HEPATIC FUNCTION PANEL
ALT: 21 U/L (ref 0–35)
AST: 20 U/L (ref 0–37)
Albumin: 4.1 g/dL (ref 3.5–5.2)
Alkaline Phosphatase: 74 U/L (ref 39–117)
Bilirubin, Direct: 0.1 mg/dL (ref 0.0–0.3)
Total Bilirubin: 0.4 mg/dL (ref 0.2–1.2)
Total Protein: 7.1 g/dL (ref 6.0–8.3)

## 2019-06-29 LAB — URINALYSIS, ROUTINE W REFLEX MICROSCOPIC
Bilirubin Urine: NEGATIVE
Hgb urine dipstick: NEGATIVE
Ketones, ur: NEGATIVE
Leukocytes,Ua: NEGATIVE
Nitrite: NEGATIVE
RBC / HPF: NONE SEEN (ref 0–?)
Specific Gravity, Urine: >=1.03 — AB (ref 1.000–1.030)
Total Protein, Urine: NEGATIVE
Urine Glucose: NEGATIVE
Urobilinogen, UA: 0.2 (ref 0.0–1.0)
pH: 5.5 (ref 5.0–8.0)

## 2019-06-29 LAB — TSH: TSH: 6.93 u[IU]/mL — ABNORMAL HIGH (ref 0.35–4.50)

## 2019-06-29 NOTE — Progress Notes (Signed)
Subjective:    Patient ID: Erica Mills, female    DOB: March 25, 1956, 64 y.o.   MRN: [id number]  HPI  Here for wellness and f/u;  Overall doing ok;  Pt denies Chest pain, worsening SOB, DOE, wheezing, orthopnea, PND, worsening LE edema, palpitations, dizziness or syncope.  Pt denies neurological change such as new headache, facial or extremity weakness.  Pt denies polydipsia, polyuria, or low sugar symptoms. Pt states overall good compliance with treatment and medications, good tolerability, and has been trying to follow appropriate diet.  Pt denies worsening depressive symptoms, suicidal ideation or panic. No fever, night sweats, wt loss, loss of appetite, or other constitutional symptoms.  Pt states good ability with ADL's, has low fall risk, home safety reviewed and adequate, no other significant changes in hearing or vision, and only occasionally active with exercise.  Gained wt significant with pandemic and no swimming and gym classes; Wt Readings from Last 3 Encounters:  06/29/19 192 lb (87.1 kg)  12/04/18 188 lb (85.3 kg)  05/29/18 171 lb (77.6 kg)   Past Medical History:  Diagnosis Date  . ACHILLES TENDINITIS 10/06/2009  . ALLERGIC RHINITIS 05/19/2007  . CARPAL TUNNEL SYNDROME, BILATERAL 05/19/2007  . Cyst of breast, right, solitary 07/2012  . ELBOW PAIN, RIGHT 04/21/2008  . Fibroids   . GLUCOSE INTOLERANCE 02/14/2009  . H/O menorrhagia   . HYPERLIPIDEMIA 05/19/2007  . HYPERTENSION 05/19/2007  . HYPERTHYROIDISM 03/10/2009  . Lateral epicondylitis  of elbow 04/21/2008  . SINUSITIS- ACUTE-NOS 05/11/2010  . SMOKER 05/19/2007   Past Surgical History:  Procedure Laterality Date  . CESAREAN SECTION    . TONSILLECTOMY      reports that she has quit smoking. She has never used smokeless tobacco. She reports that she does not drink alcohol or use drugs. family history includes Cancer in her paternal uncle; Diabetes in her father, mother, and sister; Hypertension in her father and  mother. Allergies  Allergen Reactions  . Sulfonamide Derivatives     REACTION: rash   Current Outpatient Medications on File Prior to Visit  Medication Sig Dispense Refill  . amLODipine (NORVASC) 10 MG tablet TAKE 1 TABLET BY MOUTH EVERY DAY 90 tablet 1  . aspirin 81 MG tablet Take 81 mg by mouth daily.      Marland Kitchen atorvastatin (LIPITOR) 40 MG tablet TAKE 1 TABLET BY MOUTH EVERY DAY 90 tablet 1  . cetirizine (ZYRTEC) 10 MG tablet Take 1 tablet (10 mg total) by mouth daily. 30 tablet 11  . cyclobenzaprine (FLEXERIL) 5 MG tablet Take 1 tablet (5 mg total) by mouth 3 (three) times daily as needed for muscle spasms. 30 tablet 2  . diclofenac sodium (VOLTAREN) 1 % GEL Apply 2 g topically 4 (four) times daily as needed. 200 g 3  . gabapentin (NEURONTIN) 100 MG capsule Take 2 capsules (200 mg total) by mouth at bedtime. 180 capsule 1  . irbesartan (AVAPRO) 150 MG tablet TAKE 1 TABLET BY MOUTH EVERY DAY 90 tablet 2  . lidocaine (LIDODERM) 5 % Place 1 patch onto the skin daily. Remove & Discard patch within 12 hours or as directed by MD 40 patch 2  . metFORMIN (GLUCOPHAGE-XR) 500 MG 24 hr tablet TAKE 1 TABLET BY MOUTH EVERY DAY WITH BREAKFAST 90 tablet 1  . neomycin-polymyxin-hydrocortisone (CORTISPORIN) OTIC solution Place 4 drops into both ears 4 (four) times daily. 10 mL 2  . rosuvastatin (CRESTOR) 40 MG tablet TAKE 1 TABLET (40 MG TOTAL) BY MOUTH DAILY. [place]  tablet 0  . sertraline (ZOLOFT) 50 MG tablet TAKE 1 TABLET BY MOUTH EVERY DAY 90 tablet 1  . triamcinolone (NASACORT) 55 MCG/ACT AERO nasal inhaler Place 2 sprays into the nose daily. 1 Inhaler 12  . triamcinolone cream (KENALOG) 0.1 % Apply 1 application topically 4 (four) times daily. As needed for rash 30 g 0  . vitamin B-12 (CYANOCOBALAMIN) 1000 MCG tablet Take 1 tablet (1,000 mcg total) by mouth daily. 90 tablet 1   No current facility-administered medications on file prior to visit.   Review of Systems All otherwise neg per pt      Objective:   Physical Exam VS noted,  Constitutional: Pt appears in NAD HENT: Head: NCAT.  Right Ear: External ear normal.  Left Ear: External ear normal.  Eyes: . Pupils are equal, round, and reactive to light. Conjunctivae and EOM are normal Nose: without d/c or deformity Neck: Neck supple. Gross normal ROM Cardiovascular: Normal rate and regular rhythm.   Pulmonary/Chest: Effort normal and breath sounds without rales or wheezing.  Abd:  Soft, NT, ND, + BS, no organomegaly Neurological: Pt is alert. At baseline orientation, motor grossly intact Skin: Skin is warm. No rashes, other new lesions, no LE edema Psychiatric: Pt behavior is normal without agitation  All otherwise neg per pt Lab Results  Component Value Date   WBC 9.4 06/29/2019   HGB 12.5 06/29/2019   HCT 38.3 06/29/2019   PLT 226.0 06/29/2019   GLUCOSE 78 06/29/2019   CHOL 171 06/29/2019   TRIG 101.0 06/29/2019   HDL 65.80 06/29/2019   LDLDIRECT 153.3 05/19/2007   LDLCALC 85 06/29/2019   ALT 21 06/29/2019   AST 20 06/29/2019   NA 141 06/29/2019   K 3.8 06/29/2019   CL 106 06/29/2019   CREATININE 0.90 06/29/2019   BUN 16 06/29/2019   CO2 28 06/29/2019   TSH 6.93 (H) 06/29/2019   HGBA1C 6.6 (H) 06/29/2019   MICROALBUR 1.3 06/29/2019       Assessment & Plan:

## 2019-06-29 NOTE — Assessment & Plan Note (Signed)
stable overall by history and exam, recent data reviewed with pt, and pt to continue medical treatment as before,  to f/u any worsening symptoms or concerns, for optho referral

## 2019-06-29 NOTE — Patient Instructions (Addendum)
You had the Tdap tetanus shot today  You will be contacted regarding the referral for: mammogram, and eye doctor  For the COVID vaccine - please call [phone] (option 2) for an appt, but if unable to get through quickly, please go to the web page:   [email or web address] to be placed on the wait list (an email and phone number are required)  For COVID testing:  If you/your practice has a patient that needs an appointment, please have the patient text "COVID" to [phone], OR they can log on to [email or web address] to easily make an on-line appointment.   If you have a patient who does not have access to a smart phone or PC, you or the patient can call [phone] to get assistance.  Please continue all other medications as before, and refills have been done if requested.  Please have the pharmacy call with any other refills you may need.  Please continue your efforts at being more active, low cholesterol diet, and weight control.  You are otherwise up to date with prevention measures today.  Please keep your appointments with your specialists as you may have planned  Please go to the LAB at the blood drawing area for the tests to be done today  You will be contacted by phone if any changes need to be made immediately.  Otherwise, you will receive a letter about your results with an explanation, but please check with MyChart first.  Please remember to sign up for MyChart if you have not done so, as this will be important to you in the future with finding out test results, communicating by private email, and scheduling acute appointments online when needed.  Please make an Appointment to return in 6 months, or sooner if needed, also with Lab Appointment for testing done 3-5 days before at the [facility] (so this is for TWO appointments - please see the scheduling desk as you leave)

## 2019-06-29 NOTE — Assessment & Plan Note (Signed)

## 2019-06-30 ENCOUNTER — Encounter: Payer: Self-pay | Admitting: Internal Medicine

## 2019-07-06 ENCOUNTER — Encounter: Payer: Self-pay | Admitting: Internal Medicine

## 2019-07-16 ENCOUNTER — Ambulatory Visit
Admission: RE | Admit: 2019-07-16 | Discharge: 2019-07-16 | Disposition: A | Discharge status: Home / Self Care | Payer: 59 | Source: Ambulatory Visit | Attending: Internal Medicine | Admitting: Internal Medicine

## 2019-07-16 ENCOUNTER — Other Ambulatory Visit: Payer: Self-pay

## 2019-07-16 DIAGNOSIS — Z1231 Encounter for screening mammogram for malignant neoplasm of breast: Secondary | ICD-10-CM

## 2019-07-26 ENCOUNTER — Encounter: Payer: Self-pay | Admitting: Internal Medicine

## 2019-08-27 LAB — HM DIABETES EYE EXAM

## 2019-09-12 ENCOUNTER — Other Ambulatory Visit: Payer: Self-pay | Admitting: Internal Medicine

## 2019-09-13 NOTE — Telephone Encounter (Signed)
Please refill as per office routine med refill policy (all routine meds refilled for 3 mo or monthly per pt preference up to one year from last visit, then month to month grace period for 3 mo, then further med refills will have to be denied)  

## 2019-10-18 ENCOUNTER — Other Ambulatory Visit: Payer: Self-pay | Admitting: Internal Medicine

## 2019-12-22 ENCOUNTER — Other Ambulatory Visit (INDEPENDENT_AMBULATORY_CARE_PROVIDER_SITE_OTHER): Payer: 59

## 2019-12-22 ENCOUNTER — Other Ambulatory Visit: Payer: Self-pay

## 2019-12-22 DIAGNOSIS — E119 Type 2 diabetes mellitus without complications: Secondary | ICD-10-CM | POA: Diagnosis not present

## 2019-12-22 LAB — LIPID PANEL
Cholesterol: 172 mg/dL (ref 0–200)
HDL: 53 mg/dL (ref >39.00)
LDL Cholesterol: 99 mg/dL (ref 0–99)
NonHDL: 119.49
Total CHOL/HDL Ratio: 3
Triglycerides: 103 mg/dL (ref 0.0–149.0)
VLDL: 20.6 mg/dL (ref 0.0–40.0)

## 2019-12-22 LAB — HEPATIC FUNCTION PANEL
ALT: 18 U/L (ref 0–35)
AST: 17 U/L (ref 0–37)
Albumin: 4.2 g/dL (ref 3.5–5.2)
Alkaline Phosphatase: 73 U/L (ref 39–117)
Bilirubin, Direct: 0.1 mg/dL (ref 0.0–0.3)
Total Bilirubin: 0.4 mg/dL (ref 0.2–1.2)
Total Protein: 7.1 g/dL (ref 6.0–8.3)

## 2019-12-22 LAB — BASIC METABOLIC PANEL
BUN: 17 mg/dL (ref 6–23)
CO2: 28 mEq/L (ref 19–32)
Calcium: 9.5 mg/dL (ref 8.4–10.5)
Chloride: 106 mEq/L (ref 96–112)
Creatinine, Ser: 0.92 mg/dL (ref 0.40–1.20)
GFR: 74.26 mL/min (ref >60.00)
Glucose, Bld: 134 mg/dL — ABNORMAL HIGH (ref 70–99)
Potassium: 3.9 mEq/L (ref 3.5–5.1)
Sodium: 141 mEq/L (ref 135–145)

## 2019-12-22 LAB — HEMOGLOBIN A1C: Hgb A1c MFr Bld: 6.6 % — ABNORMAL HIGH (ref 4.6–6.5)

## 2019-12-27 ENCOUNTER — Other Ambulatory Visit: Payer: Self-pay

## 2019-12-27 ENCOUNTER — Ambulatory Visit: Payer: 59 | Admitting: Internal Medicine

## 2019-12-27 ENCOUNTER — Encounter: Payer: Self-pay | Admitting: Internal Medicine

## 2019-12-27 VITALS — BP 120/82 | HR 86 | Temp 98.0°F | Ht 63.0 in | Wt 187.0 lb

## 2019-12-27 DIAGNOSIS — N183 Chronic kidney disease, stage 3 unspecified: Secondary | ICD-10-CM | POA: Diagnosis not present

## 2019-12-27 DIAGNOSIS — I10 Essential (primary) hypertension: Secondary | ICD-10-CM | POA: Diagnosis not present

## 2019-12-27 DIAGNOSIS — E785 Hyperlipidemia, unspecified: Secondary | ICD-10-CM | POA: Diagnosis not present

## 2019-12-27 DIAGNOSIS — E119 Type 2 diabetes mellitus without complications: Secondary | ICD-10-CM

## 2019-12-27 MED ORDER — ROSUVASTATIN CALCIUM 40 MG PO TABS: 40.0000 mg | ORAL_TABLET | Freq: Every day | ORAL | 3 refills | Status: DC

## 2019-12-27 NOTE — Assessment & Plan Note (Signed)
stable overall by history and exam, recent data reviewed with pt, and pt to continue medical treatment as before,  to f/u any worsening symptoms or concerns  

## 2019-12-27 NOTE — Assessment & Plan Note (Addendum)

## 2019-12-27 NOTE — Assessment & Plan Note (Signed)
Mild uncontrolled, goal ldl < 70, for increased lipitor 80 qd

## 2019-12-27 NOTE — Progress Notes (Signed)
Subjective:    Patient ID: Erica Mills, female    DOB: 1956/04/29, 64 y.o.   MRN: [id number]  HPI  Here to f/u; overall doing ok,  Pt denies chest pain, increasing sob or doe, wheezing, orthopnea, PND, palpitations, dizziness or syncope, but does have very small trace distal ankle swelling on the high dose amlodipine she has not noticed.  Pt denies new neurological symptoms such as new headache, or facial or extremity weakness or numbness.  Pt denies polydipsia, polyuria, or low sugar episode.  Pt states overall good compliance with meds, mostly trying to follow appropriate diet, with wt overall stable,  Past Medical History:  Diagnosis Date  . ACHILLES TENDINITIS 10/06/2009  . ALLERGIC RHINITIS 05/19/2007  . CARPAL TUNNEL SYNDROME, BILATERAL 05/19/2007  . Cyst of breast, right, solitary 07/2012  . ELBOW PAIN, RIGHT 04/21/2008  . Fibroids   . GLUCOSE INTOLERANCE 02/14/2009  . H/O menorrhagia   . HYPERLIPIDEMIA 05/19/2007  . HYPERTENSION 05/19/2007  . HYPERTHYROIDISM 03/10/2009  . Lateral epicondylitis  of elbow 04/21/2008  . SINUSITIS- ACUTE-NOS 05/11/2010  . SMOKER 05/19/2007   Past Surgical History:  Procedure Laterality Date  . CESAREAN SECTION    . TONSILLECTOMY      reports that she has quit smoking. She has never used smokeless tobacco. She reports that she does not drink alcohol and does not use drugs. family history includes Cancer in her paternal uncle; Diabetes in her father, mother, and sister; Hypertension in her father and mother. Allergies  Allergen Reactions  . Sulfonamide Derivatives     REACTION: rash   Current Outpatient Medications on File Prior to Visit  Medication Sig Dispense Refill  . amLODipine (NORVASC) 10 MG tablet TAKE 1 TABLET BY MOUTH EVERY DAY 90 tablet 1  . aspirin 81 MG tablet Take 81 mg by mouth daily.      Marland Kitchen atorvastatin (LIPITOR) 40 MG tablet TAKE 1 TABLET BY MOUTH EVERY DAY 90 tablet 2  . cetirizine (ZYRTEC) 10 MG tablet Take 1 tablet (10 mg total)  by mouth daily. 30 tablet 11  . cyclobenzaprine (FLEXERIL) 5 MG tablet Take 1 tablet (5 mg total) by mouth 3 (three) times daily as needed for muscle spasms. 30 tablet 2  . diclofenac sodium (VOLTAREN) 1 % GEL Apply 2 g topically 4 (four) times daily as needed. 200 g 3  . gabapentin (NEURONTIN) 100 MG capsule Take 2 capsules (200 mg total) by mouth at bedtime. 180 capsule 1  . irbesartan (AVAPRO) 150 MG tablet TAKE 1 TABLET BY MOUTH EVERY DAY 90 tablet 2  . lidocaine (LIDODERM) 5 % Place 1 patch onto the skin daily. Remove & Discard patch within 12 hours or as directed by MD 40 patch 2  . metFORMIN (GLUCOPHAGE-XR) 500 MG 24 hr tablet TAKE 1 TABLET BY MOUTH EVERY DAY WITH BREAKFAST 90 tablet 1  . neomycin-polymyxin-hydrocortisone (CORTISPORIN) OTIC solution Place 4 drops into both ears 4 (four) times daily. 10 mL 2  . rosuvastatin (CRESTOR) 40 MG tablet TAKE 1 TABLET (40 MG TOTAL) BY MOUTH DAILY. 90 tablet 0  . sertraline (ZOLOFT) 50 MG tablet TAKE 1 TABLET BY MOUTH EVERY DAY 90 tablet 1  . triamcinolone (NASACORT) 55 MCG/ACT AERO nasal inhaler Place 2 sprays into the nose daily. 1 Inhaler 12  . triamcinolone cream (KENALOG) 0.1 % Apply 1 application topically 4 (four) times daily. As needed for rash 30 g 0  . vitamin B-12 (CYANOCOBALAMIN) 1000 MCG tablet Take 1 tablet (1,000  mcg total) by mouth daily. 90 tablet 1   No current facility-administered medications on file prior to visit.   Review of Systems All otherwise neg per pt     Objective:   Physical Exam BP 120/82 (BP Location: Left Arm, Patient Position: Sitting, Cuff Size: Large)   Pulse 86   Temp 98 F (36.7 C) (Oral)   Ht 5\' 3"  (1.6 m)   Wt 187 lb (84.8 kg)   SpO2 96%   BMI 33.13 kg/m  VS noted,  Constitutional: Pt appears in NAD HENT: Head: NCAT.  Right Ear: External ear normal.  Left Ear: External ear normal.  Eyes: . Pupils are equal, round, and reactive to light. Conjunctivae and EOM are normal Nose: without d/c or  deformity Neck: Neck supple. Gross normal ROM Cardiovascular: Normal rate and regular rhythm.   Pulmonary/Chest: Effort normal and breath sounds without rales or wheezing.  Abd:  Soft, NT, ND, + BS, no organomegaly Neurological: Pt is alert. At baseline orientation, motor grossly intact Skin: Skin is warm. No rashes, other new lesions, trace bilat LE edema Psychiatric: Pt behavior is normal without agitation  All otherwise neg per pt Lab Results  Component Value Date   WBC 9.4 06/29/2019   HGB 12.5 06/29/2019   HCT 38.3 06/29/2019   PLT 226.0 06/29/2019   GLUCOSE 134 (H) 12/22/2019   CHOL 172 12/22/2019   TRIG 103.0 12/22/2019   HDL 53.00 12/22/2019   LDLDIRECT 153.3 05/19/2007   LDLCALC 99 12/22/2019   ALT 18 12/22/2019   AST 17 12/22/2019   NA 141 12/22/2019   K 3.9 12/22/2019   CL 106 12/22/2019   CREATININE 0.92 12/22/2019   BUN 17 12/22/2019   CO2 28 12/22/2019   TSH 6.93 (H) 06/29/2019   HGBA1C 6.6 (H) 12/22/2019   MICROALBUR 1.3 06/29/2019        Assessment & Plan:

## 2019-12-27 NOTE — Patient Instructions (Signed)
Ok to increase the lipitor to 80 mg per day  Please continue all other medications as before, and refills have been done if requested.  Please have the pharmacy call with any other refills you may need.  Please continue your efforts at being more active, low cholesterol diet, and weight control.  You are otherwise up to date with prevention measures today.  Please keep your appointments with your specialists as you may have planned  Please make an Appointment to return in 6 months, or sooner if needed

## 2019-12-27 NOTE — Addendum Note (Signed)
Addended by: Biagio Borg on: 12/27/2019 08:00 PM   Modules accepted: Orders

## 2020-01-17 ENCOUNTER — Telehealth: Payer: Self-pay

## 2020-01-17 NOTE — Telephone Encounter (Signed)
New message   The patient calling with COVID vaccine information - moderna   First  3.1.2021  Second 3.29.2021

## 2020-02-13 ENCOUNTER — Other Ambulatory Visit: Payer: Self-pay | Admitting: Internal Medicine

## 2020-02-13 NOTE — Telephone Encounter (Signed)
Please refill as per office routine med refill policy (all routine meds refilled for 3 mo or monthly per pt preference up to one year from last visit, then month to month grace period for 3 mo, then further med refills will have to be denied)  

## 2020-03-21 ENCOUNTER — Other Ambulatory Visit: Payer: Self-pay | Admitting: Internal Medicine

## 2020-06-06 ENCOUNTER — Other Ambulatory Visit: Payer: Self-pay | Admitting: Internal Medicine

## 2020-06-07 NOTE — Telephone Encounter (Signed)
Please refill as per office routine med refill policy (all routine meds refilled for 3 mo or monthly per pt preference up to one year from last visit, then month to month grace period for 3 mo, then further med refills will have to be denied)  

## 2020-06-19 ENCOUNTER — Telehealth: Payer: Self-pay | Admitting: Internal Medicine

## 2020-06-19 NOTE — Telephone Encounter (Signed)
   Patient calling to report she tested positive for COVID 01/07. Seeking advice sore throat and runny nose

## 2020-06-20 NOTE — Telephone Encounter (Signed)
° ° °  Please return call to patient °

## 2020-06-20 NOTE — Telephone Encounter (Signed)
Please advise.   Thanks. Dm/cma  

## 2020-06-20 NOTE — Telephone Encounter (Signed)
lft VM to rtn call. Dm/cma  

## 2020-06-20 NOTE — Telephone Encounter (Signed)
Spoke to patient, she is agreeable to the OTC meds to take to help ease the symptoms.  She has had all 3 covid vaccines.  She declines the IV treatment plan.  Dm/cma

## 2020-06-20 NOTE — Telephone Encounter (Signed)
Ok for [employer] otc bid prn, vit d, vit d, and zinc  Ok to refer to monoclonal ab hotline if not vaccinated, or to the same place for IV remdesivir if has been vaccinated  - but only if symptoms less than 7 days

## 2020-06-22 ENCOUNTER — Other Ambulatory Visit: Payer: 59

## 2020-06-28 ENCOUNTER — Ambulatory Visit: Payer: 59 | Admitting: Internal Medicine

## 2020-06-30 ENCOUNTER — Telehealth: Payer: Self-pay | Admitting: Internal Medicine

## 2020-06-30 ENCOUNTER — Ambulatory Visit: Payer: 59 | Admitting: Internal Medicine

## 2020-06-30 NOTE — Telephone Encounter (Signed)
2/4 appt with Dr. Jenny Reichmann, 1/28 lab appt-need orders please.

## 2020-07-02 ENCOUNTER — Other Ambulatory Visit: Payer: Self-pay | Admitting: Internal Medicine

## 2020-07-02 DIAGNOSIS — E559 Vitamin D deficiency, unspecified: Secondary | ICD-10-CM

## 2020-07-02 DIAGNOSIS — Z0000 Encounter for general adult medical examination without abnormal findings: Secondary | ICD-10-CM

## 2020-07-02 DIAGNOSIS — E538 Deficiency of other specified B group vitamins: Secondary | ICD-10-CM

## 2020-07-02 DIAGNOSIS — E119 Type 2 diabetes mellitus without complications: Secondary | ICD-10-CM

## 2020-07-02 NOTE — Telephone Encounter (Signed)
Ok done

## 2020-07-07 ENCOUNTER — Other Ambulatory Visit (INDEPENDENT_AMBULATORY_CARE_PROVIDER_SITE_OTHER): Payer: 59

## 2020-07-07 DIAGNOSIS — E119 Type 2 diabetes mellitus without complications: Secondary | ICD-10-CM | POA: Diagnosis not present

## 2020-07-07 DIAGNOSIS — E538 Deficiency of other specified B group vitamins: Secondary | ICD-10-CM | POA: Diagnosis not present

## 2020-07-07 DIAGNOSIS — E559 Vitamin D deficiency, unspecified: Secondary | ICD-10-CM

## 2020-07-07 LAB — CBC WITH DIFFERENTIAL/PLATELET
Basophils Absolute: 0.1 10*3/uL (ref 0.0–0.1)
Basophils Relative: 0.8 % (ref 0.0–3.0)
Eosinophils Absolute: 0.3 10*3/uL (ref 0.0–0.7)
Eosinophils Relative: 3 % (ref 0.0–5.0)
HCT: 39.6 % (ref 36.0–46.0)
Hemoglobin: 13.3 g/dL (ref 12.0–15.0)
Lymphocytes Relative: 33.2 % (ref 12.0–46.0)
Lymphs Abs: 3.5 10*3/uL (ref 0.7–4.0)
MCHC: 33.5 g/dL (ref 30.0–36.0)
MCV: 86.6 fl (ref 78.0–100.0)
Monocytes Absolute: 0.8 10*3/uL (ref 0.1–1.0)
Monocytes Relative: 7.6 % (ref 3.0–12.0)
Neutro Abs: 5.8 10*3/uL (ref 1.4–7.7)
Neutrophils Relative %: 55.4 % (ref 43.0–77.0)
Platelets: 227 10*3/uL (ref 150.0–400.0)
RBC: 4.57 Mil/uL (ref 3.87–5.11)
RDW: 17.2 % — ABNORMAL HIGH (ref 11.5–15.5)
WBC: 10.5 10*3/uL (ref 4.0–10.5)

## 2020-07-07 LAB — URINALYSIS, ROUTINE W REFLEX MICROSCOPIC
Bilirubin Urine: NEGATIVE
Ketones, ur: NEGATIVE
Leukocytes,Ua: NEGATIVE
Nitrite: NEGATIVE
Specific Gravity, Urine: 1.025 (ref 1.000–1.030)
Total Protein, Urine: 100 — AB
Urine Glucose: NEGATIVE
Urobilinogen, UA: 0.2 (ref 0.0–1.0)
WBC, UA: NONE SEEN (ref 0–?)
pH: 6 (ref 5.0–8.0)

## 2020-07-07 LAB — BASIC METABOLIC PANEL
BUN: 24 mg/dL — ABNORMAL HIGH (ref 6–23)
CO2: 25 mEq/L (ref 19–32)
Calcium: 9.5 mg/dL (ref 8.4–10.5)
Chloride: 109 mEq/L (ref 96–112)
Creatinine, Ser: 1.38 mg/dL — ABNORMAL HIGH (ref 0.40–1.20)
GFR: 40.31 mL/min — ABNORMAL LOW (ref >60.00)
Glucose, Bld: 98 mg/dL (ref 70–99)
Potassium: 3.9 mEq/L (ref 3.5–5.1)
Sodium: 141 mEq/L (ref 135–145)

## 2020-07-07 LAB — MICROALBUMIN / CREATININE URINE RATIO
Creatinine,U: 94.5 mg/dL
Microalb Creat Ratio: 15 mg/g (ref 0.0–30.0)
Microalb, Ur: 14.2 mg/dL — ABNORMAL HIGH (ref 0.0–1.9)

## 2020-07-07 LAB — LIPID PANEL
Cholesterol: 144 mg/dL (ref 0–200)
HDL: 56.3 mg/dL (ref >39.00)
LDL Cholesterol: 71 mg/dL (ref 0–99)
NonHDL: 87.48
Total CHOL/HDL Ratio: 3
Triglycerides: 81 mg/dL (ref 0.0–149.0)
VLDL: 16.2 mg/dL (ref 0.0–40.0)

## 2020-07-07 LAB — HEPATIC FUNCTION PANEL
ALT: 41 U/L — ABNORMAL HIGH (ref 0–35)
AST: 48 U/L — ABNORMAL HIGH (ref 0–37)
Albumin: 4.3 g/dL (ref 3.5–5.2)
Alkaline Phosphatase: 80 U/L (ref 39–117)
Bilirubin, Direct: 0.1 mg/dL (ref 0.0–0.3)
Total Bilirubin: 0.4 mg/dL (ref 0.2–1.2)
Total Protein: 7.7 g/dL (ref 6.0–8.3)

## 2020-07-07 LAB — VITAMIN B12: Vitamin B-12: >1526 pg/mL — ABNORMAL HIGH (ref 211–911)

## 2020-07-07 LAB — TSH: TSH: 11.03 u[IU]/mL — ABNORMAL HIGH (ref 0.35–4.50)

## 2020-07-07 LAB — HEMOGLOBIN A1C: Hgb A1c MFr Bld: 6.7 % — ABNORMAL HIGH (ref 4.6–6.5)

## 2020-07-07 LAB — VITAMIN D 25 HYDROXY (VIT D DEFICIENCY, FRACTURES): VITD: 56.64 ng/mL (ref 30.00–100.00)

## 2020-07-14 ENCOUNTER — Encounter: Payer: Self-pay | Admitting: Internal Medicine

## 2020-07-14 ENCOUNTER — Ambulatory Visit: Payer: 59 | Admitting: Internal Medicine

## 2020-07-14 ENCOUNTER — Ambulatory Visit (INDEPENDENT_AMBULATORY_CARE_PROVIDER_SITE_OTHER): Payer: 59 | Admitting: Internal Medicine

## 2020-07-14 ENCOUNTER — Other Ambulatory Visit: Payer: Self-pay

## 2020-07-14 VITALS — BP 120/76 | HR 78 | Temp 98.3°F | Ht 63.0 in | Wt 183.0 lb

## 2020-07-14 DIAGNOSIS — I10 Essential (primary) hypertension: Secondary | ICD-10-CM

## 2020-07-14 DIAGNOSIS — Z23 Encounter for immunization: Secondary | ICD-10-CM

## 2020-07-14 DIAGNOSIS — R945 Abnormal results of liver function studies: Secondary | ICD-10-CM

## 2020-07-14 DIAGNOSIS — E039 Hypothyroidism, unspecified: Secondary | ICD-10-CM | POA: Insufficient documentation

## 2020-07-14 DIAGNOSIS — E890 Postprocedural hypothyroidism: Secondary | ICD-10-CM | POA: Diagnosis not present

## 2020-07-14 DIAGNOSIS — E1165 Type 2 diabetes mellitus with hyperglycemia: Secondary | ICD-10-CM | POA: Diagnosis not present

## 2020-07-14 DIAGNOSIS — R7989 Other specified abnormal findings of blood chemistry: Secondary | ICD-10-CM | POA: Insufficient documentation

## 2020-07-14 DIAGNOSIS — N289 Disorder of kidney and ureter, unspecified: Secondary | ICD-10-CM | POA: Diagnosis not present

## 2020-07-14 DIAGNOSIS — Z0001 Encounter for general adult medical examination with abnormal findings: Secondary | ICD-10-CM | POA: Diagnosis not present

## 2020-07-14 DIAGNOSIS — E785 Hyperlipidemia, unspecified: Secondary | ICD-10-CM

## 2020-07-14 DIAGNOSIS — N189 Chronic kidney disease, unspecified: Secondary | ICD-10-CM

## 2020-07-14 MED ORDER — LEVOTHYROXINE SODIUM 50 MCG PO TABS: 50.0000 ug | ORAL_TABLET | Freq: Every day | ORAL | 3 refills | Status: DC

## 2020-07-14 MED ORDER — ATORVASTATIN CALCIUM 80 MG PO TABS: 80.0000 mg | ORAL_TABLET | Freq: Every day | ORAL | 3 refills | Status: DC

## 2020-07-14 NOTE — Progress Notes (Addendum)
Established Patient Office Visit  Subjective:  Patient ID: Erica Mills, female    DOB: 1956-03-13  Age: 65 y.o. MRN: [id number]       Chief Complaint:: wellness exam and Follow-up  hld with myalgias, new low thyroid, mild elevated LFT and AKI       HPI:  Erica Mills is a 65 y.o. female here for wellness exam , due for flu shot, o/w up to date.     Wt Readings from Last 3 Encounters:  07/14/20 183 lb (83 kg)  12/27/19 187 lb (84.8 kg)  06/29/19 192 lb (87.1 kg)   BP Readings from Last 3 Encounters:  07/14/20 120/76  12/27/19 120/82  06/29/19 118/80   Immunization History  Administered Date(s) Administered  . Fluad Quad(high Dose 65+) 07/14/2020  . Influenza Split 04/02/2013  . Influenza Whole 04/13/2007  . Influenza, Quadrivalent, Recombinant, Inj, Pf 02/22/2019  . Influenza,inj,Quad PF,6+ Mos 03/05/2014, 03/24/2015, 05/17/2016, 03/08/2017  . Influenza-Unspecified 02/25/2019  . Moderna Sars-Covid-2 Vaccination 09/07/2019, 10/08/2019  . PPD Test 03/04/2017  . Pneumococcal Conjugate-13 05/31/2016  . Pneumococcal Polysaccharide-23 09/09/2014  . Td 02/14/2009  . Tdap 06/29/2019   Health Maintenance Due  Topic Date Due  . PAP SMEAR-Modifier  07/12/2019  . COVID-19 Vaccine (3 - Moderna risk 4-dose series) 11/05/2019  . COLONOSCOPY (Pts 45-22yrs Insurance coverage will need to be confirmed)  07/06/2020  . DEXA SCAN  Never done  . FOOT EXAM  06/28/2020  . PNA vac Low Risk Adult (2 of 2 - PPSV23) 07/08/2020        Also c/o > 6 mo diffuse very mild myalgias but seem to be worse since taking the statin crestor, and asks for change of statin.  Denies hyper or hypo thyroid symptoms such as voice, skin or hair change, but has new mild elevated TSH x 2.  Also Denies worsening reflux, abd pain, dysphagia, n/v, bowel change or blood, but has mild elevated LFTs  Also renal function appears mild worsening for unclear reason, pt has not had reduced po intake, no new meds and Denies  urinary symptoms such as dysuria, frequency, urgency, flank pain, hematuria or n/v, fever, chills.  Pt denies chest pain, increased sob or doe, wheezing, orthopnea, PND, increased LE swelling, palpitations, dizziness or syncope.   Past Medical History:  Diagnosis Date  . ACHILLES TENDINITIS 10/06/2009  . ALLERGIC RHINITIS 05/19/2007  . CARPAL TUNNEL SYNDROME, BILATERAL 05/19/2007  . Cyst of breast, right, solitary 07/2012  . ELBOW PAIN, RIGHT 04/21/2008  . Fibroids   . GLUCOSE INTOLERANCE 02/14/2009  . H/O menorrhagia   . HYPERLIPIDEMIA 05/19/2007  . HYPERTENSION 05/19/2007  . HYPERTHYROIDISM 03/10/2009  . Lateral epicondylitis  of elbow 04/21/2008  . SINUSITIS- ACUTE-NOS 05/11/2010  . SMOKER 05/19/2007   Past Surgical History:  Procedure Laterality Date  . CESAREAN SECTION    . TONSILLECTOMY      reports that she has quit smoking. She has never used smokeless tobacco. She reports that she does not drink alcohol and does not use drugs. family history includes Cancer in her paternal uncle; Diabetes in her father, mother, and sister; Hypertension in her father and mother. Allergies  Allergen Reactions  . Crestor [Rosuvastatin] Other (See Comments)    myalgia  . Sulfonamide Derivatives     REACTION: rash   Current Outpatient Medications on File Prior to Visit  Medication Sig Dispense Refill  . amLODipine (NORVASC) 10 MG tablet TAKE 1 TABLET BY MOUTH EVERY DAY 90 tablet  1  . aspirin 81 MG tablet Take 81 mg by mouth daily.    . cetirizine (ZYRTEC) 10 MG tablet Take 1 tablet (10 mg total) by mouth daily. 30 tablet 11  . cyclobenzaprine (FLEXERIL) 5 MG tablet Take 1 tablet (5 mg total) by mouth 3 (three) times daily as needed for muscle spasms. 30 tablet 2  . diclofenac sodium (VOLTAREN) 1 % GEL Apply 2 g topically 4 (four) times daily as needed. 200 g 3  . gabapentin (NEURONTIN) 100 MG capsule Take 2 capsules (200 mg total) by mouth at bedtime. 180 capsule 1  . irbesartan (AVAPRO) 150 MG  tablet TAKE 1 TABLET BY MOUTH EVERY DAY 90 tablet 2  . lidocaine (LIDODERM) 5 % Place 1 patch onto the skin daily. Remove & Discard patch within 12 hours or as directed by MD 40 patch 2  . metFORMIN (GLUCOPHAGE-XR) 500 MG 24 hr tablet TAKE 1 TABLET BY MOUTH EVERY DAY WITH BREAKFAST 90 tablet 1  . neomycin-polymyxin-hydrocortisone (CORTISPORIN) OTIC solution Place 4 drops into both ears 4 (four) times daily. 10 mL 2  . sertraline (ZOLOFT) 50 MG tablet TAKE 1 TABLET BY MOUTH EVERY DAY 90 tablet 2  . triamcinolone (NASACORT) 55 MCG/ACT AERO nasal inhaler Place 2 sprays into the nose daily. 1 Inhaler 12  . triamcinolone cream (KENALOG) 0.1 % Apply 1 application topically 4 (four) times daily. As needed for rash 30 g 0  . vitamin B-12 (CYANOCOBALAMIN) 1000 MCG tablet Take 1 tablet (1,000 mcg total) by mouth daily. 90 tablet 1   No current facility-administered medications on file prior to visit.        ROS:  All others reviewed and negative.  Objective        PE:  BP 120/76   Pulse 78   Temp 98.3 F (36.8 C) (Oral)   Ht 5\' 3"  (1.6 m)   Wt 183 lb (83 kg)   SpO2 97%   BMI 32.42 kg/m                 Constitutional: Pt appears in NAD               HENT: Head: NCAT.                Right Ear: External ear normal.                 Left Ear: External ear normal.                Eyes: . Pupils are equal, round, and reactive to light. Conjunctivae and EOM are normal               Nose: without d/c or deformity               Neck: Neck supple. Gross normal ROM               Cardiovascular: Normal rate and regular rhythm.                 Pulmonary/Chest: Effort normal and breath sounds without rales or wheezing.                Abd:  Soft, NT, ND, + BS, no organomegaly               Neurological: Pt is alert. At baseline orientation, motor grossly intact               Skin: Skin is warm. No  rashes, no other new lesions, LE edema - none               Psychiatric: Pt behavior is normal without  agitation   Assessment/Plan:  Erica Mills is a 65 y.o. Black or African American [2] female with  has a past medical history of ACHILLES TENDINITIS (10/06/2009), ALLERGIC RHINITIS (05/19/2007), CARPAL TUNNEL SYNDROME, BILATERAL (05/19/2007), Cyst of breast, right, solitary (07/2012), ELBOW PAIN, RIGHT (04/21/2008), Fibroids, GLUCOSE INTOLERANCE (02/14/2009), H/O menorrhagia, HYPERLIPIDEMIA (05/19/2007), HYPERTENSION (05/19/2007), HYPERTHYROIDISM (03/10/2009), Lateral epicondylitis  of elbow (04/21/2008), SINUSITIS- ACUTE-NOS (05/11/2010), and SMOKER (05/19/2007).  Micro: none  Cardiac tracings I have personally interpreted today:  none  Pertinent Radiological findings (summarize): none   Lab Results  Component Value Date   WBC 10.5 07/07/2020   HGB 13.3 07/07/2020   HCT 39.6 07/07/2020   PLT 227.0 07/07/2020   GLUCOSE 98 07/07/2020   CHOL 144 07/07/2020   TRIG 81.0 07/07/2020   HDL 56.30 07/07/2020   LDLDIRECT 153.3 05/19/2007   LDLCALC 71 07/07/2020   ALT 41 (H) 07/07/2020   AST 48 (H) 07/07/2020   NA 141 07/07/2020   K 3.9 07/07/2020   CL 109 07/07/2020   CREATININE 1.38 (H) 07/07/2020   BUN 24 (H) 07/07/2020   CO2 25 07/07/2020   TSH 11.03 (H) 07/07/2020   HGBA1C 6.7 (H) 07/07/2020   MICROALBUR 14.2 (H) 07/07/2020     Assessment & Plan:   Problem List Items Addressed This Visit      High   Encounter for well adult exam with abnormal findings - Primary    Age and sex appropriate education and counseling updated with regular exercise and diet Referrals for preventative services - none needed Immunizations addressed - for flu shot Smoking counseling  - none needed Evidence for depression or other mood disorder - none significant Most recent labs reviewed. I have personally reviewed and have noted: 1) the patient's medical and social history 2) The patient's current medications and supplements 3) The patient's height, weight, and BMI have been recorded in the chart          Medium   Hypothyroidism    New onset, for levothryoxine 50 mcg and f/u lab next visit      Relevant Medications   levothyroxine (SYNTHROID) 50 MCG tablet   Hyperlipidemia    Lab Results  Component Value Date   LDLCALC 71 07/07/2020   Stable, but with myalgias, ok for change crestor 40 to lipitor 80      Relevant Medications   atorvastatin (LIPITOR) 80 MG tablet   Essential hypertension    BP Readings from Last 3 Encounters:  07/14/20 120/76  12/27/19 120/82  06/29/19 118/80   Stable, pt to continue medical treatment amlodipine, avapro  Current Outpatient Medications (Endocrine & Metabolic):  .  levothyroxine (SYNTHROID) 50 MCG tablet, Take 1 tablet (50 mcg total) by mouth daily. .  metFORMIN (GLUCOPHAGE-XR) 500 MG 24 hr tablet, TAKE 1 TABLET BY MOUTH EVERY DAY WITH BREAKFAST  Current Outpatient Medications (Cardiovascular):  .  amLODipine (NORVASC) 10 MG tablet, TAKE 1 TABLET BY MOUTH EVERY DAY .  atorvastatin (LIPITOR) 80 MG tablet, Take 1 tablet (80 mg total) by mouth daily. .  irbesartan (AVAPRO) 150 MG tablet, TAKE 1 TABLET BY MOUTH EVERY DAY  Current Outpatient Medications (Respiratory):  .  cetirizine (ZYRTEC) 10 MG tablet, Take 1 tablet (10 mg total) by mouth daily. Marland Kitchen  triamcinolone (NASACORT) 55 MCG/ACT AERO nasal inhaler, Place 2 sprays  into the nose daily.  Current Outpatient Medications (Analgesics):  .  aspirin 81 MG tablet, Take 81 mg by mouth daily.  Current Outpatient Medications (Hematological):  .  vitamin B-12 (CYANOCOBALAMIN) 1000 MCG tablet, Take 1 tablet (1,000 mcg total) by mouth daily.  Current Outpatient Medications (Other):  .  cyclobenzaprine (FLEXERIL) 5 MG tablet, Take 1 tablet (5 mg total) by mouth 3 (three) times daily as needed for muscle spasms. .  diclofenac sodium (VOLTAREN) 1 % GEL, Apply 2 g topically 4 (four) times daily as needed. .  gabapentin (NEURONTIN) 100 MG capsule, Take 2 capsules (200 mg total) by mouth at bedtime. .   lidocaine (LIDODERM) 5 %, Place 1 patch onto the skin daily. Remove & Discard patch within 12 hours or as directed by MD .  neomycin-polymyxin-hydrocortisone (CORTISPORIN) OTIC solution, Place 4 drops into both ears 4 (four) times daily. .  sertraline (ZOLOFT) 50 MG tablet, TAKE 1 TABLET BY MOUTH EVERY DAY .  triamcinolone cream (KENALOG) 0.1 %, Apply 1 application topically 4 (four) times daily. As needed for rash       Relevant Medications   atorvastatin (LIPITOR) 80 MG tablet   Diabetes (HCC)    Lab Results  Component Value Date   HGBA1C 6.7 (H) 07/07/2020   Stable, pt to continue current medical treatment metformin       Relevant Medications   atorvastatin (LIPITOR) 80 MG tablet   Acute on chronic renal insufficiency    Mild, for increased fluids, abdomen u/s, and f/u lab      Relevant Orders   US Abdomen Complete   Abnormal LFTs    Etiology unclear, for f/u abdomen u/s to image      Relevant Orders   US Abdomen Complete    Other Visit Diagnoses    Needs flu shot       Relevant Orders   Flu Vaccine QUAD High Dose(Fluad) (Completed)      Meds ordered this encounter  Medications  . atorvastatin (LIPITOR) 80 MG tablet    Sig: Take 1 tablet (80 mg total) by mouth daily.    Dispense:  90 tablet    Refill:  3  . levothyroxine (SYNTHROID) 50 MCG tablet    Sig: Take 1 tablet (50 mcg total) by mouth daily.    Dispense:  90 tablet    Refill:  3    Follow-up: Return in about 3 months (around 10/11/2020).   Cathlean Cower, MD 07/15/2020 7:11 PM [facility]

## 2020-07-14 NOTE — Patient Instructions (Addendum)
You had the flu shot today  Please take all new medication as prescribed - the new thyroid medication  Ok to stop the crestor, and change to the lipitor  Please continue all other medications as before, and refills have been done if requested.  Please have the pharmacy call with any other refills you may need.  Please continue your efforts at being more active, low cholesterol diet, and weight control.  You are otherwise up to date with prevention measures today.  Please keep your appointments with your specialists as you may have planned  You will be contacted regarding the referral for: abdomen ultrasound  Please make an Appointment to return in 3 months

## 2020-07-15 ENCOUNTER — Encounter: Payer: Self-pay | Admitting: Internal Medicine

## 2020-07-15 NOTE — Assessment & Plan Note (Signed)
Lab Results  Component Value Date   LDLCALC 71 07/07/2020   Stable, but with myalgias, ok for change crestor 40 to lipitor 80

## 2020-07-15 NOTE — Assessment & Plan Note (Signed)
New onset, for levothryoxine 50 mcg and f/u lab next visit

## 2020-07-15 NOTE — Assessment & Plan Note (Signed)
Etiology unclear, for f/u abdomen u/s to image

## 2020-07-15 NOTE — Assessment & Plan Note (Signed)
BP Readings from Last 3 Encounters:  07/14/20 120/76  12/27/19 120/82  06/29/19 118/80   Stable, pt to continue medical treatment amlodipine, avapro  Current Outpatient Medications (Endocrine & Metabolic):  .  levothyroxine (SYNTHROID) 50 MCG tablet, Take 1 tablet (50 mcg total) by mouth daily. .  metFORMIN (GLUCOPHAGE-XR) 500 MG 24 hr tablet, TAKE 1 TABLET BY MOUTH EVERY DAY WITH BREAKFAST  Current Outpatient Medications (Cardiovascular):  .  amLODipine (NORVASC) 10 MG tablet, TAKE 1 TABLET BY MOUTH EVERY DAY .  atorvastatin (LIPITOR) 80 MG tablet, Take 1 tablet (80 mg total) by mouth daily. .  irbesartan (AVAPRO) 150 MG tablet, TAKE 1 TABLET BY MOUTH EVERY DAY  Current Outpatient Medications (Respiratory):  .  cetirizine (ZYRTEC) 10 MG tablet, Take 1 tablet (10 mg total) by mouth daily. Marland Kitchen  triamcinolone (NASACORT) 55 MCG/ACT AERO nasal inhaler, Place 2 sprays into the nose daily.  Current Outpatient Medications (Analgesics):  .  aspirin 81 MG tablet, Take 81 mg by mouth daily.  Current Outpatient Medications (Hematological):  .  vitamin B-12 (CYANOCOBALAMIN) 1000 MCG tablet, Take 1 tablet (1,000 mcg total) by mouth daily.  Current Outpatient Medications (Other):  .  cyclobenzaprine (FLEXERIL) 5 MG tablet, Take 1 tablet (5 mg total) by mouth 3 (three) times daily as needed for muscle spasms. .  diclofenac sodium (VOLTAREN) 1 % GEL, Apply 2 g topically 4 (four) times daily as needed. .  gabapentin (NEURONTIN) 100 MG capsule, Take 2 capsules (200 mg total) by mouth at bedtime. .  lidocaine (LIDODERM) 5 %, Place 1 patch onto the skin daily. Remove & Discard patch within 12 hours or as directed by MD .  neomycin-polymyxin-hydrocortisone (CORTISPORIN) OTIC solution, Place 4 drops into both ears 4 (four) times daily. .  sertraline (ZOLOFT) 50 MG tablet, TAKE 1 TABLET BY MOUTH EVERY DAY .  triamcinolone cream (KENALOG) 0.1 %, Apply 1 application topically 4 (four) times daily. As needed  for rash

## 2020-07-15 NOTE — Assessment & Plan Note (Signed)
Mild, for increased fluids, abdomen u/s, and f/u lab

## 2020-07-15 NOTE — Assessment & Plan Note (Signed)
Age and sex appropriate education and counseling updated with regular exercise and diet Referrals for preventative services - none needed Immunizations addressed - for flu shot Smoking counseling  - none needed Evidence for depression or other mood disorder - none significant Most recent labs reviewed. I have personally reviewed and have noted: 1) the patient's medical and social history 2) The patient's current medications and supplements 3) The patient's height, weight, and BMI have been recorded in the chart

## 2020-07-15 NOTE — Assessment & Plan Note (Addendum)
Lab Results  Component Value Date   HGBA1C 6.7 (H) 07/07/2020   Stable, pt to continue current medical treatment metformin

## 2020-10-12 ENCOUNTER — Other Ambulatory Visit: Payer: Self-pay | Admitting: Internal Medicine

## 2020-10-16 ENCOUNTER — Other Ambulatory Visit: Payer: Self-pay | Admitting: Internal Medicine

## 2020-10-16 DIAGNOSIS — Z1231 Encounter for screening mammogram for malignant neoplasm of breast: Secondary | ICD-10-CM

## 2020-10-20 ENCOUNTER — Other Ambulatory Visit: Payer: Self-pay

## 2020-10-20 ENCOUNTER — Encounter: Payer: Self-pay | Admitting: Internal Medicine

## 2020-10-20 ENCOUNTER — Ambulatory Visit (INDEPENDENT_AMBULATORY_CARE_PROVIDER_SITE_OTHER): Payer: Medicare HMO | Admitting: Internal Medicine

## 2020-10-20 VITALS — BP 122/80 | HR 95 | Temp 98.9°F | Ht 63.0 in | Wt 174.0 lb

## 2020-10-20 DIAGNOSIS — E1165 Type 2 diabetes mellitus with hyperglycemia: Secondary | ICD-10-CM

## 2020-10-20 DIAGNOSIS — R945 Abnormal results of liver function studies: Secondary | ICD-10-CM | POA: Diagnosis not present

## 2020-10-20 DIAGNOSIS — E890 Postprocedural hypothyroidism: Secondary | ICD-10-CM | POA: Diagnosis not present

## 2020-10-20 DIAGNOSIS — E559 Vitamin D deficiency, unspecified: Secondary | ICD-10-CM

## 2020-10-20 DIAGNOSIS — I10 Essential (primary) hypertension: Secondary | ICD-10-CM | POA: Diagnosis not present

## 2020-10-20 DIAGNOSIS — Z23 Encounter for immunization: Secondary | ICD-10-CM

## 2020-10-20 DIAGNOSIS — R7989 Other specified abnormal findings of blood chemistry: Secondary | ICD-10-CM

## 2020-10-20 DIAGNOSIS — Z1211 Encounter for screening for malignant neoplasm of colon: Secondary | ICD-10-CM | POA: Diagnosis not present

## 2020-10-20 DIAGNOSIS — N1831 Chronic kidney disease, stage 3a: Secondary | ICD-10-CM

## 2020-10-20 DIAGNOSIS — E538 Deficiency of other specified B group vitamins: Secondary | ICD-10-CM

## 2020-10-20 NOTE — Patient Instructions (Addendum)
You had the Pneumovax pneumonia shot today  You will be contacted regarding the referral for: colonoscopy  Please make sure to follow up with the Abdomen ultrasound you have planned for may 27  Please remember to call for your yearly eye exam as well  Please continue all other medications as before, including the new thyroid, and cholesterol meds from your last visit  Please have the pharmacy call with any other refills you may need.  Please continue your efforts at being more active, low cholesterol diet, and weight control.  Please keep your appointments with your specialists as you may have planned  Please go to the LAB at the blood drawing area for the tests to be done  You will be contacted by phone if any changes need to be made immediately.  Otherwise, you will receive a letter about your results with an explanation, but please check with MyChart first.  Please remember to sign up for MyChart if you have not done so, as this will be important to you in the future with finding out test results, communicating by private email, and scheduling acute appointments online when needed.  Please make an Appointment to return in 6 months, or sooner if needed

## 2020-10-20 NOTE — Progress Notes (Deleted)
Patient ID: Erica Mills, female   DOB: [birth year], 65 y.o.   MRN: [id number]        Chief Complaint: follow up HTN, HLD and hyperglycemia ***       HPI:  Erica Mills is a 65 y.o. female here with c/o         Has abd u/s sched for may 27, had to wait on insurance.  Wt Readings from Last 3 Encounters:  10/20/20 174 lb (78.9 kg)  07/14/20 183 lb (83 kg)  12/27/19 187 lb (84.8 kg)   BP Readings from Last 3 Encounters:  10/20/20 122/80  07/14/20 120/76  12/27/19 120/82         Past Medical History:  Diagnosis Date  . ACHILLES TENDINITIS 10/06/2009  . ALLERGIC RHINITIS 05/19/2007  . CARPAL TUNNEL SYNDROME, BILATERAL 05/19/2007  . Cyst of breast, right, solitary 07/2012  . ELBOW PAIN, RIGHT 04/21/2008  . Fibroids   . GLUCOSE INTOLERANCE 02/14/2009  . H/O menorrhagia   . HYPERLIPIDEMIA 05/19/2007  . HYPERTENSION 05/19/2007  . HYPERTHYROIDISM 03/10/2009  . Lateral epicondylitis  of elbow 04/21/2008  . SINUSITIS- ACUTE-NOS 05/11/2010  . SMOKER 05/19/2007   Past Surgical History:  Procedure Laterality Date  . CESAREAN SECTION    . TONSILLECTOMY      reports that she has quit smoking. She has never used smokeless tobacco. She reports that she does not drink alcohol and does not use drugs. family history includes Cancer in her paternal uncle; Diabetes in her father, mother, and sister; Hypertension in her father and mother. Allergies  Allergen Reactions  . Crestor [Rosuvastatin] Other (See Comments)    myalgia  . Sulfonamide Derivatives     REACTION: rash   Current Outpatient Medications on File Prior to Visit  Medication Sig Dispense Refill  . amLODipine (NORVASC) 10 MG tablet TAKE 1 TABLET BY MOUTH EVERY DAY 90 tablet 1  . aspirin 81 MG tablet Take 81 mg by mouth daily.    Marland Kitchen atorvastatin (LIPITOR) 80 MG tablet Take 1 tablet (80 mg total) by mouth daily. 90 tablet 3  . cetirizine (ZYRTEC) 10 MG tablet Take 1 tablet (10 mg total) by mouth daily. 30 tablet 11  . cyclobenzaprine  (FLEXERIL) 5 MG tablet Take 1 tablet (5 mg total) by mouth 3 (three) times daily as needed for muscle spasms. 30 tablet 2  . diclofenac sodium (VOLTAREN) 1 % GEL Apply 2 g topically 4 (four) times daily as needed. 200 g 3  . gabapentin (NEURONTIN) 100 MG capsule Take 2 capsules (200 mg total) by mouth at bedtime. 180 capsule 1  . irbesartan (AVAPRO) 150 MG tablet TAKE 1 TABLET BY MOUTH EVERY DAY 90 tablet 2  . levothyroxine (SYNTHROID) 50 MCG tablet Take 1 tablet (50 mcg total) by mouth daily. 90 tablet 3  . lidocaine (LIDODERM) 5 % Place 1 patch onto the skin daily. Remove & Discard patch within 12 hours or as directed by MD 40 patch 2  . metFORMIN (GLUCOPHAGE-XR) 500 MG 24 hr tablet TAKE 1 TABLET BY MOUTH EVERY DAY WITH BREAKFAST 90 tablet 1  . neomycin-polymyxin-hydrocortisone (CORTISPORIN) OTIC solution Place 4 drops into both ears 4 (four) times daily. 10 mL 2  . sertraline (ZOLOFT) 50 MG tablet TAKE 1 TABLET BY MOUTH EVERY DAY 90 tablet 2  . triamcinolone (NASACORT) 55 MCG/ACT AERO nasal inhaler Place 2 sprays into the nose daily. 1 Inhaler 12  . triamcinolone cream (KENALOG) 0.1 % Apply 1 application topically 4 (  four) times daily. As needed for rash 30 g 0  . vitamin B-12 (CYANOCOBALAMIN) 1000 MCG tablet Take 1 tablet (1,000 mcg total) by mouth daily. 90 tablet 1   No current facility-administered medications on file prior to visit.        ROS:  All others reviewed and negative.  Objective        PE:  BP 122/80 (BP Location: Right Arm, Patient Position: Sitting, Cuff Size: Large)   Pulse 95   Temp 98.9 F (37.2 C) (Oral)   Ht 5\' 3"  (1.6 m)   Wt 174 lb (78.9 kg)   SpO2 97%   BMI 30.82 kg/m                 Constitutional: Pt appears in NAD               HENT: Head: NCAT.                Right Ear: External ear normal.                 Left Ear: External ear normal.                Eyes: . Pupils are equal, round, and reactive to light. Conjunctivae and EOM are normal                Nose: without d/c or deformity               Neck: Neck supple. Gross normal ROM               Cardiovascular: Normal rate and regular rhythm.                 Pulmonary/Chest: Effort normal and breath sounds without rales or wheezing.                Abd:  Soft, NT, ND, + BS, no organomegaly               Neurological: Pt is alert. At baseline orientation, motor grossly intact               Skin: Skin is warm. No rashes, no other new lesions, LE edema - ***               Psychiatric: Pt behavior is normal without agitation   Micro: none  Cardiac tracings I have personally interpreted today:  none  Pertinent Radiological findings (summarize): none   Lab Results  Component Value Date   WBC 10.5 07/07/2020   HGB 13.3 07/07/2020   HCT 39.6 07/07/2020   PLT 227.0 07/07/2020   GLUCOSE 98 07/07/2020   CHOL 144 07/07/2020   TRIG 81.0 07/07/2020   HDL 56.30 07/07/2020   LDLDIRECT 153.3 05/19/2007   LDLCALC 71 07/07/2020   ALT 41 (H) 07/07/2020   AST 48 (H) 07/07/2020   NA 141 07/07/2020   K 3.9 07/07/2020   CL 109 07/07/2020   CREATININE 1.38 (H) 07/07/2020   BUN 24 (H) 07/07/2020   CO2 25 07/07/2020   TSH 11.03 (H) 07/07/2020   HGBA1C 6.7 (H) 07/07/2020   MICROALBUR 14.2 (H) 07/07/2020   Assessment/Plan:  Erica Mills is a 65 y.o. Black or African American [2] female with  has a past medical history of ACHILLES TENDINITIS (10/06/2009), ALLERGIC RHINITIS (05/19/2007), CARPAL TUNNEL SYNDROME, BILATERAL (05/19/2007), Cyst of breast, right, solitary (07/2012), ELBOW PAIN, RIGHT (04/21/2008), Fibroids, GLUCOSE INTOLERANCE (02/14/2009), H/O menorrhagia, HYPERLIPIDEMIA (05/19/2007), HYPERTENSION (  05/19/2007), HYPERTHYROIDISM (03/10/2009), Lateral epicondylitis  of elbow (04/21/2008), SINUSITIS- ACUTE-NOS (05/11/2010), and SMOKER (05/19/2007).  No problem-specific Assessment & Plan notes found for this encounter.  Followup: No follow-ups on file.  Cathlean Cower, MD 10/20/2020 9:42 AM [facility]

## 2020-10-23 ENCOUNTER — Other Ambulatory Visit: Payer: Self-pay

## 2020-10-23 ENCOUNTER — Encounter: Payer: Self-pay | Admitting: Internal Medicine

## 2020-10-23 ENCOUNTER — Other Ambulatory Visit (INDEPENDENT_AMBULATORY_CARE_PROVIDER_SITE_OTHER): Payer: Medicare HMO

## 2020-10-23 DIAGNOSIS — E538 Deficiency of other specified B group vitamins: Secondary | ICD-10-CM

## 2020-10-23 DIAGNOSIS — E1165 Type 2 diabetes mellitus with hyperglycemia: Secondary | ICD-10-CM

## 2020-10-23 DIAGNOSIS — E559 Vitamin D deficiency, unspecified: Secondary | ICD-10-CM

## 2020-10-23 DIAGNOSIS — E890 Postprocedural hypothyroidism: Secondary | ICD-10-CM

## 2020-10-23 LAB — BASIC METABOLIC PANEL
BUN: 18 mg/dL (ref 6–23)
CO2: 29 mEq/L (ref 19–32)
Calcium: 9.6 mg/dL (ref 8.4–10.5)
Chloride: 106 mEq/L (ref 96–112)
Creatinine, Ser: 1.07 mg/dL (ref 0.40–1.20)
GFR: 54.59 mL/min — ABNORMAL LOW (ref >60.00)
Glucose, Bld: 85 mg/dL (ref 70–99)
Potassium: 3.7 mEq/L (ref 3.5–5.1)
Sodium: 141 mEq/L (ref 135–145)

## 2020-10-23 LAB — URINALYSIS, ROUTINE W REFLEX MICROSCOPIC
Bilirubin Urine: NEGATIVE
Hgb urine dipstick: NEGATIVE
Ketones, ur: NEGATIVE
Leukocytes,Ua: NEGATIVE
Nitrite: NEGATIVE
RBC / HPF: NONE SEEN (ref 0–?)
Specific Gravity, Urine: 1.02 (ref 1.000–1.030)
Total Protein, Urine: NEGATIVE
Urine Glucose: NEGATIVE
Urobilinogen, UA: 0.2 (ref 0.0–1.0)
pH: 6 (ref 5.0–8.0)

## 2020-10-23 LAB — HEPATIC FUNCTION PANEL
ALT: 27 U/L (ref 0–35)
AST: 23 U/L (ref 0–37)
Albumin: 4.2 g/dL (ref 3.5–5.2)
Alkaline Phosphatase: 81 U/L (ref 39–117)
Bilirubin, Direct: 0.1 mg/dL (ref 0.0–0.3)
Total Bilirubin: 0.5 mg/dL (ref 0.2–1.2)
Total Protein: 7.2 g/dL (ref 6.0–8.3)

## 2020-10-23 LAB — CBC WITH DIFFERENTIAL/PLATELET
Basophils Absolute: 0.1 10*3/uL (ref 0.0–0.1)
Basophils Relative: 0.5 % (ref 0.0–3.0)
Eosinophils Absolute: 0.2 10*3/uL (ref 0.0–0.7)
Eosinophils Relative: 2 % (ref 0.0–5.0)
HCT: 37.7 % (ref 36.0–46.0)
Hemoglobin: 12.6 g/dL (ref 12.0–15.0)
Lymphocytes Relative: 30.3 % (ref 12.0–46.0)
Lymphs Abs: 3 10*3/uL (ref 0.7–4.0)
MCHC: 33.6 g/dL (ref 30.0–36.0)
MCV: 86.8 fl (ref 78.0–100.0)
Monocytes Absolute: 0.7 10*3/uL (ref 0.1–1.0)
Monocytes Relative: 6.8 % (ref 3.0–12.0)
Neutro Abs: 5.9 10*3/uL (ref 1.4–7.7)
Neutrophils Relative %: 60.4 % (ref 43.0–77.0)
Platelets: 222 10*3/uL (ref 150.0–400.0)
RBC: 4.34 Mil/uL (ref 3.87–5.11)
RDW: 16.9 % — ABNORMAL HIGH (ref 11.5–15.5)
WBC: 9.7 10*3/uL (ref 4.0–10.5)

## 2020-10-23 LAB — HEMOGLOBIN A1C: Hgb A1c MFr Bld: 6.7 % — ABNORMAL HIGH (ref 4.6–6.5)

## 2020-10-23 LAB — LIPID PANEL
Cholesterol: 165 mg/dL (ref 0–200)
HDL: 61.8 mg/dL (ref >39.00)
LDL Cholesterol: 88 mg/dL (ref 0–99)
NonHDL: 102.94
Total CHOL/HDL Ratio: 3
Triglycerides: 74 mg/dL (ref 0.0–149.0)
VLDL: 14.8 mg/dL (ref 0.0–40.0)

## 2020-10-23 LAB — MICROALBUMIN / CREATININE URINE RATIO
Creatinine,U: 142.2 mg/dL
Microalb Creat Ratio: 0.7 mg/g (ref 0.0–30.0)
Microalb, Ur: 1 mg/dL (ref 0.0–1.9)

## 2020-10-23 LAB — VITAMIN D 25 HYDROXY (VIT D DEFICIENCY, FRACTURES): VITD: 38.96 ng/mL (ref 30.00–100.00)

## 2020-10-23 LAB — VITAMIN B12: Vitamin B-12: >1550 pg/mL — ABNORMAL HIGH (ref 211–911)

## 2020-10-23 LAB — TSH: TSH: 1.16 u[IU]/mL (ref 0.35–4.50)

## 2020-10-23 LAB — T4, FREE: Free T4: 0.86 ng/dL (ref 0.60–1.60)

## 2020-10-26 ENCOUNTER — Encounter: Payer: Self-pay | Admitting: Internal Medicine

## 2020-10-26 NOTE — Assessment & Plan Note (Addendum)
Per feb 2022 labs, for f/u abd u/s and repeat labs, now that insurance has changed per pt

## 2020-10-26 NOTE — Progress Notes (Signed)
Patient ID: Erica Mills, female   DOB: [birth year], 65 y.o.   MRN: [id number]         Chief Complaint::  Follow-up (3 month) , htn, low thyroid, hld, elevated lfts and renal insufficiency       HPI:  Erica Mills is a 65 y.o. female here for f/u;  due for pneumovax and colon screening, o/w up to date with preventive referral and immunizations  Also, Pt denies chest pain, increased sob or doe, wheezing, orthopnea, PND, increased LE swelling, palpitations, dizziness or syncope.  Trying to follow lower chol diet.  Denies hyper or hypo thyroid symptoms such as voice, skin or hair change.  Denies worsening reflux, abd pain, dysphagia, n/v, bowel change or blood.  Denies urinary symptoms such as dysuria, frequency, urgency, flank pain, hematuria or n/v, fever, chills.  Has abd u/s sched for may 27, had to wait on insurance.  Tolerating lipitor better over crestor in past without joint pain or myalgias.  Wt Readings from Last 3 Encounters:  10/20/20 174 lb (78.9 kg)  07/14/20 183 lb (83 kg)  12/27/19 187 lb (84.8 kg)   BP Readings from Last 3 Encounters:  10/20/20 122/80  07/14/20 120/76  12/27/19 120/82   Immunization History  Administered Date(s) Administered  . Fluad Quad(high Dose 65+) 07/14/2020  . Influenza Split 04/02/2013  . Influenza Whole 04/13/2007  . Influenza, Quadrivalent, Recombinant, Inj, Pf 02/22/2019  . Influenza,inj,Quad PF,6+ Mos 03/05/2014, 03/24/2015, 05/17/2016, 03/08/2017  . Influenza-Unspecified 02/25/2019  . Moderna Sars-Covid-2 Vaccination 09/07/2019, 10/08/2019  . PFIZER(Purple Top)SARS-COV-2 Vaccination 04/10/2020  . PPD Test 03/04/2017  . Pneumococcal Conjugate-13 05/31/2016  . Pneumococcal Polysaccharide-23 09/09/2014, 10/20/2020  . Td 02/14/2009  . Tdap 06/29/2019   Health Maintenance Due  Topic Date Due  . PAP SMEAR-Modifier  07/12/2019  . COLONOSCOPY (Pts 45-70yrs Insurance coverage will need to be confirmed)  07/06/2020  . OPHTHALMOLOGY EXAM   08/26/2020      Past Medical History:  Diagnosis Date  . ACHILLES TENDINITIS 10/06/2009  . ALLERGIC RHINITIS 05/19/2007  . CARPAL TUNNEL SYNDROME, BILATERAL 05/19/2007  . Cyst of breast, right, solitary 07/2012  . ELBOW PAIN, RIGHT 04/21/2008  . Fibroids   . GLUCOSE INTOLERANCE 02/14/2009  . H/O menorrhagia   . HYPERLIPIDEMIA 05/19/2007  . HYPERTENSION 05/19/2007  . HYPERTHYROIDISM 03/10/2009  . Lateral epicondylitis  of elbow 04/21/2008  . SINUSITIS- ACUTE-NOS 05/11/2010  . SMOKER 05/19/2007   Past Surgical History:  Procedure Laterality Date  . CESAREAN SECTION    . TONSILLECTOMY      reports that she has quit smoking. She has never used smokeless tobacco. She reports that she does not drink alcohol and does not use drugs. family history includes Cancer in her paternal uncle; Diabetes in her father, mother, and sister; Hypertension in her father and mother. Allergies  Allergen Reactions  . Crestor [Rosuvastatin] Other (See Comments)    myalgia  . Sulfonamide Derivatives     REACTION: rash   Current Outpatient Medications on File Prior to Visit  Medication Sig Dispense Refill  . amLODipine (NORVASC) 10 MG tablet TAKE 1 TABLET BY MOUTH EVERY DAY 90 tablet 1  . aspirin 81 MG tablet Take 81 mg by mouth daily.    Marland Kitchen atorvastatin (LIPITOR) 80 MG tablet Take 1 tablet (80 mg total) by mouth daily. 90 tablet 3  . cetirizine (ZYRTEC) 10 MG tablet Take 1 tablet (10 mg total) by mouth daily. 30 tablet 11  . cyclobenzaprine (FLEXERIL) 5 MG  tablet Take 1 tablet (5 mg total) by mouth 3 (three) times daily as needed for muscle spasms. 30 tablet 2  . diclofenac sodium (VOLTAREN) 1 % GEL Apply 2 g topically 4 (four) times daily as needed. 200 g 3  . gabapentin (NEURONTIN) 100 MG capsule Take 2 capsules (200 mg total) by mouth at bedtime. 180 capsule 1  . irbesartan (AVAPRO) 150 MG tablet TAKE 1 TABLET BY MOUTH EVERY DAY 90 tablet 2  . levothyroxine (SYNTHROID) 50 MCG tablet Take 1 tablet (50 mcg  total) by mouth daily. 90 tablet 3  . lidocaine (LIDODERM) 5 % Place 1 patch onto the skin daily. Remove & Discard patch within 12 hours or as directed by MD 40 patch 2  . metFORMIN (GLUCOPHAGE-XR) 500 MG 24 hr tablet TAKE 1 TABLET BY MOUTH EVERY DAY WITH BREAKFAST 90 tablet 1  . neomycin-polymyxin-hydrocortisone (CORTISPORIN) OTIC solution Place 4 drops into both ears 4 (four) times daily. 10 mL 2  . sertraline (ZOLOFT) 50 MG tablet TAKE 1 TABLET BY MOUTH EVERY DAY 90 tablet 2  . triamcinolone (NASACORT) 55 MCG/ACT AERO nasal inhaler Place 2 sprays into the nose daily. 1 Inhaler 12  . triamcinolone cream (KENALOG) 0.1 % Apply 1 application topically 4 (four) times daily. As needed for rash 30 g 0  . vitamin B-12 (CYANOCOBALAMIN) 1000 MCG tablet Take 1 tablet (1,000 mcg total) by mouth daily. 90 tablet 1   No current facility-administered medications on file prior to visit.        ROS:  All others reviewed and negative.  Objective        PE:  BP 122/80 (BP Location: Right Arm, Patient Position: Sitting, Cuff Size: Large)   Pulse 95   Temp 98.9 F (37.2 C) (Oral)   Ht 5\' 3"  (1.6 m)   Wt 174 lb (78.9 kg)   SpO2 97%   BMI 30.82 kg/m                 Constitutional: Pt appears in NAD               HENT: Head: NCAT.                Right Ear: External ear normal.                 Left Ear: External ear normal.                Eyes: . Pupils are equal, round, and reactive to light. Conjunctivae and EOM are normal               Nose: without d/c or deformity               Neck: Neck supple. Gross normal ROM               Cardiovascular: Normal rate and regular rhythm.                 Pulmonary/Chest: Effort normal and breath sounds without rales or wheezing.                Abd:  Soft, NT, ND, + BS, no organomegaly               Neurological: Pt is alert. At baseline orientation, motor grossly intact               Skin: Skin is warm. No rashes, no other new lesions, LE edema - none  Psychiatric: Pt behavior is normal without agitation   Micro: none  Cardiac tracings I have personally interpreted today:  none  Pertinent Radiological findings (summarize): none   Lab Results  Component Value Date   WBC 9.7 10/23/2020   HGB 12.6 10/23/2020   HCT 37.7 10/23/2020   PLT 222.0 10/23/2020   GLUCOSE 85 10/23/2020   CHOL 165 10/23/2020   TRIG 74.0 10/23/2020   HDL 61.80 10/23/2020   LDLDIRECT 153.3 05/19/2007   LDLCALC 88 10/23/2020   ALT 27 10/23/2020   AST 23 10/23/2020   NA 141 10/23/2020   K 3.7 10/23/2020   CL 106 10/23/2020   CREATININE 1.07 10/23/2020   BUN 18 10/23/2020   CO2 29 10/23/2020   TSH 1.16 10/23/2020   HGBA1C 6.7 (H) 10/23/2020   MICROALBUR 1.0 10/23/2020   Assessment/Plan:  Erica Mills is a 65 y.o. Black or African American [2] female with  has a past medical history of ACHILLES TENDINITIS (10/06/2009), ALLERGIC RHINITIS (05/19/2007), CARPAL TUNNEL SYNDROME, BILATERAL (05/19/2007), Cyst of breast, right, solitary (07/2012), ELBOW PAIN, RIGHT (04/21/2008), Fibroids, GLUCOSE INTOLERANCE (02/14/2009), H/O menorrhagia, HYPERLIPIDEMIA (05/19/2007), HYPERTENSION (05/19/2007), HYPERTHYROIDISM (03/10/2009), Lateral epicondylitis  of elbow (04/21/2008), SINUSITIS- ACUTE-NOS (05/11/2010), and SMOKER (05/19/2007).  Hypothyroidism New mild, cont replacement levothyroxine and f/u TFTs  Diabetes Lab Results  Component Value Date   HGBA1C 6.7 (H) 10/23/2020   Stable, pt to continue current medical treatment metformin   CKD (chronic kidney disease) stage 3, GFR 30-59 ml/min (HCC) Lab Results  Component Value Date   CREATININE 1.07 10/23/2020   Stable overall, cont to avoid nephrotoxins   Abnormal LFTs Per feb 2022 labs, for f/u abd u/s and repeat labs, now that insurance has changed per pt  Essential hypertension BP Readings from Last 3 Encounters:  10/20/20 122/80  07/14/20 120/76  12/27/19 120/82   Stable, pt to continue medical treatment  avapro   Followup: Return in about 6 months (around 04/22/2021).  Cathlean Cower, MD 10/26/2020 6:33 AM [facility]

## 2020-10-26 NOTE — Assessment & Plan Note (Signed)
Lab Results  Component Value Date   HGBA1C 6.7 (H) 10/23/2020   Stable, pt to continue current medical treatment metformin

## 2020-10-26 NOTE — Assessment & Plan Note (Signed)
New mild, cont replacement levothyroxine and f/u TFTs

## 2020-10-26 NOTE — Assessment & Plan Note (Signed)
BP Readings from Last 3 Encounters:  10/20/20 122/80  07/14/20 120/76  12/27/19 120/82   Stable, pt to continue medical treatment avapro

## 2020-10-26 NOTE — Assessment & Plan Note (Signed)
Lab Results  Component Value Date   CREATININE 1.07 10/23/2020   Stable overall, cont to avoid nephrotoxins

## 2020-11-03 ENCOUNTER — Ambulatory Visit
Admission: RE | Admit: 2020-11-03 | Discharge: 2020-11-03 | Disposition: A | Discharge status: Home / Self Care | Payer: Medicare HMO | Source: Ambulatory Visit | Attending: Internal Medicine | Admitting: Internal Medicine

## 2020-11-03 DIAGNOSIS — R7989 Other specified abnormal findings of blood chemistry: Secondary | ICD-10-CM

## 2020-11-03 DIAGNOSIS — R945 Abnormal results of liver function studies: Secondary | ICD-10-CM

## 2020-11-03 DIAGNOSIS — N189 Chronic kidney disease, unspecified: Secondary | ICD-10-CM

## 2020-11-03 DIAGNOSIS — N179 Acute kidney failure, unspecified: Secondary | ICD-10-CM | POA: Diagnosis not present

## 2020-11-05 ENCOUNTER — Encounter: Payer: Self-pay | Admitting: Internal Medicine

## 2020-12-08 ENCOUNTER — Other Ambulatory Visit: Payer: Self-pay

## 2020-12-08 ENCOUNTER — Ambulatory Visit
Admission: RE | Admit: 2020-12-08 | Discharge: 2020-12-08 | Disposition: A | Discharge status: Home / Self Care | Payer: Medicare HMO | Source: Ambulatory Visit | Attending: Internal Medicine | Admitting: Internal Medicine

## 2020-12-08 DIAGNOSIS — Z1231 Encounter for screening mammogram for malignant neoplasm of breast: Secondary | ICD-10-CM | POA: Diagnosis not present

## 2020-12-22 DIAGNOSIS — L720 Epidermal cyst: Secondary | ICD-10-CM | POA: Diagnosis not present

## 2020-12-22 DIAGNOSIS — B078 Other viral warts: Secondary | ICD-10-CM | POA: Diagnosis not present

## 2021-01-04 ENCOUNTER — Other Ambulatory Visit: Payer: Self-pay | Admitting: Internal Medicine

## 2021-01-07 ENCOUNTER — Other Ambulatory Visit: Payer: Self-pay | Admitting: Internal Medicine

## 2021-01-07 NOTE — Telephone Encounter (Signed)
Please refill as per office routine med refill policy (all routine meds refilled for 3 mo or monthly per pt preference up to one year from last visit, then month to month grace period for 3 mo, then further med refills will have to be denied)  

## 2021-01-10 ENCOUNTER — Encounter: Payer: Self-pay | Admitting: Internal Medicine

## 2021-01-17 ENCOUNTER — Ambulatory Visit (AMBULATORY_SURGERY_CENTER): Payer: Medicare HMO

## 2021-01-17 ENCOUNTER — Other Ambulatory Visit: Payer: Self-pay

## 2021-01-17 VITALS — Ht 63.0 in | Wt 174.0 lb

## 2021-01-17 DIAGNOSIS — Z1211 Encounter for screening for malignant neoplasm of colon: Secondary | ICD-10-CM

## 2021-01-17 NOTE — Progress Notes (Signed)
Pre visit completed via phone call; Patient verified name, DOB, and address;. No egg or soy allergy known to patient  No issues with past sedation with any surgeries or procedures Patient denies ever being told they had issues or difficulty with intubation  No FH of Malignant Hyperthermia No diet pills per patient No home 02 use per patient  No blood thinners per patient  Pt denies issues with constipation  No A fib or A flutter  EMMI video via MyChart  COVID 19 guidelines implemented in [place] today with Pt and RN  Pt is fully vaccinated for Covid x 2 + booster; NO PA's for preps discussed with pt In [facility] today  Discussed with pt there will be an out-of-pocket cost for prep and that varies from $0 to 70 dollars  Due to the COVID-19 pandemic we are asking patients to follow certain guidelines.  Pt aware of COVID protocols and [facility] guidelines

## 2021-01-23 DIAGNOSIS — L720 Epidermal cyst: Secondary | ICD-10-CM | POA: Diagnosis not present

## 2021-01-23 DIAGNOSIS — B078 Other viral warts: Secondary | ICD-10-CM | POA: Diagnosis not present

## 2021-01-29 ENCOUNTER — Other Ambulatory Visit: Payer: Self-pay

## 2021-01-29 ENCOUNTER — Ambulatory Visit (AMBULATORY_SURGERY_CENTER): Payer: Medicare HMO | Admitting: Internal Medicine

## 2021-01-29 ENCOUNTER — Encounter: Payer: Self-pay | Admitting: Internal Medicine

## 2021-01-29 VITALS — BP 132/80 | HR 77 | Temp 98.3°F | Resp 13 | Ht 63.0 in | Wt 174.0 lb

## 2021-01-29 DIAGNOSIS — Z1211 Encounter for screening for malignant neoplasm of colon: Secondary | ICD-10-CM

## 2021-01-29 DIAGNOSIS — K648 Other hemorrhoids: Secondary | ICD-10-CM | POA: Diagnosis not present

## 2021-01-29 DIAGNOSIS — K5730 Diverticulosis of large intestine without perforation or abscess without bleeding: Secondary | ICD-10-CM | POA: Diagnosis not present

## 2021-01-29 MED ORDER — SODIUM CHLORIDE 0.9 % IV SOLN
500.0000 mL | Freq: Once | INTRAVENOUS | Status: DC
Start: 2021-01-29 — End: 2021-01-29

## 2021-01-29 NOTE — Progress Notes (Signed)
Pt's states no medical or surgical changes since previsit or office visit.   VS taken by CW 

## 2021-01-29 NOTE — Op Note (Signed)
[place] Patient Name: Erica Mills Procedure Date: 01/29/2021 3:16 PM MRN: [id number] Endoscopist: Gatha Mayer , MD Age: 65 Referring MD:  Date of Birth: 1956-04-16 Gender: Female Account #: [id number] Procedure:                Colonoscopy Indications:              Screening for colorectal malignant neoplasm, Last                            colonoscopy: 2012 Medicines:                Propofol per Anesthesia, Monitored Anesthesia Care Procedure:                Pre-Anesthesia Assessment:                           - Prior to the procedure, a History and Physical                            was performed, and patient medications and                            allergies were reviewed. The patient's tolerance of                            previous anesthesia was also reviewed. The risks                            and benefits of the procedure and the sedation                            options and risks were discussed with the patient.                            All questions were answered, and informed consent                            was obtained. Prior Anticoagulants: The patient has                            taken no previous anticoagulant or antiplatelet                            agents. ASA Grade Assessment: II - A patient with                            mild systemic disease. After reviewing the risks                            and benefits, the patient was deemed in                            satisfactory condition to undergo the procedure.  After obtaining informed consent, the colonoscope                            was passed under direct vision. Throughout the                            procedure, the patient's blood pressure, pulse, and                            oxygen saturations were monitored continuously. The                            Olympus CF-[id number] ([id number]) Colonoscope was                            introduced through the  anus and advanced to the the                            cecum, identified by appendiceal orifice and                            ileocecal valve. The colonoscopy was performed                            without difficulty. The patient tolerated the                            procedure well. The quality of the bowel                            preparation was good. The ileocecal valve,                            appendiceal orifice, and rectum were photographed. Scope In: 3:23:24 PM Scope Out: 3:42:15 PM Scope Withdrawal Time: 0 hours 13 minutes 34 seconds  Total Procedure Duration: 0 hours 18 minutes 51 seconds  Findings:                 The perianal and digital rectal examinations were                            normal.                           Multiple diverticula were found in the sigmoid                            colon.                           Internal hemorrhoids were found.                           The exam was otherwise without abnormality on  direct and retroflexion views. Complications:            No immediate complications. Estimated Blood Loss:     Estimated blood loss: none. Impression:               - Diverticulosis in the sigmoid colon.                           - Internal hemorrhoids.                           - The examination was otherwise normal on direct                            and retroflexion views.                           - No specimens collected. Recommendation:           - Patient has a contact number available for                            emergencies. The signs and symptoms of potential                            delayed complications were discussed with the                            patient. Return to normal activities tomorrow.                            Written discharge instructions were provided to the                            patient.                           - Resume previous diet.                           -  Continue present medications.                           - Repeat colonoscopy in 10 years for screening                            purposes. Gatha Mayer, MD 01/29/2021 3:47:08 PM This report has been signed electronically.

## 2021-01-29 NOTE — Patient Instructions (Addendum)
No polyps or cancer seen.  You do have diverticulosis - thickened muscle rings and pouches in the colon wall. Please read the handout about this condition.  Hemorrhoids were somewhat swollen also - that is common after a colonoscopy prep causing diarrhea.  Next routine colonoscopy or other screening test in 10 years - 2032.  I appreciate the opportunity to care for you. Gatha Mayer, MD, FACG  YOU HAD AN ENDOSCOPIC PROCEDURE TODAY AT [facility]:   Refer to the procedure report that was given to you for any specific questions about what was found during the examination.  If the procedure report does not answer your questions, please call your gastroenterologist to clarify.  If you requested that your care partner not be given the details of your procedure findings, then the procedure report has been included in a sealed envelope for you to review at your convenience later.  YOU SHOULD EXPECT: Some feelings of bloating in the abdomen. Passage of more gas than usual.  Walking can help get rid of the air that was put into your GI tract during the procedure and reduce the bloating. If you had a lower endoscopy (such as a colonoscopy or flexible sigmoidoscopy) you may notice spotting of blood in your stool or on the toilet paper. If you underwent a bowel prep for your procedure, you may not have a normal bowel movement for a few days.  Please Note:  You might notice some irritation and congestion in your nose or some drainage.  This is from the oxygen used during your procedure.  There is no need for concern and it should clear up in a day or so.  SYMPTOMS TO REPORT IMMEDIATELY:  Following lower endoscopy (colonoscopy or flexible sigmoidoscopy):  Excessive amounts of blood in the stool  Significant tenderness or worsening of abdominal pains  Swelling of the abdomen that is new, acute  Fever of 100F or higher   For urgent or emergent issues, a gastroenterologist can be  reached at any hour by calling [phone]. Do not use MyChart messaging for urgent concerns.    DIET:  We do recommend a small meal at first, but then you may proceed to your regular diet.  Drink plenty of fluids but you should avoid alcoholic beverages for 24 hours.  ACTIVITY:  You should plan to take it easy for the rest of today and you should NOT DRIVE or use heavy machinery until tomorrow (because of the sedation medicines used during the test).    FOLLOW UP: Our staff will call the number listed on your records 48-72 hours following your procedure to check on you and address any questions or concerns that you may have regarding the information given to you following your procedure. If we do not reach you, we will leave a message.  We will attempt to reach you two times.  During this call, we will ask if you have developed any symptoms of COVID 19. If you develop any symptoms (ie: fever, flu-like symptoms, shortness of breath, cough etc.) before then, please call [phone].  If you test positive for Covid 19 in the 2 weeks post procedure, please call and report this information to [place].    If any biopsies were taken you will be contacted by phone or by letter within the next 1-3 weeks.  Please call us at [phone] if you have not heard about the biopsies in 3 weeks.    SIGNATURES/CONFIDENTIALITY: You and/or your care  partner have signed paperwork which will be entered into your electronic medical record.  These signatures attest to the fact that that the information above on your After Visit Summary has been reviewed and is understood.  Full responsibility of the confidentiality of this discharge information lies with you and/or your care-partner.  

## 2021-01-29 NOTE — Progress Notes (Signed)
[facility] History and Physical   Primary Care Physician:  Biagio Borg, MD   Reason for Procedure:   Colon cancer screening  Plan:    colonoscopy     HPI: Erica Mills is a 65 y.o. female here for screening colonoscopy   Past Medical History:  Diagnosis Date   ACHILLES TENDINITIS 10/06/2009   ALLERGIC RHINITIS 05/19/2007   CARPAL TUNNEL SYNDROME, BILATERAL 05/19/2007   Cyst of breast, right, solitary 07/11/2012   Diabetes mellitus without complication ([place])    on meds   ELBOW PAIN, RIGHT 04/21/2008   Fibroids    GERD (gastroesophageal reflux disease)    with certain foods/OTC meds   GLUCOSE INTOLERANCE 02/14/2009   H/O menorrhagia    HYPERLIPIDEMIA 05/19/2007   on meds   HYPERTENSION 05/19/2007   on meds   HYPERTHYROIDISM 03/10/2009   on meds   Lateral epicondylitis  of elbow 04/21/2008   Seasonal allergies    SINUSITIS- ACUTE-NOS 05/11/2010   SMOKER 05/19/2007    Past Surgical History:  Procedure Laterality Date   CESAREAN SECTION     COLONOSCOPY  2012   CG-F/V-movi (exc)-tics-10 yr recall   TONSILLECTOMY      Prior to Admission medications   Medication Sig Start Date End Date Taking? Authorizing Provider  amLODipine (NORVASC) 10 MG tablet TAKE 1 TABLET BY MOUTH EVERY DAY 01/08/21  Yes Biagio Borg, MD  aspirin 81 MG tablet Take 81 mg by mouth daily.   Yes [provider]  atorvastatin (LIPITOR) 80 MG tablet Take 1 tablet (80 mg total) by mouth daily. 07/14/20  Yes Biagio Borg, MD  BIOTIN PO Take 1 tablet by mouth daily at 6 (six) AM.   Yes [provider]  cetirizine (ZYRTEC) 10 MG tablet Take 1 tablet (10 mg total) by mouth daily. 03/24/15  Yes Biagio Borg, MD  Cholecalciferol (VITAMIN D3 PO) Take 1 tablet by mouth daily at 6 (six) AM.   Yes [provider]  levothyroxine (SYNTHROID) 50 MCG tablet Take 1 tablet (50 mcg total) by mouth daily. 07/14/20  Yes Biagio Borg, MD  metFORMIN (GLUCOPHAGE-XR) 500 MG 24 hr  tablet TAKE 1 TABLET BY MOUTH EVERY DAY WITH BREAKFAST 10/12/20  Yes Biagio Borg, MD  Multiple Vitamins-Minerals (ZINC PO) Take 1 tablet by mouth daily at 6 (six) AM.   Yes [provider]  sertraline (ZOLOFT) 50 MG tablet TAKE 1 TABLET BY MOUTH EVERY DAY 01/04/21  Yes Biagio Borg, MD  diclofenac sodium (VOLTAREN) 1 % GEL Apply 2 g topically 4 (four) times daily as needed. Patient not taking: No sig reported 12/04/18   Biagio Borg, MD  lidocaine (LIDODERM) 5 % Place 1 patch onto the skin daily. Remove & Discard patch within 12 hours or as directed by MD 12/04/18   Biagio Borg, MD  neomycin-polymyxin-hydrocortisone (CORTISPORIN) OTIC solution Place 4 drops into both ears 4 (four) times daily. Patient not taking: No sig reported 05/29/18   Biagio Borg, MD  triamcinolone cream (KENALOG) 0.1 % Apply 1 application topically 4 (four) times daily. As needed for rash Patient not taking: No sig reported 03/24/15   Biagio Borg, MD    Current Outpatient Medications  Medication Sig Dispense Refill   amLODipine (NORVASC) 10 MG tablet TAKE 1 TABLET BY MOUTH EVERY DAY 90 tablet 1   aspirin 81 MG tablet Take 81 mg by mouth daily.     atorvastatin (LIPITOR) 80 MG tablet  Take 1 tablet (80 mg total) by mouth daily. 90 tablet 3   BIOTIN PO Take 1 tablet by mouth daily at 6 (six) AM.     cetirizine (ZYRTEC) 10 MG tablet Take 1 tablet (10 mg total) by mouth daily. 30 tablet 11   Cholecalciferol (VITAMIN D3 PO) Take 1 tablet by mouth daily at 6 (six) AM.     levothyroxine (SYNTHROID) 50 MCG tablet Take 1 tablet (50 mcg total) by mouth daily. 90 tablet 3   metFORMIN (GLUCOPHAGE-XR) 500 MG 24 hr tablet TAKE 1 TABLET BY MOUTH EVERY DAY WITH BREAKFAST 90 tablet 1   Multiple Vitamins-Minerals (ZINC PO) Take 1 tablet by mouth daily at 6 (six) AM.     sertraline (ZOLOFT) 50 MG tablet TAKE 1 TABLET BY MOUTH EVERY DAY 90 tablet 2   diclofenac sodium (VOLTAREN) 1 % GEL Apply 2 g topically 4 (four) times daily  as needed. (Patient not taking: No sig reported) 200 g 3   lidocaine (LIDODERM) 5 % Place 1 patch onto the skin daily. Remove & Discard patch within 12 hours or as directed by MD 40 patch 2   neomycin-polymyxin-hydrocortisone (CORTISPORIN) OTIC solution Place 4 drops into both ears 4 (four) times daily. (Patient not taking: No sig reported) 10 mL 2   triamcinolone cream (KENALOG) 0.1 % Apply 1 application topically 4 (four) times daily. As needed for rash (Patient not taking: No sig reported) 30 g 0   Current Facility-Administered Medications  Medication Dose Route Frequency Provider Last Rate Last Admin   0.9 %  sodium chloride infusion  500 mL Intravenous Once Gatha Mayer, MD        Allergies as of 01/29/2021 - Review Complete 01/29/2021  Allergen Reaction Noted   Crestor [rosuvastatin] Other (See Comments) 07/14/2020   Sulfonamide derivatives  05/19/2007    Family History  Problem Relation Age of Onset   Hypertension Mother    Diabetes Mother    Diabetes Father    Hypertension Father    Diabetes Sister    Cancer Paternal Uncle        colon cancer   Colon cancer Neg Hx    Colon polyps Neg Hx    Esophageal cancer Neg Hx    Rectal cancer Neg Hx    Stomach cancer Neg Hx     Social History   Socioeconomic History   Marital status: Widowed    Spouse name: Not on file   Number of children: Not on file   Years of education: Not on file   Highest education level: Not on file  Occupational History   Not on file  Tobacco Use   Smoking status: Former    Types: Cigarettes    Quit date: 06/10/2012    Years since quitting: 8.6   Smokeless tobacco: Never   Tobacco comments:    QUIT SMOKING X 4 WEEKS  Vaping Use   Vaping Use: Never used  Substance and Sexual Activity   Alcohol use: No   Drug use: No   Sexual activity: Yes    Birth control/protection: Surgical, Post-menopausal    Comment: BTL  Other Topics Concern   Not on file  Social History Narrative   Not on file    Social Determinants of Health   Financial Resource Strain: Not on file  Food Insecurity: Not on file  Transportation Needs: Not on file  Physical Activity: Not on file  Stress: Not on file  Social Connections: Not on file  Intimate Partner Violence: Not on file    Review of Systems:  All other review of systems negative except as mentioned in the HPI.  Physical Exam: Vital signs BP 129/87   Pulse 92   Temp 98.3 F (36.8 C)   Ht '5\' 3"'$  (1.6 m)   Wt 174 lb (78.9 kg)   SpO2 96%   BMI 30.82 kg/m   General:   Alert,  Well-developed, well-nourished, pleasant and cooperative in NAD Lungs:  Clear throughout to auscultation.   Heart:  Regular rate and rhythm; no murmurs, clicks, rubs,  or gallops. Abdomen:  Soft, nontender and nondistended. Normal bowel sounds.   Neuro/Psych:  Alert and cooperative. Normal mood and affect. A and O x 3   '@Koji Niehoff'$  Simonne Maffucci, MD, [facility] [phone] (pager) 01/29/2021 3:14 PM@

## 2021-01-29 NOTE — Progress Notes (Signed)
Sedate, gd SR's, VSS, report to RN 

## 2021-01-31 ENCOUNTER — Telehealth: Payer: Self-pay

## 2021-01-31 NOTE — Telephone Encounter (Signed)
Left message on follow up call. 

## 2021-01-31 NOTE — Telephone Encounter (Signed)
Attempted f/u call. No answer, left VM. 

## 2021-02-09 ENCOUNTER — Other Ambulatory Visit: Payer: Self-pay | Admitting: Internal Medicine

## 2021-02-09 NOTE — Telephone Encounter (Signed)
Please refill as per office routine med refill policy (all routine meds to be refilled for 3 mo or monthly (per pt preference) up to one year from last visit, then month to month grace period for 3 mo, then further med refills will have to be denied) ? ?

## 2021-04-27 ENCOUNTER — Ambulatory Visit: Payer: Medicare HMO | Admitting: Internal Medicine

## 2021-05-11 ENCOUNTER — Ambulatory Visit (INDEPENDENT_AMBULATORY_CARE_PROVIDER_SITE_OTHER): Payer: Medicare HMO | Admitting: Internal Medicine

## 2021-05-11 ENCOUNTER — Encounter: Payer: Self-pay | Admitting: Internal Medicine

## 2021-05-11 ENCOUNTER — Other Ambulatory Visit: Payer: Self-pay

## 2021-05-11 VITALS — BP 116/60 | HR 84 | Temp 98.5°F | Ht 63.0 in | Wt 172.0 lb

## 2021-05-11 DIAGNOSIS — E1165 Type 2 diabetes mellitus with hyperglycemia: Secondary | ICD-10-CM

## 2021-05-11 DIAGNOSIS — E559 Vitamin D deficiency, unspecified: Secondary | ICD-10-CM | POA: Diagnosis not present

## 2021-05-11 DIAGNOSIS — E7800 Pure hypercholesterolemia, unspecified: Secondary | ICD-10-CM

## 2021-05-11 DIAGNOSIS — I10 Essential (primary) hypertension: Secondary | ICD-10-CM

## 2021-05-11 DIAGNOSIS — N1831 Chronic kidney disease, stage 3a: Secondary | ICD-10-CM | POA: Diagnosis not present

## 2021-05-11 LAB — HEPATIC FUNCTION PANEL
ALT: 22 U/L (ref 0–35)
AST: 22 U/L (ref 0–37)
Albumin: 4.2 g/dL (ref 3.5–5.2)
Alkaline Phosphatase: 76 U/L (ref 39–117)
Bilirubin, Direct: 0.1 mg/dL (ref 0.0–0.3)
Total Bilirubin: 0.4 mg/dL (ref 0.2–1.2)
Total Protein: 7.3 g/dL (ref 6.0–8.3)

## 2021-05-11 LAB — LIPID PANEL
Cholesterol: 163 mg/dL (ref 0–200)
HDL: 62.2 mg/dL (ref >39.00)
LDL Cholesterol: 86 mg/dL (ref 0–99)
NonHDL: 100.46
Total CHOL/HDL Ratio: 3
Triglycerides: 74 mg/dL (ref 0.0–149.0)
VLDL: 14.8 mg/dL (ref 0.0–40.0)

## 2021-05-11 LAB — VITAMIN D 25 HYDROXY (VIT D DEFICIENCY, FRACTURES): VITD: 32.2 ng/mL (ref 30.00–100.00)

## 2021-05-11 LAB — BASIC METABOLIC PANEL
BUN: 14 mg/dL (ref 6–23)
CO2: 28 mEq/L (ref 19–32)
Calcium: 9.6 mg/dL (ref 8.4–10.5)
Chloride: 107 mEq/L (ref 96–112)
Creatinine, Ser: 0.92 mg/dL (ref 0.40–1.20)
GFR: 65.19 mL/min (ref >60.00)
Glucose, Bld: 109 mg/dL — ABNORMAL HIGH (ref 70–99)
Potassium: 3.9 mEq/L (ref 3.5–5.1)
Sodium: 141 mEq/L (ref 135–145)

## 2021-05-11 LAB — HEMOGLOBIN A1C: Hgb A1c MFr Bld: 6.6 % — ABNORMAL HIGH (ref 4.6–6.5)

## 2021-05-11 NOTE — Progress Notes (Signed)
Patient ID: MARCO ADELSON, female   DOB: 1955/10/31, 65 y.o.   MRN: [id number]        Chief Complaint: follow up HTN, HLD and hyperglycemia, low vit d       HPI:  YOCHEVED DEPNER is a 65 y.o. female Pt denies chest pain, increased sob or doe, wheezing, orthopnea, PND, increased LE swelling, palpitations, dizziness or syncope.   Pt denies polydipsia, polyuria, or new focal neuro s/s     Work part time at [employer], trip and fall over box recently but no injury. Planning for GYn appt after jan 1 with new insurance.  Due for flu shot  Wt down several lbs from peak of 180 last yr with better diet.  Not taking Vit d Wt Readings from Last 3 Encounters:  05/11/21 172 lb (78 kg)  01/29/21 174 lb (78.9 kg)  01/17/21 174 lb (78.9 kg)     BP Readings from Last 3 Encounters:  05/11/21 116/60  01/29/21 132/80  10/20/20 122/80         Past Medical History:  Diagnosis Date   ACHILLES TENDINITIS 10/06/2009   ALLERGIC RHINITIS 05/19/2007   CARPAL TUNNEL SYNDROME, BILATERAL 05/19/2007   Cyst of breast, right, solitary 07/11/2012   Diabetes mellitus without complication ([place])    on meds   ELBOW PAIN, RIGHT 04/21/2008   Fibroids    GERD (gastroesophageal reflux disease)    with certain foods/OTC meds   GLUCOSE INTOLERANCE 02/14/2009   H/O menorrhagia    HYPERLIPIDEMIA 05/19/2007   on meds   HYPERTENSION 05/19/2007   on meds   HYPERTHYROIDISM 03/10/2009   on meds   Lateral epicondylitis  of elbow 04/21/2008   Seasonal allergies    SINUSITIS- ACUTE-NOS 05/11/2010   SMOKER 05/19/2007   Past Surgical History:  Procedure Laterality Date   CESAREAN SECTION     COLONOSCOPY  2012   CG-F/V-movi (exc)-tics-10 yr recall   TONSILLECTOMY      reports that she quit smoking about 8 years ago. Her smoking use included cigarettes. She has never used smokeless tobacco. She reports that she does not drink alcohol and does not use drugs. family history includes Cancer in her paternal uncle; Diabetes in her  father, mother, and sister; Hypertension in her father and mother. Allergies  Allergen Reactions   Crestor [Rosuvastatin] Other (See Comments)    myalgia   Sulfonamide Derivatives     REACTION: rash   Current Outpatient Medications on File Prior to Visit  Medication Sig Dispense Refill   amLODipine (NORVASC) 10 MG tablet TAKE 1 TABLET BY MOUTH EVERY DAY 90 tablet 1   aspirin 81 MG tablet Take 81 mg by mouth daily.     atorvastatin (LIPITOR) 80 MG tablet Take 1 tablet (80 mg total) by mouth daily. 90 tablet 3   BIOTIN PO Take 1 tablet by mouth daily at 6 (six) AM.     cetirizine (ZYRTEC) 10 MG tablet Take 1 tablet (10 mg total) by mouth daily. 30 tablet 11   Cholecalciferol (VITAMIN D3 PO) Take 1 tablet by mouth daily at 6 (six) AM.     diclofenac sodium (VOLTAREN) 1 % GEL Apply 2 g topically 4 (four) times daily as needed. 200 g 3   levothyroxine (SYNTHROID) 50 MCG tablet Take 1 tablet (50 mcg total) by mouth daily. 90 tablet 3   lidocaine (LIDODERM) 5 % Place 1 patch onto the skin daily. Remove & Discard patch within 12 hours or as directed by  MD 40 patch 2   metFORMIN (GLUCOPHAGE-XR) 500 MG 24 hr tablet TAKE 1 TABLET BY MOUTH EVERY DAY WITH BREAKFAST 90 tablet 1   Multiple Vitamins-Minerals (ZINC PO) Take 1 tablet by mouth daily at 6 (six) AM.     neomycin-polymyxin-hydrocortisone (CORTISPORIN) OTIC solution Place 4 drops into both ears 4 (four) times daily. 10 mL 2   sertraline (ZOLOFT) 50 MG tablet TAKE 1 TABLET BY MOUTH EVERY DAY 90 tablet 2   triamcinolone cream (KENALOG) 0.1 % Apply 1 application topically 4 (four) times daily. As needed for rash 30 g 0   No current facility-administered medications on file prior to visit.        ROS:  All others reviewed and negative.  Objective        PE:  BP 116/60   Pulse 84   Temp 98.5 F (36.9 C) (Oral)   Ht 5\' 3"  (1.6 m)   Wt 172 lb (78 kg)   SpO2 96%   BMI 30.47 kg/m                 Constitutional: Pt appears in NAD                HENT: Head: NCAT.                Right Ear: External ear normal.                 Left Ear: External ear normal.                Eyes: . Pupils are equal, round, and reactive to light. Conjunctivae and EOM are normal               Nose: without d/c or deformity               Neck: Neck supple. Gross normal ROM               Cardiovascular: Normal rate and regular rhythm.                 Pulmonary/Chest: Effort normal and breath sounds without rales or wheezing.                Abd:  Soft, NT, ND, + BS, no organomegaly               Neurological: Pt is alert. At baseline orientation, motor grossly intact               Skin: Skin is warm. No rashes, no other new lesions, LE edema - none               Psychiatric: Pt behavior is normal without agitation   Micro: none  Cardiac tracings I have personally interpreted today:  none  Pertinent Radiological findings (summarize): none   Lab Results  Component Value Date   WBC 9.7 10/23/2020   HGB 12.6 10/23/2020   HCT 37.7 10/23/2020   PLT 222.0 10/23/2020   GLUCOSE 109 (H) 05/11/2021   CHOL 163 05/11/2021   TRIG 74.0 05/11/2021   HDL 62.20 05/11/2021   LDLDIRECT 153.3 05/19/2007   LDLCALC 86 05/11/2021   ALT 22 05/11/2021   AST 22 05/11/2021   NA 141 05/11/2021   K 3.9 05/11/2021   CL 107 05/11/2021   CREATININE 0.92 05/11/2021   BUN 14 05/11/2021   CO2 28 05/11/2021   TSH 1.16 10/23/2020   HGBA1C 6.6 (H) 05/11/2021   MICROALBUR 1.0  10/23/2020   Assessment/Plan:  MEAGHANN CHOO is a 65 y.o. Black or African American [2] female with  has a past medical history of ACHILLES TENDINITIS (10/06/2009), ALLERGIC RHINITIS (05/19/2007), CARPAL TUNNEL SYNDROME, BILATERAL (05/19/2007), Cyst of breast, right, solitary (07/11/2012), Diabetes mellitus without complication ([place]), ELBOW PAIN, RIGHT (04/21/2008), Fibroids, GERD (gastroesophageal reflux disease), GLUCOSE INTOLERANCE (02/14/2009), H/O menorrhagia, HYPERLIPIDEMIA (05/19/2007),  HYPERTENSION (05/19/2007), HYPERTHYROIDISM (03/10/2009), Lateral epicondylitis  of elbow (04/21/2008), Seasonal allergies, SINUSITIS- ACUTE-NOS (05/11/2010), and SMOKER (05/19/2007).  Hyperlipidemia Lab Results  Component Value Date   LDLCALC 86 05/11/2021   Uncontrolled, goal ldl < 70, pt to continue current statin lipitor 80 as declines change, add zetia or lipid clinic for now   Essential hypertension BP Readings from Last 3 Encounters:  05/11/21 116/60  01/29/21 132/80  10/20/20 122/80   Stable, pt to continue medical treatment norvasc   Diabetes Lab Results  Component Value Date   HGBA1C 6.6 (H) 05/11/2021   Stable, pt to continue current medical treatment metformin   CKD (chronic kidney disease) stage 3, GFR 30-59 ml/min (HCC) Lab Results  Component Value Date   CREATININE 0.92 05/11/2021   Stable overall, cont to avoid nephrotoxins   Vitamin D deficiency Last vitamin D Lab Results  Component Value Date   VD25OH 32.20 05/11/2021   Low uncontrolled, to start oral replacement  Followup: Return in about 6 months (around 11/09/2021).  Cathlean Cower, MD 05/20/2021 1:14 PM [facility]

## 2021-05-11 NOTE — Patient Instructions (Addendum)
Remember to see your eye doctor soon  Please continue all other medications as before, and refills have been done if requested.  Please have the pharmacy call with any other refills you may need.  Please continue your efforts at being more active, low cholesterol diet, and weight control.  Please keep your appointments with your specialists as you may have planned  Please go to the LAB at the blood drawing area for the tests to be done  You will be contacted by phone if any changes need to be made immediately.  Otherwise, you will receive a letter about your results with an explanation, but please check with MyChart first.  Please make an Appointment to return in 6 months, or sooner if needed

## 2021-05-20 ENCOUNTER — Encounter: Payer: Self-pay | Admitting: Internal Medicine

## 2021-05-20 DIAGNOSIS — E559 Vitamin D deficiency, unspecified: Secondary | ICD-10-CM | POA: Insufficient documentation

## 2021-05-20 NOTE — Assessment & Plan Note (Signed)
Lab Results  Component Value Date   HGBA1C 6.6 (H) 05/11/2021   Stable, pt to continue current medical treatment metformin

## 2021-05-20 NOTE — Assessment & Plan Note (Signed)
BP Readings from Last 3 Encounters:  05/11/21 116/60  01/29/21 132/80  10/20/20 122/80   Stable, pt to continue medical treatment norvasc

## 2021-05-20 NOTE — Assessment & Plan Note (Signed)
Last vitamin D Lab Results  Component Value Date   VD25OH 32.20 05/11/2021   Low uncontrolled, to start oral replacement

## 2021-05-20 NOTE — Assessment & Plan Note (Signed)
Lab Results  Component Value Date   LDLCALC 86 05/11/2021   Uncontrolled, goal ldl < 70, pt to continue current statin lipitor 80 as declines change, add zetia or lipid clinic for now

## 2021-05-20 NOTE — Assessment & Plan Note (Signed)
Lab Results  Component Value Date   CREATININE 0.92 05/11/2021   Stable overall, cont to avoid nephrotoxins

## 2021-07-04 ENCOUNTER — Other Ambulatory Visit: Payer: Self-pay | Admitting: Internal Medicine

## 2021-07-04 NOTE — Telephone Encounter (Signed)
Please refill as per office routine med refill policy (all routine meds to be refilled for 3 mo or monthly (per pt preference) up to one year from last visit, then month to month grace period for 3 mo, then further med refills will have to be denied) ? ?

## 2021-09-12 DIAGNOSIS — E538 Deficiency of other specified B group vitamins: Secondary | ICD-10-CM | POA: Insufficient documentation

## 2021-09-12 DIAGNOSIS — K219 Gastro-esophageal reflux disease without esophagitis: Secondary | ICD-10-CM | POA: Insufficient documentation

## 2021-09-29 ENCOUNTER — Other Ambulatory Visit: Payer: Self-pay | Admitting: Internal Medicine

## 2021-09-29 NOTE — Telephone Encounter (Signed)
Please refill as per office routine med refill policy (all routine meds to be refilled for 3 mo or monthly (per pt preference) up to one year from last visit, then month to month grace period for 3 mo, then further med refills will have to be denied) ? ?

## 2021-10-05 DIAGNOSIS — Z13820 Encounter for screening for osteoporosis: Secondary | ICD-10-CM | POA: Diagnosis not present

## 2021-10-05 DIAGNOSIS — Z01419 Encounter for gynecological examination (general) (routine) without abnormal findings: Secondary | ICD-10-CM | POA: Diagnosis not present

## 2021-10-05 DIAGNOSIS — Z124 Encounter for screening for malignant neoplasm of cervix: Secondary | ICD-10-CM | POA: Diagnosis not present

## 2021-10-05 DIAGNOSIS — Z1239 Encounter for other screening for malignant neoplasm of breast: Secondary | ICD-10-CM | POA: Diagnosis not present

## 2021-10-05 DIAGNOSIS — Z1211 Encounter for screening for malignant neoplasm of colon: Secondary | ICD-10-CM | POA: Diagnosis not present

## 2021-10-14 ENCOUNTER — Other Ambulatory Visit: Payer: Self-pay | Admitting: Internal Medicine

## 2021-10-26 DIAGNOSIS — N898 Other specified noninflammatory disorders of vagina: Secondary | ICD-10-CM | POA: Diagnosis not present

## 2021-10-26 DIAGNOSIS — R87615 Unsatisfactory cytologic smear of cervix: Secondary | ICD-10-CM | POA: Diagnosis not present

## 2021-10-29 ENCOUNTER — Telehealth: Payer: Self-pay | Admitting: Internal Medicine

## 2021-10-29 DIAGNOSIS — K824 Cholesterolosis of gallbladder: Secondary | ICD-10-CM

## 2021-10-29 NOTE — Telephone Encounter (Signed)
-----   Message from Biagio Borg, MD sent at 11/05/2020 11:25 AM EDT ----- Regarding: f/u ultrasound Now due for f/u u/s at 1 yr

## 2021-10-29 NOTE — Telephone Encounter (Signed)
Ok to let pt know  may 2022 ultrasound showed possible GB polyp, so we should repeat the u/s at the 1 yr recommendation  I will do this, hopefully she will get a call soon

## 2021-10-30 NOTE — Telephone Encounter (Signed)
Notified pt w/MD response.../lmb 

## 2021-11-02 ENCOUNTER — Ambulatory Visit
Admission: RE | Admit: 2021-11-02 | Discharge: 2021-11-02 | Disposition: A | Discharge status: Home / Self Care | Payer: Medicare HMO | Source: Ambulatory Visit | Attending: Internal Medicine | Admitting: Internal Medicine

## 2021-11-02 DIAGNOSIS — K824 Cholesterolosis of gallbladder: Secondary | ICD-10-CM

## 2021-11-02 DIAGNOSIS — K760 Fatty (change of) liver, not elsewhere classified: Secondary | ICD-10-CM | POA: Diagnosis not present

## 2021-11-07 ENCOUNTER — Encounter: Payer: Self-pay | Admitting: Family Medicine

## 2021-11-07 ENCOUNTER — Ambulatory Visit (INDEPENDENT_AMBULATORY_CARE_PROVIDER_SITE_OTHER): Payer: Medicare HMO | Admitting: Family Medicine

## 2021-11-07 VITALS — BP 130/82 | HR 103 | Temp 97.8°F | Ht 63.0 in | Wt 178.0 lb

## 2021-11-07 DIAGNOSIS — M5136 Other intervertebral disc degeneration, lumbar region: Secondary | ICD-10-CM

## 2021-11-07 DIAGNOSIS — M5442 Lumbago with sciatica, left side: Secondary | ICD-10-CM

## 2021-11-07 DIAGNOSIS — E119 Type 2 diabetes mellitus without complications: Secondary | ICD-10-CM

## 2021-11-07 DIAGNOSIS — D259 Leiomyoma of uterus, unspecified: Secondary | ICD-10-CM | POA: Insufficient documentation

## 2021-11-07 DIAGNOSIS — N841 Polyp of cervix uteri: Secondary | ICD-10-CM | POA: Insufficient documentation

## 2021-11-07 DIAGNOSIS — E669 Obesity, unspecified: Secondary | ICD-10-CM | POA: Insufficient documentation

## 2021-11-07 MED ORDER — PREDNISONE 20 MG PO TABS: 40.0000 mg | ORAL_TABLET | Freq: Every day | ORAL | 0 refills | Status: DC

## 2021-11-07 MED ORDER — GABAPENTIN 100 MG PO CAPS: 100.0000 mg | ORAL_CAPSULE | Freq: Every day | ORAL | 0 refills | Status: DC

## 2021-11-07 NOTE — Progress Notes (Signed)
Subjective:     Patient ID: Erica Mills, female    DOB: [birth year], 66 y.o.   MRN: [id number]  Chief Complaint  Patient presents with   Leg Pain    Left leg achy pain started yesterday. Starting in the left glute radiating down whole leg to the foot.     HPI Patient is in today for left sided sharp pain that radiates down the back of her left leg to her left foot. No known injury. Similar pain 5 years ago. She saw Dr. Tamala Julian at that time.   Walking makes her pain worse. Sitting improves pain.   She is using a heating pad, Tylenol arthritis and Salon Pas patches.   Health Maintenance Due  Topic Date Due   Zoster Vaccines- Shingrix (1 of 2) Never done   DEXA SCAN  Never done   OPHTHALMOLOGY EXAM  08/26/2020   FOOT EXAM  10/20/2021   URINE MICROALBUMIN  10/23/2021    Past Medical History:  Diagnosis Date   ACHILLES TENDINITIS 10/06/2009   ALLERGIC RHINITIS 05/19/2007   CARPAL TUNNEL SYNDROME, BILATERAL 05/19/2007   Cyst of breast, right, solitary 07/11/2012   Diabetes mellitus without complication ([place])    on meds   ELBOW PAIN, RIGHT 04/21/2008   Fibroids    GERD (gastroesophageal reflux disease)    with certain foods/OTC meds   GLUCOSE INTOLERANCE 02/14/2009   H/O menorrhagia    HYPERLIPIDEMIA 05/19/2007   on meds   HYPERTENSION 05/19/2007   on meds   HYPERTHYROIDISM 03/10/2009   on meds   Lateral epicondylitis  of elbow 04/21/2008   Seasonal allergies    SINUSITIS- ACUTE-NOS 05/11/2010   SMOKER 05/19/2007    Past Surgical History:  Procedure Laterality Date   CESAREAN SECTION     COLONOSCOPY  2012   CG-F/V-movi (exc)-tics-10 yr recall   TONSILLECTOMY      Family History  Problem Relation Age of Onset   Hypertension Mother    Diabetes Mother    Diabetes Father    Hypertension Father    Diabetes Sister    Cancer Paternal Uncle        colon cancer   Colon cancer Neg Hx    Colon polyps Neg Hx    Esophageal cancer Neg Hx    Rectal cancer Neg Hx     Stomach cancer Neg Hx     Social History   Socioeconomic History   Marital status: Widowed    Spouse name: Not on file   Number of children: Not on file   Years of education: Not on file   Highest education level: Not on file  Occupational History   Not on file  Tobacco Use   Smoking status: Former    Types: Cigarettes    Quit date: 06/10/2012    Years since quitting: 9.4   Smokeless tobacco: Never   Tobacco comments:    QUIT SMOKING X 4 WEEKS  Vaping Use   Vaping Use: Never used  Substance and Sexual Activity   Alcohol use: No   Drug use: No   Sexual activity: Yes    Birth control/protection: Surgical, Post-menopausal    Comment: BTL  Other Topics Concern   Not on file  Social History Narrative   Not on file   Social Determinants of Health   Financial Resource Strain: Not on file  Food Insecurity: Not on file  Transportation Needs: Not on file  Physical Activity: Not on file  Stress: Not on  file  Social Connections: Not on file  Intimate Partner Violence: Not on file    Outpatient Medications Prior to Visit  Medication Sig Dispense Refill   amLODipine (NORVASC) 10 MG tablet TAKE 1 TABLET BY MOUTH EVERY DAY 90 tablet 1   aspirin 81 MG tablet Take 81 mg by mouth daily.     atorvastatin (LIPITOR) 80 MG tablet TAKE 1 TABLET BY MOUTH EVERY DAY 90 tablet 3   BIOTIN PO Take 1 tablet by mouth daily at 6 (six) AM.     cetirizine (ZYRTEC) 10 MG tablet Take 1 tablet (10 mg total) by mouth daily. 30 tablet 11   Cholecalciferol (VITAMIN D3 PO) Take 1 tablet by mouth daily at 6 (six) AM.     diclofenac sodium (VOLTAREN) 1 % GEL Apply 2 g topically 4 (four) times daily as needed. 200 g 3   levothyroxine (SYNTHROID) 50 MCG tablet TAKE 1 TABLET BY MOUTH EVERY DAY 90 tablet 3   lidocaine (LIDODERM) 5 % Place 1 patch onto the skin daily. Remove & Discard patch within 12 hours or as directed by MD 40 patch 2   metFORMIN (GLUCOPHAGE-XR) 500 MG 24 hr tablet TAKE 1 TABLET BY MOUTH  EVERY DAY WITH BREAKFAST 90 tablet 0   Multiple Vitamins-Minerals (ZINC PO) Take 1 tablet by mouth daily at 6 (six) AM.     neomycin-polymyxin-hydrocortisone (CORTISPORIN) OTIC solution Place 4 drops into both ears 4 (four) times daily. 10 mL 2   sertraline (ZOLOFT) 50 MG tablet TAKE 1 TABLET BY MOUTH EVERY DAY 90 tablet 2   triamcinolone cream (KENALOG) 0.1 % Apply 1 application topically 4 (four) times daily. As needed for rash 30 g 0   No facility-administered medications prior to visit.    Allergies  Allergen Reactions   Crestor [Rosuvastatin] Other (See Comments)    myalgia   Sulfonamide Derivatives     REACTION: rash    ROS Pertinent positives and negatives in the history of present illness.     Objective:    Physical Exam Constitutional:      General: She is not in acute distress.    Appearance: She is not ill-appearing.  Eyes:     Conjunctiva/sclera: Conjunctivae normal.     Pupils: Pupils are equal, round, and reactive to light.  Cardiovascular:     Rate and Rhythm: Normal rate.     Pulses: Normal pulses.  Pulmonary:     Effort: Pulmonary effort is normal.     Breath sounds: Normal breath sounds.  Musculoskeletal:     Cervical back: Normal, normal range of motion and neck supple.     Thoracic back: Normal.     Lumbar back: Tenderness present. Decreased range of motion.     Right lower leg: No edema.     Left lower leg: No edema.     Comments: Decreased ROM of lumbar spine due to pain. No midline tenderness. TTP over the left SI joint. LLE is neurovascularly intact.   Skin:    General: Skin is warm and dry.  Neurological:     General: No focal deficit present.     Mental Status: She is alert and oriented to person, place, and time.     Sensory: No sensory deficit.     Motor: No weakness.     Coordination: Coordination normal.     Gait: Gait normal.     Deep Tendon Reflexes: Reflexes normal.  Psychiatric:        Mood and  Affect: Mood normal.         Behavior: Behavior normal.    BP 130/82 (BP Location: Left Arm, Patient Position: Sitting, Cuff Size: Large)   Pulse (!) 103   Temp 97.8 F (36.6 C) (Temporal)   Ht '5\' 3"'$  (1.6 m)   Wt 178 lb (80.7 kg)   SpO2 98%   BMI 31.53 kg/m  Wt Readings from Last 3 Encounters:  11/07/21 178 lb (80.7 kg)  05/11/21 172 lb (78 kg)  01/29/21 174 lb (78.9 kg)       Assessment & Plan:   Problem List Items Addressed This Visit       Endocrine   Type 2 diabetes mellitus without complication, without long-term current use of insulin (HCC)     Musculoskeletal and Integument   Lumbar degenerative disc disease   Relevant Medications   predniSONE (DELTASONE) 20 MG tablet     Other   Lower back pain - Primary   Relevant Medications   predniSONE (DELTASONE) 20 MG tablet   gabapentin (NEURONTIN) 100 MG capsule   No red flag symptoms.  Oral prednisone and gabapentin prescribed. She has done well on these medications in the past for this issue. Advised blood sugars may be elevated while on steroids.  Continue ice/heat, topical medication and follow up with Dr. Tamala Julian if not back to baseline after finishing the steroids or here if worsening.   I am having Jayd Forrey. Kleist start on predniSONE and gabapentin. I am also having her maintain her aspirin, cetirizine, triamcinolone cream, neomycin-polymyxin-hydrocortisone, diclofenac sodium, lidocaine, BIOTIN PO, Multiple Vitamins-Minerals (ZINC PO), Cholecalciferol (VITAMIN D3 PO), atorvastatin, amLODipine, levothyroxine, metFORMIN, and sertraline.  Meds ordered this encounter  Medications   predniSONE (DELTASONE) 20 MG tablet    Sig: Take 2 tablets (40 mg total) by mouth daily with breakfast.    Dispense:  10 tablet    Refill:  0    Order Specific Question:   Supervising Provider    Answer:   Pricilla Holm A [[id number]]   gabapentin (NEURONTIN) 100 MG capsule    Sig: Take 1 capsule (100 mg total) by mouth at bedtime.    Dispense:  30 capsule     Refill:  0    Order Specific Question:   Supervising Provider    Answer:   Pricilla Holm A [[id number]]

## 2021-11-07 NOTE — Patient Instructions (Addendum)
Take the steroids as prescribed for the next 5 days.  Make sure you are staying well-hydrated on the steroids.  Also, steroids can increase your blood sugar so be aware of this.  You may take the gabapentin at bedtime but be aware this medication can be sedating.  Continue with heat or ice.  You can also continue with the Salonpas patches.  Follow-up with Dr. Tamala Julian if you are worsening or not back to baseline when you complete the steroids.

## 2021-11-09 ENCOUNTER — Ambulatory Visit (INDEPENDENT_AMBULATORY_CARE_PROVIDER_SITE_OTHER): Payer: Medicare HMO | Admitting: Internal Medicine

## 2021-11-09 ENCOUNTER — Encounter: Payer: Self-pay | Admitting: Internal Medicine

## 2021-11-09 VITALS — BP 122/88 | HR 81 | Resp 18 | Ht 63.0 in | Wt 180.0 lb

## 2021-11-09 DIAGNOSIS — E559 Vitamin D deficiency, unspecified: Secondary | ICD-10-CM | POA: Diagnosis not present

## 2021-11-09 DIAGNOSIS — E890 Postprocedural hypothyroidism: Secondary | ICD-10-CM

## 2021-11-09 DIAGNOSIS — N1831 Chronic kidney disease, stage 3a: Secondary | ICD-10-CM | POA: Diagnosis not present

## 2021-11-09 DIAGNOSIS — M5441 Lumbago with sciatica, right side: Secondary | ICD-10-CM | POA: Diagnosis not present

## 2021-11-09 DIAGNOSIS — E119 Type 2 diabetes mellitus without complications: Secondary | ICD-10-CM

## 2021-11-09 DIAGNOSIS — Z0001 Encounter for general adult medical examination with abnormal findings: Secondary | ICD-10-CM | POA: Diagnosis not present

## 2021-11-09 DIAGNOSIS — I10 Essential (primary) hypertension: Secondary | ICD-10-CM | POA: Diagnosis not present

## 2021-11-09 DIAGNOSIS — E7800 Pure hypercholesterolemia, unspecified: Secondary | ICD-10-CM | POA: Diagnosis not present

## 2021-11-09 DIAGNOSIS — E1165 Type 2 diabetes mellitus with hyperglycemia: Secondary | ICD-10-CM

## 2021-11-09 DIAGNOSIS — M810 Age-related osteoporosis without current pathological fracture: Secondary | ICD-10-CM | POA: Diagnosis not present

## 2021-11-09 DIAGNOSIS — E538 Deficiency of other specified B group vitamins: Secondary | ICD-10-CM | POA: Diagnosis not present

## 2021-11-09 DIAGNOSIS — M5442 Lumbago with sciatica, left side: Secondary | ICD-10-CM | POA: Diagnosis not present

## 2021-11-09 MED ORDER — RYBELSUS 3 MG PO TABS: 3.0000 mg | ORAL_TABLET | Freq: Every day | ORAL | 3 refills | Status: DC

## 2021-11-09 NOTE — Progress Notes (Addendum)
Patient ID: Erica Mills, female   DOB: 1956-05-06, 66 y.o.   MRN: [id number]         Chief Complaint:: wellness exam and 6 month f/u (She would like to discuss her nerve pain in her left leg. )         HPI:  Erica Mills is a 66 y.o. female here for wellness exam; due for dxa, yearly eye exam, plans to have shingrix at local pharmacy, declines covid booster, shingrix, o/w up to date                        Also Pt denies chest pain, increased sob or doe, wheezing, orthopnea, PND, increased LE swelling, palpitations, dizziness or syncope.   Pt denies polydipsia, polyuria, or new focal neuro s/s.  But has persistent difficulty with losing wt despite diet and activity.  Also c/o 3 mo worsening left sciatica symptoms with pain and numbness to the left upper leg, mild intermittent, burning, worse to bend or stand up, similar to prior right sided sciatica that seemed improved with prednisone and gabapentin.  Denies worsening with walking, and does not want MRI for now.     Wt Readings from Last 3 Encounters:  11/09/21 180 lb (81.6 kg)  11/07/21 178 lb (80.7 kg)  05/11/21 172 lb (78 kg)   BP Readings from Last 3 Encounters:  11/09/21 122/88  11/07/21 130/82  05/11/21 116/60   Immunization History  Administered Date(s) Administered   Fluad Quad(high Dose 65+) 07/14/2020   Influenza Split 04/02/2013   Influenza Whole 04/13/2007   Influenza, Quadrivalent, Recombinant, Inj, Pf 02/22/2019   Influenza,inj,Quad PF,6+ Mos 03/05/2014, 03/24/2015, 05/17/2016, 03/08/2017   Influenza-Unspecified 03/05/2014, 03/24/2015, 05/17/2016, 02/25/2019   Moderna Sars-Covid-2 Vaccination 09/07/2019, 10/08/2019   PFIZER(Purple Top)SARS-COV-2 Vaccination 04/10/2020   PPD Test 03/04/2017   Pneumococcal Conjugate-13 05/31/2016   Pneumococcal Polysaccharide-23 09/09/2014, 10/20/2020   Td 02/14/2009   Tdap 06/29/2019   Health Maintenance Due  Topic Date Due   DEXA SCAN  Never done   OPHTHALMOLOGY EXAM  08/26/2020    URINE MICROALBUMIN  10/23/2021   HEMOGLOBIN A1C  11/09/2021      Past Medical History:  Diagnosis Date   ACHILLES TENDINITIS 10/06/2009   ALLERGIC RHINITIS 05/19/2007   CARPAL TUNNEL SYNDROME, BILATERAL 05/19/2007   Cyst of breast, right, solitary 07/11/2012   Diabetes mellitus without complication ([place])    on meds   ELBOW PAIN, RIGHT 04/21/2008   Fibroids    GERD (gastroesophageal reflux disease)    with certain foods/OTC meds   GLUCOSE INTOLERANCE 02/14/2009   H/O menorrhagia    HYPERLIPIDEMIA 05/19/2007   on meds   HYPERTENSION 05/19/2007   on meds   HYPERTHYROIDISM 03/10/2009   on meds   Lateral epicondylitis  of elbow 04/21/2008   Seasonal allergies    SINUSITIS- ACUTE-NOS 05/11/2010   SMOKER 05/19/2007   Past Surgical History:  Procedure Laterality Date   CESAREAN SECTION     COLONOSCOPY  2012   CG-F/V-movi (exc)-tics-10 yr recall   TONSILLECTOMY      reports that she quit smoking about 9 years ago. Her smoking use included cigarettes. She has never used smokeless tobacco. She reports that she does not drink alcohol and does not use drugs. family history includes Cancer in her paternal uncle; Diabetes in her father, mother, and sister; Hypertension in her father and mother. Allergies  Allergen Reactions   Crestor [Rosuvastatin] Other (See Comments)  myalgia   Sulfonamide Derivatives     REACTION: rash   Current Outpatient Medications on File Prior to Visit  Medication Sig Dispense Refill   amLODipine (NORVASC) 10 MG tablet TAKE 1 TABLET BY MOUTH EVERY DAY 90 tablet 1   aspirin 81 MG tablet Take 81 mg by mouth daily.     atorvastatin (LIPITOR) 80 MG tablet TAKE 1 TABLET BY MOUTH EVERY DAY 90 tablet 3   BIOTIN PO Take 1 tablet by mouth daily at 6 (six) AM.     cetirizine (ZYRTEC) 10 MG tablet Take 1 tablet (10 mg total) by mouth daily. 30 tablet 11   Cholecalciferol (VITAMIN D3 PO) Take 1 tablet by mouth daily at 6 (six) AM.     diclofenac sodium  (VOLTAREN) 1 % GEL Apply 2 g topically 4 (four) times daily as needed. 200 g 3   gabapentin (NEURONTIN) 100 MG capsule Take 1 capsule (100 mg total) by mouth at bedtime. 30 capsule 0   levothyroxine (SYNTHROID) 50 MCG tablet TAKE 1 TABLET BY MOUTH EVERY DAY 90 tablet 3   lidocaine (LIDODERM) 5 % Place 1 patch onto the skin daily. Remove & Discard patch within 12 hours or as directed by MD 40 patch 2   metFORMIN (GLUCOPHAGE-XR) 500 MG 24 hr tablet TAKE 1 TABLET BY MOUTH EVERY DAY WITH BREAKFAST 90 tablet 0   Multiple Vitamins-Minerals (ZINC PO) Take 1 tablet by mouth daily at 6 (six) AM.     neomycin-polymyxin-hydrocortisone (CORTISPORIN) OTIC solution Place 4 drops into both ears 4 (four) times daily. 10 mL 2   predniSONE (DELTASONE) 20 MG tablet Take 2 tablets (40 mg total) by mouth daily with breakfast. 10 tablet 0   sertraline (ZOLOFT) 50 MG tablet TAKE 1 TABLET BY MOUTH EVERY DAY 90 tablet 2   triamcinolone cream (KENALOG) 0.1 % Apply 1 application topically 4 (four) times daily. As needed for rash 30 g 0   No current facility-administered medications on file prior to visit.        ROS:  All others reviewed and negative.  Objective        PE:  BP 122/88   Pulse 81   Resp 18   Ht '5\' 3"'$  (1.6 m)   Wt 180 lb (81.6 kg)   SpO2 98%   BMI 31.89 kg/m                 Constitutional: Pt appears in NAD               HENT: Head: NCAT.                Right Ear: External ear normal.                 Left Ear: External ear normal.                Eyes: . Pupils are equal, round, and reactive to light. Conjunctivae and EOM are normal               Nose: without d/c or deformity               Neck: Neck supple. Gross normal ROM               Cardiovascular: Normal rate and regular rhythm.                 Pulmonary/Chest: Effort normal and breath sounds without rales or wheezing.  Abd:  Soft, NT, ND, + BS, no organomegaly               Neurological: Pt is alert. At baseline  orientation, motor grossly intact               Skin: Skin is warm. No rashes, no other new lesions, LE edema - none               Psychiatric: Pt behavior is normal without agitation   Micro: none  Cardiac tracings I have personally interpreted today:  none  Pertinent Radiological findings (summarize): none   Lab Results  Component Value Date   WBC 9.7 10/23/2020   HGB 12.6 10/23/2020   HCT 37.7 10/23/2020   PLT 222.0 10/23/2020   GLUCOSE 109 (H) 05/11/2021   CHOL 163 05/11/2021   TRIG 74.0 05/11/2021   HDL 62.20 05/11/2021   LDLDIRECT 153.3 05/19/2007   LDLCALC 86 05/11/2021   ALT 22 05/11/2021   AST 22 05/11/2021   NA 141 05/11/2021   K 3.9 05/11/2021   CL 107 05/11/2021   CREATININE 0.92 05/11/2021   BUN 14 05/11/2021   CO2 28 05/11/2021   TSH 1.16 10/23/2020   HGBA1C 6.6 (H) 05/11/2021   MICROALBUR 1.0 10/23/2020   Assessment/Plan:  Erica Mills is a 66 y.o. Black or African American [2] female with  has a past medical history of ACHILLES TENDINITIS (10/06/2009), ALLERGIC RHINITIS (05/19/2007), CARPAL TUNNEL SYNDROME, BILATERAL (05/19/2007), Cyst of breast, right, solitary (07/11/2012), Diabetes mellitus without complication ([place]), ELBOW PAIN, RIGHT (04/21/2008), Fibroids, GERD (gastroesophageal reflux disease), GLUCOSE INTOLERANCE (02/14/2009), H/O menorrhagia, HYPERLIPIDEMIA (05/19/2007), HYPERTENSION (05/19/2007), HYPERTHYROIDISM (03/10/2009), Lateral epicondylitis  of elbow (04/21/2008), Seasonal allergies, SINUSITIS- ACUTE-NOS (05/11/2010), and SMOKER (05/19/2007).  Vitamin D deficiency Last vitamin D Lab Results  Component Value Date   VD25OH 32.20 05/11/2021   Low, to start oral replacement   Encounter for well adult exam with abnormal findings Age and sex appropriate education and counseling updated with regular exercise and diet Referrals for preventative services - for DXA, and optho referral Immunizations addressed - declines covid booster and  shingrix Smoking counseling  - none needed Evidence for depression or other mood disorder - none significant Most recent labs reviewed. I have personally reviewed and have noted: 1) the patient's medical and social history 2) The patient's current medications and supplements 3) The patient's height, weight, and BMI have been recorded in the chart   Hypothyroidism Lab Results  Component Value Date   TSH 1.16 10/23/2020   Stable, pt to continue levothyroxine 50 mcg qd   Hyperlipidemia Lab Results  Component Value Date   LDLCALC 86 05/11/2021   Stable, pt to continue current statin lipitor 80 qd   Essential hypertension BP Readings from Last 3 Encounters:  11/09/21 122/88  11/07/21 130/82  05/11/21 116/60   Stable, pt to continue medical treatment norvasc 10 mg qd   Diabetes Lab Results  Component Value Date   HGBA1C 6.6 (H) 05/11/2021   Stable, pt to continue current medical treatment metformin ER 500 qd , also add rybelsus 3 mg qd   CKD (chronic kidney disease) stage 3, GFR 30-59 ml/min (HCC) Lab Results  Component Value Date   CREATININE 0.92 05/11/2021   Stable overall, cont to avoid nephrotoxins   Low back pain due to bilateral sciatica D/w pt, exam benign, to continue gabapentin, pt declines mRI for now Followup: Return in about 6 months (around 05/11/2022).  Cathlean Cower, MD 11/11/2021  8:06 PM [facility]

## 2021-11-09 NOTE — Assessment & Plan Note (Signed)
Last vitamin D Lab Results  Component Value Date   VD25OH 32.20 05/11/2021   Low, to start oral replacement

## 2021-11-09 NOTE — Patient Instructions (Signed)
Please take all new medication as prescribed  - the rybelsus 3 mg for sugar and wt loss  Please schedule the bone density test before leaving today at the scheduling desk (where you check out)  Please have your Shingles Shot done for free at the local pharmacy  Please continue all other medications as before, including the steroid pill and the gabapentin for now  Please have the pharmacy call with any other refills you may need.  Please continue your efforts at being more active, low cholesterol diet, and weight control.  You are otherwise up to date with prevention measures today.  Please keep your appointments with your specialists as you may have planned  You will be contacted regarding the referral for: Eye doctor (Dr Katy Fitch)  Please make an Appointment to return in 6 months, or sooner if needed, also with Lab Appointment for testing done 3-5 days before at the [facility] (so this is for TWO appointments - please see the scheduling desk as you leave)  Due to the ongoing Covid 19 pandemic, our lab now requires an appointment for any labs done at our office.  If you need labs done and do not have an appointment, please call our office ahead of time to schedule before presenting to the lab for your testing.

## 2021-11-11 ENCOUNTER — Encounter: Payer: Self-pay | Admitting: Internal Medicine

## 2021-11-11 DIAGNOSIS — M5441 Lumbago with sciatica, right side: Secondary | ICD-10-CM | POA: Insufficient documentation

## 2021-11-11 NOTE — Assessment & Plan Note (Addendum)
BP Readings from Last 3 Encounters:  11/09/21 122/88  11/07/21 130/82  05/11/21 116/60   Stable, pt to continue medical treatment norvasc 10 mg qd

## 2021-11-11 NOTE — Assessment & Plan Note (Signed)
Lab Results  Component Value Date   HGBA1C 6.6 (H) 05/11/2021   Stable, pt to continue current medical treatment metformin ER 500 qd , also add rybelsus 3 mg qd

## 2021-11-11 NOTE — Assessment & Plan Note (Signed)
Lab Results  Component Value Date   LDLCALC 86 05/11/2021   Stable, pt to continue current statin lipitor 80 qd

## 2021-11-11 NOTE — Assessment & Plan Note (Signed)
Lab Results  Component Value Date   TSH 1.16 10/23/2020   Stable, pt to continue levothyroxine 50 mcg qd

## 2021-11-11 NOTE — Assessment & Plan Note (Signed)
D/w pt, exam benign, to continue gabapentin, pt declines mRI for now

## 2021-11-11 NOTE — Assessment & Plan Note (Signed)
Age and sex appropriate education and counseling updated with regular exercise and diet Referrals for preventative services - for DXA, and optho referral Immunizations addressed - declines covid booster and shingrix Smoking counseling  - none needed Evidence for depression or other mood disorder - none significant Most recent labs reviewed. I have personally reviewed and have noted: 1) the patient's medical and social history 2) The patient's current medications and supplements 3) The patient's height, weight, and BMI have been recorded in the chart

## 2021-11-11 NOTE — Addendum Note (Signed)
Addended by: Biagio Borg on: 11/11/2021 08:07 PM   Modules accepted: Orders

## 2021-11-11 NOTE — Assessment & Plan Note (Signed)
Lab Results  Component Value Date   CREATININE 0.92 05/11/2021   Stable overall, cont to avoid nephrotoxins

## 2021-11-16 ENCOUNTER — Ambulatory Visit (INDEPENDENT_AMBULATORY_CARE_PROVIDER_SITE_OTHER)
Admission: RE | Admit: 2021-11-16 | Discharge: 2021-11-16 | Disposition: A | Discharge status: Home / Self Care | Payer: Medicare HMO | Source: Ambulatory Visit | Attending: Internal Medicine | Admitting: Internal Medicine

## 2021-11-16 DIAGNOSIS — M810 Age-related osteoporosis without current pathological fracture: Secondary | ICD-10-CM | POA: Diagnosis not present

## 2021-12-20 ENCOUNTER — Other Ambulatory Visit: Payer: Self-pay | Admitting: Obstetrics and Gynecology

## 2021-12-20 DIAGNOSIS — Z1231 Encounter for screening mammogram for malignant neoplasm of breast: Secondary | ICD-10-CM

## 2021-12-20 DIAGNOSIS — Z13820 Encounter for screening for osteoporosis: Secondary | ICD-10-CM

## 2021-12-28 ENCOUNTER — Other Ambulatory Visit: Payer: Self-pay | Admitting: Internal Medicine

## 2021-12-28 NOTE — Telephone Encounter (Signed)
Please refill as per office routine med refill policy (all routine meds to be refilled for 3 mo or monthly (per pt preference) up to one year from last visit, then month to month grace period for 3 mo, then further med refills will have to be denied) ? ?

## 2022-01-11 ENCOUNTER — Ambulatory Visit
Admission: RE | Admit: 2022-01-11 | Discharge: 2022-01-11 | Disposition: A | Discharge status: Home / Self Care | Payer: Medicare HMO | Source: Ambulatory Visit | Attending: Obstetrics and Gynecology | Admitting: Obstetrics and Gynecology

## 2022-01-11 DIAGNOSIS — Z1231 Encounter for screening mammogram for malignant neoplasm of breast: Secondary | ICD-10-CM

## 2022-01-13 DIAGNOSIS — R921 Mammographic calcification found on diagnostic imaging of breast: Secondary | ICD-10-CM | POA: Insufficient documentation

## 2022-01-14 ENCOUNTER — Other Ambulatory Visit: Payer: Self-pay | Admitting: Obstetrics and Gynecology

## 2022-01-14 DIAGNOSIS — R928 Other abnormal and inconclusive findings on diagnostic imaging of breast: Secondary | ICD-10-CM

## 2022-01-25 ENCOUNTER — Other Ambulatory Visit: Payer: Self-pay | Admitting: Obstetrics and Gynecology

## 2022-01-25 ENCOUNTER — Ambulatory Visit
Admission: RE | Admit: 2022-01-25 | Discharge: 2022-01-25 | Disposition: A | Discharge status: Home / Self Care | Payer: Medicare HMO | Source: Ambulatory Visit | Attending: Obstetrics and Gynecology | Admitting: Obstetrics and Gynecology

## 2022-01-25 DIAGNOSIS — R921 Mammographic calcification found on diagnostic imaging of breast: Secondary | ICD-10-CM | POA: Diagnosis not present

## 2022-01-25 DIAGNOSIS — R928 Other abnormal and inconclusive findings on diagnostic imaging of breast: Secondary | ICD-10-CM | POA: Diagnosis not present

## 2022-02-05 ENCOUNTER — Ambulatory Visit
Admission: RE | Admit: 2022-02-05 | Discharge: 2022-02-05 | Disposition: A | Discharge status: Home / Self Care | Payer: Medicare HMO | Source: Ambulatory Visit | Attending: Obstetrics and Gynecology | Admitting: Obstetrics and Gynecology

## 2022-02-05 DIAGNOSIS — R921 Mammographic calcification found on diagnostic imaging of breast: Secondary | ICD-10-CM | POA: Diagnosis not present

## 2022-02-05 DIAGNOSIS — N641 Fat necrosis of breast: Secondary | ICD-10-CM | POA: Diagnosis not present

## 2022-03-22 ENCOUNTER — Ambulatory Visit (INDEPENDENT_AMBULATORY_CARE_PROVIDER_SITE_OTHER): Payer: Medicare HMO

## 2022-03-22 VITALS — Ht 63.0 in | Wt 176.0 lb

## 2022-03-22 DIAGNOSIS — Z0000 Encounter for general adult medical examination without abnormal findings: Secondary | ICD-10-CM | POA: Diagnosis not present

## 2022-03-22 NOTE — Patient Instructions (Signed)
Erica Mills , Thank you for taking time to come for your Medicare Wellness Visit. I appreciate your ongoing commitment to your health goals. Please review the following plan we discussed and let me know if I can assist you in the future.   These are the goals we discussed:  Goals      DIET - INCREASE WATER INTAKE        This is a list of the screening recommended for you and due dates:  Health Maintenance  Topic Date Due   Zoster (Shingles) Vaccine (1 of 2) Never done   COVID-19 Vaccine (4 - Mixed Product risk series) 06/05/2020   Eye exam for diabetics  08/26/2020   Yearly kidney health urinalysis for diabetes  10/23/2021   Hemoglobin A1C  11/09/2021   Flu Shot  01/08/2022   Yearly kidney function blood test for diabetes  05/11/2022   Complete foot exam   11/10/2022   Mammogram  01/26/2023   Tetanus Vaccine  06/28/2029   Colon Cancer Screening  01/30/2031   Pneumonia Vaccine  Completed   DEXA scan (bone density measurement)  Completed   Hepatitis C Screening: USPSTF Recommendation to screen - Ages 71-79 yo.  Completed   HPV Vaccine  Aged Out    Advanced directives: Advance directive discussed with you today. I have provided a copy for you to complete at home and have notarized. Once this is complete please bring a copy in to our office so we can scan it into your chart.   Conditions/risks identified: Aim for 30 minutes of exercise or brisk walking, 6-8 glasses of water, and 5 servings of fruits and vegetables each day.   Next appointment: Follow up in one year for your annual wellness visit    Preventive Care 65 Years and Older, Female Preventive care refers to lifestyle choices and visits with your health care provider that can promote health and wellness. What does preventive care include? A yearly physical exam. This is also called an annual well check. Dental exams once or twice a year. Routine eye exams. Ask your health care provider how often you should have your eyes  checked. Personal lifestyle choices, including: Daily care of your teeth and gums. Regular physical activity. Eating a healthy diet. Avoiding tobacco and drug use. Limiting alcohol use. Practicing safe sex. Taking low-dose aspirin every day. Taking vitamin and mineral supplements as recommended by your health care provider. What happens during an annual well check? The services and screenings done by your health care provider during your annual well check will depend on your age, overall health, lifestyle risk factors, and family history of disease. Counseling  Your health care provider may ask you questions about your: Alcohol use. Tobacco use. Drug use. Emotional well-being. Home and relationship well-being. Sexual activity. Eating habits. History of falls. Memory and ability to understand (cognition). Work and work Statistician. Reproductive health. Screening  You may have the following tests or measurements: Height, weight, and BMI. Blood pressure. Lipid and cholesterol levels. These may be checked every 5 years, or more frequently if you are over 82 years old. Skin check. Lung cancer screening. You may have this screening every year starting at age 24 if you have a 30-pack-year history of smoking and currently smoke or have quit within the past 15 years. Fecal occult blood test (FOBT) of the stool. You may have this test every year starting at age 23. Flexible sigmoidoscopy or colonoscopy. You may have a sigmoidoscopy every 5 years or  a colonoscopy every 10 years starting at age 34. Hepatitis C blood test. Hepatitis B blood test. Sexually transmitted disease (STD) testing. Diabetes screening. This is done by checking your blood sugar (glucose) after you have not eaten for a while (fasting). You may have this done every 1-3 years. Bone density scan. This is done to screen for osteoporosis. You may have this done starting at age 73. Mammogram. This may be done every 1-2  years. Talk to your health care provider about how often you should have regular mammograms. Talk with your health care provider about your test results, treatment options, and if necessary, the need for more tests. Vaccines  Your health care provider may recommend certain vaccines, such as: Influenza vaccine. This is recommended every year. Tetanus, diphtheria, and acellular pertussis (Tdap, Td) vaccine. You may need a Td booster every 10 years. Zoster vaccine. You may need this after age 10. Pneumococcal 13-valent conjugate (PCV13) vaccine. One dose is recommended after age 12. Pneumococcal polysaccharide (PPSV23) vaccine. One dose is recommended after age 48. Talk to your health care provider about which screenings and vaccines you need and how often you need them. This information is not intended to replace advice given to you by your health care provider. Make sure you discuss any questions you have with your health care provider. Document Released: 06/23/2015 Document Revised: 02/14/2016 Document Reviewed: 03/28/2015 Elsevier Interactive Patient Education  2017 [place] Prevention in the Home Falls can cause injuries. They can happen to people of all ages. There are many things you can do to make your home safe and to help prevent falls. What can I do on the outside of my home? Regularly fix the edges of walkways and driveways and fix any cracks. Remove anything that might make you trip as you walk through a door, such as a raised step or threshold. Trim any bushes or trees on the path to your home. Use bright outdoor lighting. Clear any walking paths of anything that might make someone trip, such as rocks or tools. Regularly check to see if handrails are loose or broken. Make sure that both sides of any steps have handrails. Any raised decks and porches should have guardrails on the edges. Have any leaves, snow, or ice cleared regularly. Use sand or salt on walking paths  during winter. Clean up any spills in your garage right away. This includes oil or grease spills. What can I do in the bathroom? Use night lights. Install grab bars by the toilet and in the tub and shower. Do not use towel bars as grab bars. Use non-skid mats or decals in the tub or shower. If you need to sit down in the shower, use a plastic, non-slip stool. Keep the floor dry. Clean up any water that spills on the floor as soon as it happens. Remove soap buildup in the tub or shower regularly. Attach bath mats securely with double-sided non-slip rug tape. Do not have throw rugs and other things on the floor that can make you trip. What can I do in the bedroom? Use night lights. Make sure that you have a light by your bed that is easy to reach. Do not use any sheets or blankets that are too big for your bed. They should not hang down onto the floor. Have a firm chair that has side arms. You can use this for support while you get dressed. Do not have throw rugs and other things on the floor that  can make you trip. What can I do in the kitchen? Clean up any spills right away. Avoid walking on wet floors. Keep items that you use a lot in easy-to-reach places. If you need to reach something above you, use a strong step stool that has a grab bar. Keep electrical cords out of the way. Do not use floor polish or wax that makes floors slippery. If you must use wax, use non-skid floor wax. Do not have throw rugs and other things on the floor that can make you trip. What can I do with my stairs? Do not leave any items on the stairs. Make sure that there are handrails on both sides of the stairs and use them. Fix handrails that are broken or loose. Make sure that handrails are as long as the stairways. Check any carpeting to make sure that it is firmly attached to the stairs. Fix any carpet that is loose or worn. Avoid having throw rugs at the top or bottom of the stairs. If you do have throw  rugs, attach them to the floor with carpet tape. Make sure that you have a light switch at the top of the stairs and the bottom of the stairs. If you do not have them, ask someone to add them for you. What else can I do to help prevent falls? Wear shoes that: Do not have high heels. Have rubber bottoms. Are comfortable and fit you well. Are closed at the toe. Do not wear sandals. If you use a stepladder: Make sure that it is fully opened. Do not climb a closed stepladder. Make sure that both sides of the stepladder are locked into place. Ask someone to hold it for you, if possible. Clearly mark and make sure that you can see: Any grab bars or handrails. First and last steps. Where the edge of each step is. Use tools that help you move around (mobility aids) if they are needed. These include: Canes. Walkers. Scooters. Crutches. Turn on the lights when you go into a dark area. Replace any light bulbs as soon as they burn out. Set up your furniture so you have a clear path. Avoid moving your furniture around. If any of your floors are uneven, fix them. If there are any pets around you, be aware of where they are. Review your medicines with your doctor. Some medicines can make you feel dizzy. This can increase your chance of falling. Ask your doctor what other things that you can do to help prevent falls. This information is not intended to replace advice given to you by your health care provider. Make sure you discuss any questions you have with your health care provider. Document Released: 03/23/2009 Document Revised: 11/02/2015 Document Reviewed: 07/01/2014 Elsevier Interactive Patient Education  2017 [employer].

## 2022-03-22 NOTE — Progress Notes (Signed)
Subjective:   Erica Mills is a 66 y.o. female who presents for an Initial Medicare Annual Wellness Visit.   I [name] on 03/22/22 by a audio enabled telemedicine application and verified that I am speaking with the correct person using two identifiers.  Patient Location: Home  Provider Location: Office/Clinic  I discussed the limitations of evaluation and management by telemedicine. The patient expressed understanding and agreed to proceed.  Review of Systems     Cardiac Risk Factors include: advanced age (>26mn, >>1women);diabetes mellitus;hypertension     Objective:    Today's Vitals   03/22/22 0957  Weight: 176 lb (79.8 kg)  Height: '5\' 3"'$  (1.6 m)   Body mass index is 31.18 kg/m.     03/22/2022   10:04 AM  Advanced Directives  Does Patient Have a Medical Advance Directive? No  Would patient like information on creating a medical advance directive? No - Patient declined    Current Medications (verified) Outpatient Encounter Medications as of 03/22/2022  Medication Sig   amLODipine (NORVASC) 10 MG tablet TAKE 1 TABLET BY MOUTH EVERY DAY   aspirin 81 MG tablet Take 81 mg by mouth daily.   atorvastatin (LIPITOR) 80 MG tablet TAKE 1 TABLET BY MOUTH EVERY DAY   BIOTIN PO Take 1 tablet by mouth daily at 6 (six) AM.   cetirizine (ZYRTEC) 10 MG tablet Take 1 tablet (10 mg total) by mouth daily.   Cholecalciferol (VITAMIN D3 PO) Take 1 tablet by mouth daily at 6 (six) AM.   levothyroxine (SYNTHROID) 50 MCG tablet TAKE 1 TABLET BY MOUTH EVERY DAY   metFORMIN (GLUCOPHAGE-XR) 500 MG 24 hr tablet TAKE 1 TABLET BY MOUTH EVERY DAY WITH BREAKFAST   neomycin-polymyxin-hydrocortisone (CORTISPORIN) OTIC solution Place 4 drops into both ears 4 (four) times daily.   sertraline (ZOLOFT) 50 MG tablet TAKE 1 TABLET BY MOUTH EVERY DAY   diclofenac sodium (VOLTAREN) 1 % GEL Apply 2 g topically 4 (four) times daily as needed. (Patient not taking: Reported on  03/22/2022)   gabapentin (NEURONTIN) 100 MG capsule Take 1 capsule (100 mg total) by mouth at bedtime. (Patient not taking: Reported on 03/22/2022)   lidocaine (LIDODERM) 5 % Place 1 patch onto the skin daily. Remove & Discard patch within 12 hours or as directed by MD (Patient not taking: Reported on 03/22/2022)   Multiple Vitamins-Minerals (ZINC PO) Take 1 tablet by mouth daily at 6 (six) AM. (Patient not taking: Reported on 03/22/2022)   predniSONE (DELTASONE) 20 MG tablet Take 2 tablets (40 mg total) by mouth daily with breakfast. (Patient not taking: Reported on 03/22/2022)   Semaglutide (RYBELSUS) 3 MG TABS Take 3 mg by mouth daily. (Patient not taking: Reported on 03/22/2022)   triamcinolone cream (KENALOG) 0.1 % Apply 1 application topically 4 (four) times daily. As needed for rash (Patient not taking: Reported on 03/22/2022)   No facility-administered encounter medications on file as of 03/22/2022.    Allergies (verified) Crestor [rosuvastatin] and Sulfonamide derivatives   History: Past Medical History:  Diagnosis Date   ACHILLES TENDINITIS 10/06/2009   ALLERGIC RHINITIS 05/19/2007   CARPAL TUNNEL SYNDROME, BILATERAL 05/19/2007   Cyst of breast, right, solitary 07/11/2012   Diabetes mellitus without complication ([facility]    on meds   ELBOW PAIN, RIGHT 04/21/2008   Fibroids    GERD (gastroesophageal reflux disease)    with certain foods/OTC meds   GLUCOSE INTOLERANCE 02/14/2009   H/O menorrhagia  HYPERLIPIDEMIA 05/19/2007   on meds   HYPERTENSION 05/19/2007   on meds   HYPERTHYROIDISM 03/10/2009   on meds   Lateral epicondylitis  of elbow 04/21/2008   Seasonal allergies    SINUSITIS- ACUTE-NOS 05/11/2010   SMOKER 05/19/2007   Past Surgical History:  Procedure Laterality Date   CESAREAN SECTION     COLONOSCOPY  2012   CG-F/V-movi (exc)-tics-10 yr recall   TONSILLECTOMY     Family History  Problem Relation Age of Onset   Hypertension Mother    Diabetes Mother     Diabetes Father    Hypertension Father    Diabetes Sister    Cancer Paternal Uncle        colon cancer   Colon cancer Neg Hx    Colon polyps Neg Hx    Esophageal cancer Neg Hx    Rectal cancer Neg Hx    Stomach cancer Neg Hx    Social History   Socioeconomic History   Marital status: Widowed    Spouse name: Not on file   Number of children: Not on file   Years of education: Not on file   Highest education level: Not on file  Occupational History   Not on file  Tobacco Use   Smoking status: Former    Types: Cigarettes    Quit date: 06/10/2012    Years since quitting: 9.7   Smokeless tobacco: Never   Tobacco comments:    QUIT SMOKING X 4 WEEKS  Vaping Use   Vaping Use: Never used  Substance and Sexual Activity   Alcohol use: No   Drug use: No   Sexual activity: Yes    Birth control/protection: Surgical, Post-menopausal    Comment: BTL  Other Topics Concern   Not on file  Social History Narrative   Not on file   Social Determinants of Health   Financial Resource Strain: Low Risk  (03/22/2022)   Overall Financial Resource Strain (CARDIA)    Difficulty of Paying Living Expenses: Not hard at all  Food Insecurity: No Food Insecurity (03/22/2022)   Hunger Vital Sign    Worried About Running Out of Food in the Last Year: Never true    [place] in the Last Year: Never true  Transportation Needs: No Transportation Needs (03/22/2022)   PRAPARE - Hydrologist (Medical): No    Lack of Transportation (Non-Medical): No  Physical Activity: Sufficiently Active (03/22/2022)   Exercise Vital Sign    Days of Exercise per Week: 5 days    Minutes of Exercise per Session: 60 min  Stress: No Stress Concern Present (03/22/2022)   [place]    Feeling of Stress : Not at all  Social Connections: Moderately Integrated (03/22/2022)   Social Connection and Isolation Panel [NHANES]     Frequency of Communication with Friends and Family: More than three times a week    Frequency of Social Gatherings with Friends and Family: More than three times a week    Attends Religious Services: More than 4 times per year    Active Member of [employer] or Organizations: Yes    Attends Archivist Meetings: More than 4 times per year    Marital Status: Widowed    Tobacco Counseling Counseling given: Not Answered Tobacco comments: QUIT SMOKING X 4 WEEKS   Clinical Intake:  Pre-visit preparation completed: Yes  Pain : No/denies pain  Nutritional Risks: None Diabetes: No  How often do you need to have someone help you when you read instructions, pamphlets, or other written materials from your doctor or pharmacy?: 1 - Never  Diabetic?yes Nutrition Risk Assessment:  Has the patient had any N/V/D within the last 2 months?  No  Does the patient have any non-healing wounds?  No  Has the patient had any unintentional weight loss or weight gain?  No   Diabetes:  Is the patient diabetic?  Yes  If diabetic, was a CBG obtained today?  No  Did the patient bring in their glucometer from home?  No  How often do you monitor your CBG's? Never .   Financial Strains and Diabetes Management:  Are you having any financial strains with the device, your supplies or your medication? No .  Does the patient want to be seen by Chronic Care Management for management of their diabetes?  No  Would the patient like to be referred to a Nutritionist or for Diabetic Management?  No   Diabetic Exams:  Diabetic Eye Exam: Completed 03/2022 Diabetic Foot Exam: Overdue, Pt has been advised about the importance in completing this exam. Pt is scheduled for diabetic foot exam on next office visit .   Interpreter Needed?: No  Information entered by :: Jadene Pierini, LPN   Activities of Daily Living    03/22/2022   10:03 AM  In your present state of health, do you have any difficulty  performing the following activities:  Hearing? 0  Vision? 0  Difficulty concentrating or making decisions? 0  Walking or climbing stairs? 0  Dressing or bathing? 0  Doing errands, shopping? 0  Preparing Food and eating ? N  Using the Toilet? N  In the past six months, have you accidently leaked urine? N  Do you have problems with loss of bowel control? N  Managing your Medications? N  Managing your Finances? N  Housekeeping or managing your Housekeeping? N    Patient Care Team: Biagio Borg, MD as PCP - General  Indicate any recent Medical Services you may have received from other than [facility] providers in the past year (date may be approximate).     Assessment:   This is a routine wellness examination for Erica Mills.  Hearing/Vision screen Vision Screening - Comments:: Annual eye exams   Dietary issues and exercise activities discussed: Current Exercise Habits: Home exercise routine, Type of exercise: walking, Time (Minutes): 60, Frequency (Times/Week): 5, Weekly Exercise (Minutes/Week): 300, Intensity: Mild, Exercise limited by: orthopedic condition(s)   Goals Addressed             This Visit's Progress    DIET - INCREASE WATER INTAKE         Depression Screen    11/09/2021    9:41 AM 11/09/2021    9:05 AM 11/07/2021    1:49 PM 05/11/2021    9:49 AM 07/14/2020    9:27 AM 12/27/2019    1:29 PM 06/29/2019    1:34 PM  PHQ 2/9 Scores  PHQ - 2 Score 0 0 0 0 0 0 1  PHQ- 9 Score  0 4  0      Fall Risk    03/22/2022    9:59 AM 11/09/2021    9:41 AM 11/09/2021    9:05 AM 11/07/2021    1:53 PM 05/11/2021    9:48 AM  Fall Risk   Falls in the past year? 0 1 0 1 0  Number falls in past yr: 0 0 0 0 0  Injury with Fall? 0 0 0 0 0  Risk for fall due to : No Fall Risks   No Fall Risks   Follow up Falls prevention discussed   Falls evaluation completed     FALL RISK PREVENTION PERTAINING TO THE HOME:  Any stairs in or around the home? Yes  If so, are there any without handrails? No   Home free of loose throw rugs in walkways, pet beds, electrical cords, etc? Yes  Adequate lighting in your home to reduce risk of falls? Yes   ASSISTIVE DEVICES UTILIZED TO PREVENT FALLS:  Life alert? No  Use of a cane, walker or w/c? No  Grab bars in the bathroom? No  Shower chair or bench in shower? No  Elevated toilet seat or a handicapped toilet? No          03/22/2022   10:04 AM  6CIT Screen  What Year? 0 points  What month? 0 points  What time? 0 points  Count back from 20 0 points  Months in reverse 0 points  Repeat phrase 0 points  Total Score 0 points    Immunizations Immunization History  Administered Date(s) Administered   Fluad Quad(high Dose 65+) 07/14/2020   Influenza Split 04/02/2013   Influenza Whole 04/13/2007   Influenza, Quadrivalent, Recombinant, Inj, Pf 02/22/2019   Influenza,inj,Quad PF,6+ Mos 03/05/2014, 03/24/2015, 05/17/2016, 03/08/2017   Influenza-Unspecified 03/05/2014, 03/24/2015, 05/17/2016, 02/25/2019   Moderna Sars-Covid-2 Vaccination 09/07/2019, 10/08/2019   PFIZER(Purple Top)SARS-COV-2 Vaccination 04/10/2020   PPD Test 03/04/2017   Pneumococcal Conjugate-13 05/31/2016   Pneumococcal Polysaccharide-23 09/09/2014, 10/20/2020   Td 02/14/2009   Tdap 06/29/2019    TDAP status: Up to date  Flu Vaccine status: Due, Education has been provided regarding the importance of this vaccine. Advised may receive this vaccine at local pharmacy or Health Dept. Aware to provide a copy of the vaccination record if obtained from local pharmacy or Health Dept. Verbalized acceptance and understanding.  Pneumococcal vaccine status: Up to date  Covid-19 vaccine status: Completed vaccines  Qualifies for Shingles Vaccine? Yes   Zostavax completed No   Shingrix Completed?: No.    Education has been provided regarding the importance of this vaccine. Patient has been advised to call insurance company to determine out of pocket expense if they have not yet  received this vaccine. Advised may also receive vaccine at local pharmacy or Health Dept. Verbalized acceptance and understanding.  Screening Tests Health Maintenance  Topic Date Due   Zoster Vaccines- Shingrix (1 of 2) Never done   COVID-19 Vaccine (4 - Mixed Product risk series) 06/05/2020   OPHTHALMOLOGY EXAM  08/26/2020   Diabetic kidney evaluation - Urine ACR  10/23/2021   HEMOGLOBIN A1C  11/09/2021   INFLUENZA VACCINE  01/08/2022   Diabetic kidney evaluation - GFR measurement  05/11/2022   FOOT EXAM  11/10/2022   MAMMOGRAM  01/26/2023   TETANUS/TDAP  06/28/2029   COLONOSCOPY (Pts 45-50yr Insurance coverage will need to be confirmed)  01/30/2031   Pneumonia Vaccine 66 Years old  Completed   DEXA SCAN  Completed   Hepatitis C Screening  Completed   HPV VACCINES  Aged Out    Health Maintenance  Health Maintenance Due  Topic Date Due   Zoster Vaccines- Shingrix (1 of 2) Never done   COVID-19 Vaccine (4 - Mixed Product risk series) 06/05/2020   OPHTHALMOLOGY EXAM  08/26/2020   Diabetic kidney evaluation - Urine  ACR  10/23/2021   HEMOGLOBIN A1C  11/09/2021   INFLUENZA VACCINE  01/08/2022    Colorectal cancer screening: Type of screening: Colonoscopy. Completed 01/29/2021. Repeat every 10 years  Mammogram status: Completed 01/25/2022. Repeat every year  Bone Density status: Completed 11/16/2021. Results reflect: Bone density results: OSTEOPENIA. Repeat every 5 years.  Lung Cancer Screening: (Low Dose CT Chest recommended if Age 28-80 years, 30 pack-year currently smoking OR have quit w/in 15years.) does not qualify.   Lung Cancer Screening Referral: n/a  Additional Screening:  Hepatitis C Screening: does not qualify;   Vision Screening: Recommended annual ophthalmology exams for early detection of glaucoma and other disorders of the eye. Is the patient up to date with their annual eye exam?  Yes  Who is the provider or what is the name of the office in which the  patient attends annual eye exams? Dr Katy Fitch  If pt is not established with a provider, would they like to be referred to a provider to establish care? No .   Dental Screening: Recommended annual dental exams for proper oral hygiene  Community Resource Referral / Chronic Care Management: CRR required this visit?  No   CCM required this visit?  No      Plan:     I have personally reviewed and noted the following in the patient's chart:   Medical and social history Use of alcohol, tobacco or illicit drugs  Current medications and supplements including opioid prescriptions. Patient is not currently taking opioid prescriptions. Functional ability and status Nutritional status Physical activity Advanced directives List of other physicians Hospitalizations, surgeries, and ER visits in previous 12 months Vitals Screenings to include cognitive, depression, and falls Referrals and appointments  In addition, I have reviewed and discussed with patient certain preventive protocols, quality metrics, and best practice recommendations. A written personalized care plan for preventive services as well as general preventive health recommendations were provided to patient.     Daphane Shepherd, LPN   [id number]   Nurse Notes: Due Flu Vaccine

## 2022-03-28 ENCOUNTER — Other Ambulatory Visit: Payer: Self-pay | Admitting: Family Medicine

## 2022-03-28 DIAGNOSIS — M5442 Lumbago with sciatica, left side: Secondary | ICD-10-CM

## 2022-04-23 DIAGNOSIS — H2513 Age-related nuclear cataract, bilateral: Secondary | ICD-10-CM | POA: Diagnosis not present

## 2022-04-23 DIAGNOSIS — H5202 Hypermetropia, left eye: Secondary | ICD-10-CM | POA: Diagnosis not present

## 2022-04-23 DIAGNOSIS — H52223 Regular astigmatism, bilateral: Secondary | ICD-10-CM | POA: Diagnosis not present

## 2022-04-23 DIAGNOSIS — H5211 Myopia, right eye: Secondary | ICD-10-CM | POA: Diagnosis not present

## 2022-04-23 DIAGNOSIS — H04123 Dry eye syndrome of bilateral lacrimal glands: Secondary | ICD-10-CM | POA: Diagnosis not present

## 2022-04-23 DIAGNOSIS — E119 Type 2 diabetes mellitus without complications: Secondary | ICD-10-CM | POA: Diagnosis not present

## 2022-04-23 DIAGNOSIS — H35033 Hypertensive retinopathy, bilateral: Secondary | ICD-10-CM | POA: Diagnosis not present

## 2022-04-23 DIAGNOSIS — H524 Presbyopia: Secondary | ICD-10-CM | POA: Diagnosis not present

## 2022-04-23 LAB — HM DIABETES EYE EXAM

## 2022-05-03 ENCOUNTER — Other Ambulatory Visit: Payer: Medicare HMO

## 2022-05-07 ENCOUNTER — Other Ambulatory Visit (INDEPENDENT_AMBULATORY_CARE_PROVIDER_SITE_OTHER): Payer: Medicare HMO

## 2022-05-07 DIAGNOSIS — E538 Deficiency of other specified B group vitamins: Secondary | ICD-10-CM | POA: Diagnosis not present

## 2022-05-07 DIAGNOSIS — E119 Type 2 diabetes mellitus without complications: Secondary | ICD-10-CM | POA: Diagnosis not present

## 2022-05-07 DIAGNOSIS — E559 Vitamin D deficiency, unspecified: Secondary | ICD-10-CM | POA: Diagnosis not present

## 2022-05-07 LAB — LIPID PANEL
Cholesterol: 167 mg/dL (ref 0–200)
HDL: 59.6 mg/dL (ref >39.00)
LDL Cholesterol: 93 mg/dL (ref 0–99)
NonHDL: 107.06
Total CHOL/HDL Ratio: 3
Triglycerides: 68 mg/dL (ref 0.0–149.0)
VLDL: 13.6 mg/dL (ref 0.0–40.0)

## 2022-05-07 LAB — VITAMIN D 25 HYDROXY (VIT D DEFICIENCY, FRACTURES): VITD: 40.32 ng/mL (ref 30.00–100.00)

## 2022-05-07 LAB — BASIC METABOLIC PANEL
BUN: 17 mg/dL (ref 6–23)
CO2: 29 mEq/L (ref 19–32)
Calcium: 9.1 mg/dL (ref 8.4–10.5)
Chloride: 108 mEq/L (ref 96–112)
Creatinine, Ser: 0.81 mg/dL (ref 0.40–1.20)
GFR: 75.43 mL/min (ref >60.00)
Glucose, Bld: 117 mg/dL — ABNORMAL HIGH (ref 70–99)
Potassium: 3.4 mEq/L — ABNORMAL LOW (ref 3.5–5.1)
Sodium: 142 mEq/L (ref 135–145)

## 2022-05-07 LAB — HEPATIC FUNCTION PANEL
ALT: 21 U/L (ref 0–35)
AST: 21 U/L (ref 0–37)
Albumin: 4.2 g/dL (ref 3.5–5.2)
Alkaline Phosphatase: 71 U/L (ref 39–117)
Bilirubin, Direct: 0.1 mg/dL (ref 0.0–0.3)
Total Bilirubin: 0.4 mg/dL (ref 0.2–1.2)
Total Protein: 7 g/dL (ref 6.0–8.3)

## 2022-05-07 LAB — VITAMIN B12: Vitamin B-12: 375 pg/mL (ref 211–911)

## 2022-05-07 LAB — HEMOGLOBIN A1C: Hgb A1c MFr Bld: 6.7 % — ABNORMAL HIGH (ref 4.6–6.5)

## 2022-05-10 ENCOUNTER — Ambulatory Visit (INDEPENDENT_AMBULATORY_CARE_PROVIDER_SITE_OTHER): Payer: Medicare HMO | Admitting: Internal Medicine

## 2022-05-10 VITALS — BP 120/66 | HR 81 | Temp 97.7°F | Ht 63.0 in | Wt 178.0 lb

## 2022-05-10 DIAGNOSIS — I10 Essential (primary) hypertension: Secondary | ICD-10-CM | POA: Diagnosis not present

## 2022-05-10 DIAGNOSIS — E7800 Pure hypercholesterolemia, unspecified: Secondary | ICD-10-CM

## 2022-05-10 DIAGNOSIS — E538 Deficiency of other specified B group vitamins: Secondary | ICD-10-CM

## 2022-05-10 DIAGNOSIS — E1165 Type 2 diabetes mellitus with hyperglycemia: Secondary | ICD-10-CM | POA: Diagnosis not present

## 2022-05-10 DIAGNOSIS — E559 Vitamin D deficiency, unspecified: Secondary | ICD-10-CM | POA: Diagnosis not present

## 2022-05-10 DIAGNOSIS — Z23 Encounter for immunization: Secondary | ICD-10-CM | POA: Diagnosis not present

## 2022-05-10 MED ORDER — EZETIMIBE 10 MG PO TABS: 10.0000 mg | ORAL_TABLET | Freq: Every day | ORAL | 3 refills | Status: DC

## 2022-05-10 NOTE — Progress Notes (Unsigned)
Patient ID: Erica Mills, female   DOB: 1956-02-04, 66 y.o.   MRN: [id number]        Chief Complaint: follow up low b12, hld, htn, DM       HPI:  Erica Mills is a 66 y.o. female here overall doing well, Pt denies chest pain, increased sob or doe, wheezing, orthopnea, PND, increased LE swelling, palpitations, dizziness or syncope.   Pt denies polydipsia, polyuria, or new focal neuro s/s.    Pt denies fever, wt loss, night sweats, loss of appetite, or other constitutional symptoms  Has been trying to follow lower chol diet.  Not taking B12 recently though did in the past after a low value.  Due for flu shot, plans for shingrix at pharmacy.       Wt Readings from Last 3 Encounters:  05/10/22 178 lb (80.7 kg)  03/22/22 176 lb (79.8 kg)  11/09/21 180 lb (81.6 kg)   BP Readings from Last 3 Encounters:  05/10/22 120/66  11/09/21 122/88  11/07/21 130/82         Past Medical History:  Diagnosis Date   ACHILLES TENDINITIS 10/06/2009   ALLERGIC RHINITIS 05/19/2007   CARPAL TUNNEL SYNDROME, BILATERAL 05/19/2007   Cyst of breast, right, solitary 07/11/2012   Diabetes mellitus without complication ([employer])    on meds   ELBOW PAIN, RIGHT 04/21/2008   Fibroids    GERD (gastroesophageal reflux disease)    with certain foods/OTC meds   GLUCOSE INTOLERANCE 02/14/2009   H/O menorrhagia    HYPERLIPIDEMIA 05/19/2007   on meds   HYPERTENSION 05/19/2007   on meds   HYPERTHYROIDISM 03/10/2009   on meds   Lateral epicondylitis  of elbow 04/21/2008   Seasonal allergies    SINUSITIS- ACUTE-NOS 05/11/2010   SMOKER 05/19/2007   Past Surgical History:  Procedure Laterality Date   CESAREAN SECTION     COLONOSCOPY  2012   CG-F/V-movi (exc)-tics-10 yr recall   TONSILLECTOMY      reports that she quit smoking about 9 years ago. Her smoking use included cigarettes. She has never used smokeless tobacco. She reports that she does not drink alcohol and does not use drugs. family history includes Cancer in  her paternal uncle; Diabetes in her father, mother, and sister; Hypertension in her father and mother. Allergies  Allergen Reactions   Crestor [Rosuvastatin] Other (See Comments)    myalgia   Sulfonamide Derivatives     REACTION: rash   Current Outpatient Medications on File Prior to Visit  Medication Sig Dispense Refill   amLODipine (NORVASC) 10 MG tablet TAKE 1 TABLET BY MOUTH EVERY DAY 90 tablet 3   aspirin 81 MG tablet Take 81 mg by mouth daily.     atorvastatin (LIPITOR) 80 MG tablet TAKE 1 TABLET BY MOUTH EVERY DAY 90 tablet 3   BIOTIN PO Take 1 tablet by mouth daily at 6 (six) AM.     cetirizine (ZYRTEC) 10 MG tablet Take 1 tablet (10 mg total) by mouth daily. 30 tablet 11   Cholecalciferol (VITAMIN D3 PO) Take 1 tablet by mouth daily at 6 (six) AM.     gabapentin (NEURONTIN) 100 MG capsule TAKE 1 CAPSULE BY MOUTH AT BEDTIME. 30 capsule 2   levothyroxine (SYNTHROID) 50 MCG tablet TAKE 1 TABLET BY MOUTH EVERY DAY 90 tablet 3   metFORMIN (GLUCOPHAGE-XR) 500 MG 24 hr tablet TAKE 1 TABLET BY MOUTH EVERY DAY WITH BREAKFAST 90 tablet 3   neomycin-polymyxin-hydrocortisone (CORTISPORIN) OTIC solution Place  4 drops into both ears 4 (four) times daily. 10 mL 2   sertraline (ZOLOFT) 50 MG tablet TAKE 1 TABLET BY MOUTH EVERY DAY 90 tablet 2   diclofenac sodium (VOLTAREN) 1 % GEL Apply 2 g topically 4 (four) times daily as needed. (Patient not taking: Reported on 03/22/2022) 200 g 3   lidocaine (LIDODERM) 5 % Place 1 patch onto the skin daily. Remove & Discard patch within 12 hours or as directed by MD (Patient not taking: Reported on 03/22/2022) 40 patch 2   Multiple Vitamins-Minerals (ZINC PO) Take 1 tablet by mouth daily at 6 (six) AM. (Patient not taking: Reported on 03/22/2022)     triamcinolone cream (KENALOG) 0.1 % Apply 1 application topically 4 (four) times daily. As needed for rash (Patient not taking: Reported on 03/22/2022) 30 g 0   No current facility-administered medications on file  prior to visit.        ROS:  All others reviewed and negative.  Objective        PE:  BP 120/66 (BP Location: Right Arm, Patient Position: Sitting, Cuff Size: Large)   Pulse 81   Temp 97.7 F (36.5 C) (Oral)   Ht '5\' 3"'$  (1.6 m)   Wt 178 lb (80.7 kg)   SpO2 94%   BMI 31.53 kg/m                 Constitutional: Pt appears in NAD               HENT: Head: NCAT.                Right Ear: External ear normal.                 Left Ear: External ear normal.                Eyes: . Pupils are equal, round, and reactive to light. Conjunctivae and EOM are normal               Nose: without d/c or deformity               Neck: Neck supple. Gross normal ROM               Cardiovascular: Normal rate and regular rhythm.                 Pulmonary/Chest: Effort normal and breath sounds without rales or wheezing.                Abd:  Soft, NT, ND, + BS, no organomegaly               Neurological: Pt is alert. At baseline orientation, motor grossly intact               Skin: Skin is warm. No rashes, no other new lesions, LE edema - none               Psychiatric: Pt behavior is normal without agitation   Micro: none  Cardiac tracings I have personally interpreted today:  none  Pertinent Radiological findings (summarize): none   Lab Results  Component Value Date   WBC 9.7 10/23/2020   HGB 12.6 10/23/2020   HCT 37.7 10/23/2020   PLT 222.0 10/23/2020   GLUCOSE 117 (H) 05/07/2022   CHOL 167 05/07/2022   TRIG 68.0 05/07/2022   HDL 59.60 05/07/2022   LDLDIRECT 153.3 05/19/2007   LDLCALC 93 05/07/2022   ALT 21  05/07/2022   AST 21 05/07/2022   NA 142 05/07/2022   K 3.4 (L) 05/07/2022   CL 108 05/07/2022   CREATININE 0.81 05/07/2022   BUN 17 05/07/2022   CO2 29 05/07/2022   TSH 1.16 10/23/2020   HGBA1C 6.7 (H) 05/07/2022   MICROALBUR 1.0 10/23/2020   Assessment/Plan:  Erica Mills is a 66 y.o. Black or African American [2] female with  has a past medical history of ACHILLES TENDINITIS  (10/06/2009), ALLERGIC RHINITIS (05/19/2007), CARPAL TUNNEL SYNDROME, BILATERAL (05/19/2007), Cyst of breast, right, solitary (07/11/2012), Diabetes mellitus without complication ([place]), ELBOW PAIN, RIGHT (04/21/2008), Fibroids, GERD (gastroesophageal reflux disease), GLUCOSE INTOLERANCE (02/14/2009), H/O menorrhagia, HYPERLIPIDEMIA (05/19/2007), HYPERTENSION (05/19/2007), HYPERTHYROIDISM (03/10/2009), Lateral epicondylitis  of elbow (04/21/2008), Seasonal allergies, SINUSITIS- ACUTE-NOS (05/11/2010), and SMOKER (05/19/2007).  Hyperlipidemia Lab Results  Component Value Date   LDLCALC 93 05/07/2022   Uncontrolled, goal ldl < 70, declines statin, for zetia 10 mg qd, lower chol diet   Essential hypertension BP Readings from Last 3 Encounters:  05/10/22 120/66  11/09/21 122/88  11/07/21 130/82   Stable, pt to continue medical treatment norvasc 10 mg qd   Diabetes Lab Results  Component Value Date   HGBA1C 6.7 (H) 05/07/2022   Stable, pt to continue current medical treatment - metformin ER 500 mg qd   B12 deficiency Lab Results  Component Value Date   VITAMINB12 375 05/07/2022   Low again, for restart oral replacement - b12 1000 mcg qd  Followup: Return in about 6 months (around 11/09/2022).  Cathlean Cower, MD 05/11/2022 2:52 PM [facility]

## 2022-05-10 NOTE — Patient Instructions (Addendum)
You had the flu shot today  Please have your Shingrix (shingles) shots done at your local pharmacy.  Please restart your B12 supplement as before  Please take all new medication as prescribed  -the zetia 10 mg per day for cholesterol  Please continue all other medications as before, and refills have been done if requested.  Please have the pharmacy call with any other refills you may need.  Please keep your appointments with your specialists as you may have planned  Please make an Appointment to return in 6 months, or sooner if needed, also with Lab Appointment for testing done 3-5 days before at the [facility] (so this is for TWO appointments - please see the scheduling desk as you leave)

## 2022-05-11 ENCOUNTER — Encounter: Payer: Self-pay | Admitting: Internal Medicine

## 2022-05-11 DIAGNOSIS — E538 Deficiency of other specified B group vitamins: Secondary | ICD-10-CM | POA: Insufficient documentation

## 2022-05-11 NOTE — Assessment & Plan Note (Signed)
Lab Results  Component Value Date   VITAMINB12 375 05/07/2022   Low again, for restart oral replacement - b12 1000 mcg qd

## 2022-05-11 NOTE — Assessment & Plan Note (Signed)
Lab Results  Component Value Date   LDLCALC 93 05/07/2022   Uncontrolled, goal ldl < 70, declines statin, for zetia 10 mg qd, lower chol diet

## 2022-05-11 NOTE — Assessment & Plan Note (Signed)
BP Readings from Last 3 Encounters:  05/10/22 120/66  11/09/21 122/88  11/07/21 130/82   Stable, pt to continue medical treatment norvasc 10 mg qd

## 2022-05-11 NOTE — Assessment & Plan Note (Signed)
Lab Results  Component Value Date   HGBA1C 6.7 (H) 05/07/2022   Stable, pt to continue current medical treatment - metformin ER 500 mg qd

## 2022-06-12 ENCOUNTER — Telehealth: Payer: Self-pay | Admitting: Internal Medicine

## 2022-06-12 NOTE — Telephone Encounter (Signed)
Err

## 2022-06-22 ENCOUNTER — Telehealth: Payer: Medicare HMO | Admitting: Nurse Practitioner

## 2022-06-22 DIAGNOSIS — J0110 Acute frontal sinusitis, unspecified: Secondary | ICD-10-CM | POA: Diagnosis not present

## 2022-06-22 MED ORDER — AMOXICILLIN-POT CLAVULANATE 875-125 MG PO TABS: 1.0000 | ORAL_TABLET | Freq: Two times a day (BID) | ORAL | 0 refills | Status: DC

## 2022-06-22 MED ORDER — FLUTICASONE PROPIONATE 50 MCG/ACT NA SUSP: 2.0000 | Freq: Every day | NASAL | 6 refills | Status: DC

## 2022-06-22 NOTE — Progress Notes (Signed)
Virtual Visit Consent   Erica Mills, you are scheduled for a virtual visit with a [facility] provider today. Just as with appointments in the office, your consent must be obtained to participate. Your consent will be active for this visit and any virtual visit you may have with one of our providers in the next 365 days. If you have a MyChart account, a copy of this consent can be sent to you electronically.  As this is a virtual visit, video technology does not allow for your provider to perform a traditional examination. This may limit your provider's ability to fully assess your condition. If your provider identifies any concerns that need to be evaluated in person or the need to arrange testing (such as labs, EKG, etc.), we will make arrangements to do so. Although advances in technology are sophisticated, we cannot ensure that it will always work on either your end or our end. If the connection with a video visit is poor, the visit may have to be switched to a telephone visit. With either a video or telephone visit, we are not always able to ensure that we have a secure connection.  By engaging in this virtual visit, you consent to the provision of healthcare and authorize for your insurance to be billed (if applicable) for the services provided during this visit. Depending on your insurance coverage, you may receive a charge related to this service.  I need to obtain your verbal consent now. Are you willing to proceed with your visit today? ASPASIA RUDE has provided verbal consent on 06/22/2022 for a virtual visit (video or telephone). Apolonio Schneiders, FNP  Date: 06/22/2022 5:16 PM  Virtual Visit via Video Note   I, Apolonio Schneiders, [name]  ([id number], 1955-07-24) on 06/22/22 at  5:15 PM EST by a video-enabled telemedicine application and verified that I am speaking with the correct person using two identifiers.  Location: Patient: Virtual Visit Location Patient:  Home Provider: Virtual Visit Location Provider: Home Office   I discussed the limitations of evaluation and management by telemedicine and the availability of in person appointments. The patient expressed understanding and agreed to proceed.    History of Present Illness: Erica Mills is a 67 y.o. who identifies as a female who was assigned female at birth, and is being seen today for congestion and headache with cough for the past 1.5 weeks   Has used Alka Seltzer, Mucinex and then ibuprofen   Today her head congestion is what is bothering her the most  She has not had a fever   Had her flu shot 2 weeks ago.  She has not taken a COVID test   Problems:  Patient Active Problem List   Diagnosis Date Noted   B12 deficiency 05/11/2022   Mammographic calcification found on diagnostic imaging of breast 01/13/2022   Low back pain due to bilateral sciatica 11/11/2021   Polyp of cervix 11/07/2021   Uterine leiomyoma 11/07/2021   Obesity 11/07/2021   Gastroesophageal reflux disease 09/12/2021   Cobalamin deficiency 09/12/2021   Vitamin D deficiency 05/20/2021   Acute on chronic renal insufficiency 07/14/2020   Hypothyroidism 07/14/2020   Abnormal LFTs 07/14/2020   Type 2 diabetes mellitus without complication, without long-term current use of insulin ([place]) 04/06/2019   CKD (chronic kidney disease) stage 3, GFR 30-59 ml/min (HCC) 05/29/2018   Lumbar degenerative disc disease 05/30/2017   Proteinuria 05/30/2017   Right lumbar radiculitis 11/29/2016   Cyst  of left breast 12/22/2015   Itch 11/03/2015   Eustachian tube dysfunction 03/24/2015   Lower back pain 03/24/2015   Rash and nonspecific skin eruption 09/03/2013   Multinodular goiter 12/04/2012   Diabetes ([place]) 02/05/2011   Encounter for well adult exam with abnormal findings 02/05/2011   Hyperlipidemia 05/19/2007   Former smoker 05/19/2007   CARPAL TUNNEL SYNDROME, BILATERAL 05/19/2007   Essential hypertension 05/19/2007    Allergic rhinitis 05/19/2007   Carpal tunnel syndrome, bilateral 05/19/2007    Allergies:  Allergies  Allergen Reactions   Crestor [Rosuvastatin] Other (See Comments)    myalgia   Sulfonamide Derivatives     REACTION: rash   Medications:  Current Outpatient Medications:    amLODipine (NORVASC) 10 MG tablet, TAKE 1 TABLET BY MOUTH EVERY DAY, Disp: 90 tablet, Rfl: 3   aspirin 81 MG tablet, Take 81 mg by mouth daily., Disp: , Rfl:    atorvastatin (LIPITOR) 80 MG tablet, TAKE 1 TABLET BY MOUTH EVERY DAY, Disp: 90 tablet, Rfl: 3   BIOTIN PO, Take 1 tablet by mouth daily at 6 (six) AM., Disp: , Rfl:    cetirizine (ZYRTEC) 10 MG tablet, Take 1 tablet (10 mg total) by mouth daily., Disp: 30 tablet, Rfl: 11   Cholecalciferol (VITAMIN D3 PO), Take 1 tablet by mouth daily at 6 (six) AM., Disp: , Rfl:    diclofenac sodium (VOLTAREN) 1 % GEL, Apply 2 g topically 4 (four) times daily as needed. (Patient not taking: Reported on 03/22/2022), Disp: 200 g, Rfl: 3   ezetimibe (ZETIA) 10 MG tablet, Take 1 tablet (10 mg total) by mouth daily., Disp: 90 tablet, Rfl: 3   gabapentin (NEURONTIN) 100 MG capsule, TAKE 1 CAPSULE BY MOUTH AT BEDTIME., Disp: 30 capsule, Rfl: 2   levothyroxine (SYNTHROID) 50 MCG tablet, TAKE 1 TABLET BY MOUTH EVERY DAY, Disp: 90 tablet, Rfl: 3   lidocaine (LIDODERM) 5 %, Place 1 patch onto the skin daily. Remove & Discard patch within 12 hours or as directed by MD (Patient not taking: Reported on 03/22/2022), Disp: 40 patch, Rfl: 2   metFORMIN (GLUCOPHAGE-XR) 500 MG 24 hr tablet, TAKE 1 TABLET BY MOUTH EVERY DAY WITH BREAKFAST, Disp: 90 tablet, Rfl: 3   Multiple Vitamins-Minerals (ZINC PO), Take 1 tablet by mouth daily at 6 (six) AM. (Patient not taking: Reported on 03/22/2022), Disp: , Rfl:    neomycin-polymyxin-hydrocortisone (CORTISPORIN) OTIC solution, Place 4 drops into both ears 4 (four) times daily., Disp: 10 mL, Rfl: 2   sertraline (ZOLOFT) 50 MG tablet, TAKE 1 TABLET BY MOUTH  EVERY DAY, Disp: 90 tablet, Rfl: 2   triamcinolone cream (KENALOG) 0.1 %, Apply 1 application topically 4 (four) times daily. As needed for rash (Patient not taking: Reported on 03/22/2022), Disp: 30 g, Rfl: 0  Observations/Objective: Patient is well-developed, well-nourished in no acute distress.  Resting comfortably  at home.  Head is normocephalic, atraumatic.  No labored breathing.  Speech is clear and coherent with logical content.  Patient is alert and oriented at baseline.    Assessment and Plan: 1. Acute non-recurrent frontal sinusitis May continue mucinex if needed  - amoxicillin-clavulanate (AUGMENTIN) 875-125 MG tablet; Take 1 tablet by mouth 2 (two) times daily. Take with food  Dispense: 20 tablet; Refill: 0 - fluticasone (FLONASE) 50 MCG/ACT nasal spray; Place 2 sprays into both nostrils daily.  Dispense: 16 g; Refill: 6     Follow Up Instructions: I discussed the assessment and treatment plan with the patient. The patient  was provided an opportunity to ask questions and all were answered. The patient agreed with the plan and demonstrated an understanding of the instructions.  A copy of instructions were sent to the patient via MyChart unless otherwise noted below.    The patient was advised to call back or seek an in-person evaluation if the symptoms worsen or if the condition fails to improve as anticipated.  Time:  I spent 10 minutes with the patient via telehealth technology discussing the above problems/concerns.    Apolonio Schneiders, FNP

## 2022-06-26 ENCOUNTER — Other Ambulatory Visit: Payer: Self-pay

## 2022-06-26 ENCOUNTER — Other Ambulatory Visit: Payer: Self-pay | Admitting: Internal Medicine

## 2022-06-26 NOTE — Telephone Encounter (Signed)
Please refill as per office routine med refill policy (all routine meds to be refilled for 3 mo or monthly (per pt preference) up to one year from last visit, then month to month grace period for 3 mo, then further med refills will have to be denied)

## 2022-07-28 ENCOUNTER — Other Ambulatory Visit: Payer: Self-pay | Admitting: Internal Medicine

## 2022-09-04 ENCOUNTER — Other Ambulatory Visit: Payer: Self-pay | Admitting: Obstetrics and Gynecology

## 2022-09-04 DIAGNOSIS — Z1231 Encounter for screening mammogram for malignant neoplasm of breast: Secondary | ICD-10-CM

## 2022-10-18 DIAGNOSIS — Z6830 Body mass index (BMI) 30.0-30.9, adult: Secondary | ICD-10-CM | POA: Diagnosis not present

## 2022-10-18 DIAGNOSIS — Z1239 Encounter for other screening for malignant neoplasm of breast: Secondary | ICD-10-CM | POA: Diagnosis not present

## 2022-10-18 DIAGNOSIS — Z1211 Encounter for screening for malignant neoplasm of colon: Secondary | ICD-10-CM | POA: Diagnosis not present

## 2022-10-18 DIAGNOSIS — Z13820 Encounter for screening for osteoporosis: Secondary | ICD-10-CM | POA: Diagnosis not present

## 2022-10-18 DIAGNOSIS — Z01419 Encounter for gynecological examination (general) (routine) without abnormal findings: Secondary | ICD-10-CM | POA: Diagnosis not present

## 2022-10-18 DIAGNOSIS — Z124 Encounter for screening for malignant neoplasm of cervix: Secondary | ICD-10-CM | POA: Diagnosis not present

## 2022-11-05 IMAGING — US US ABDOMEN LIMITED
1 series · 14 of 25 positions shown · non-contrast
Comparison: November 03, 2020

CLINICAL DATA: possible GB polyp

EXAM:
ULTRASOUND ABDOMEN LIMITED RIGHT UPPER QUADRANT

[Series 1: us abdomen limited · 0.19mm/px · 14 of 59 slices shown]
[im 1/59]
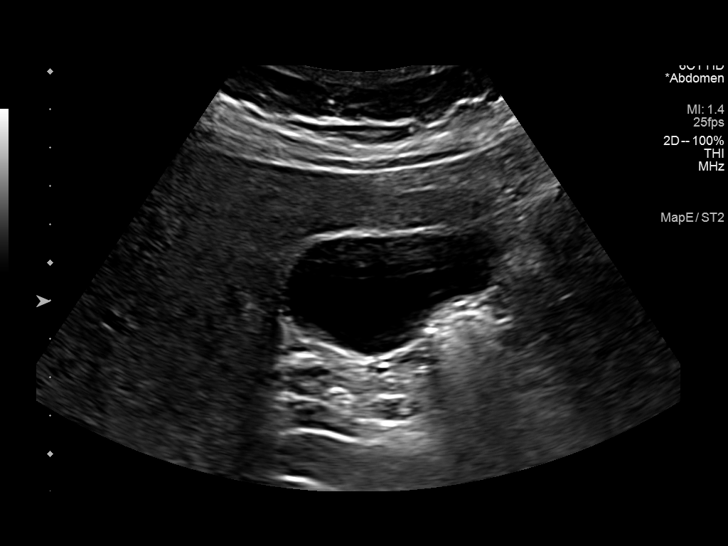
[im 5/59]
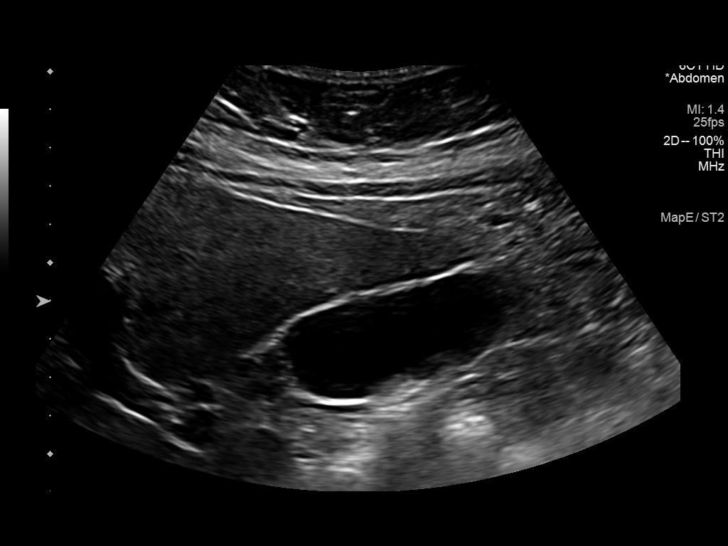
[im 10/59]
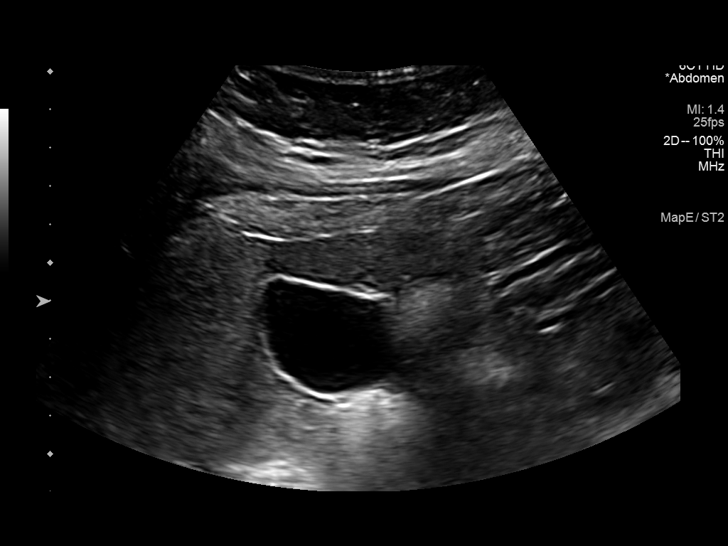
[im 15/59]
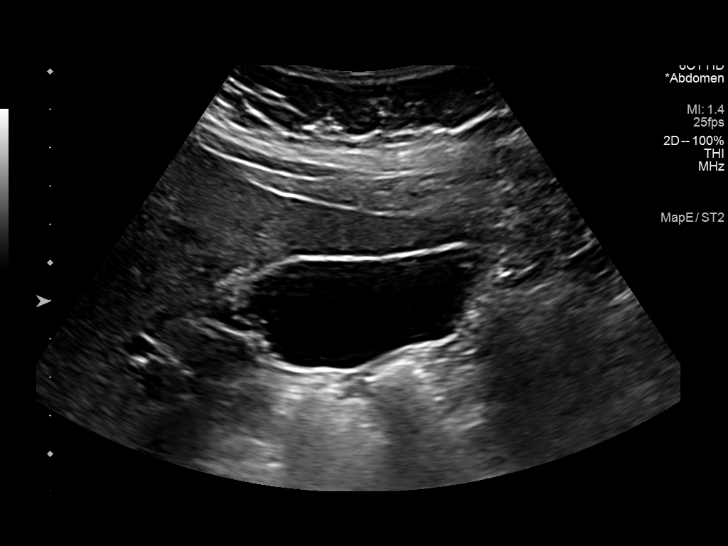
[im 20/59]
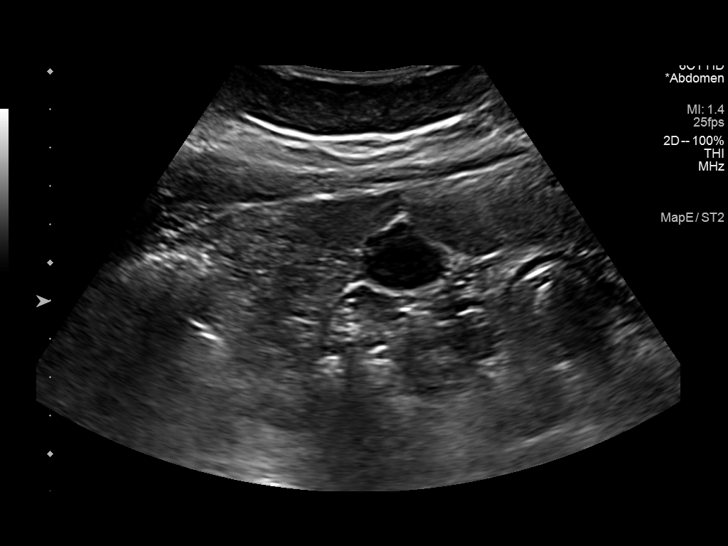
[im 22/59]
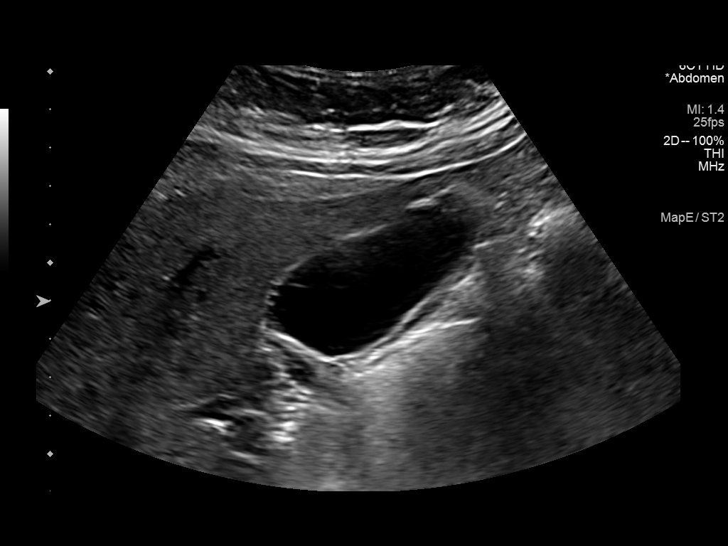
[im 27/59]
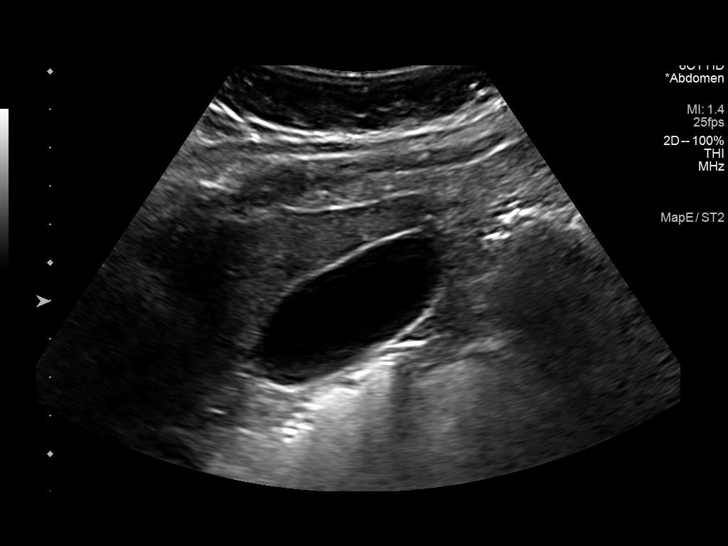
[im 32/59]
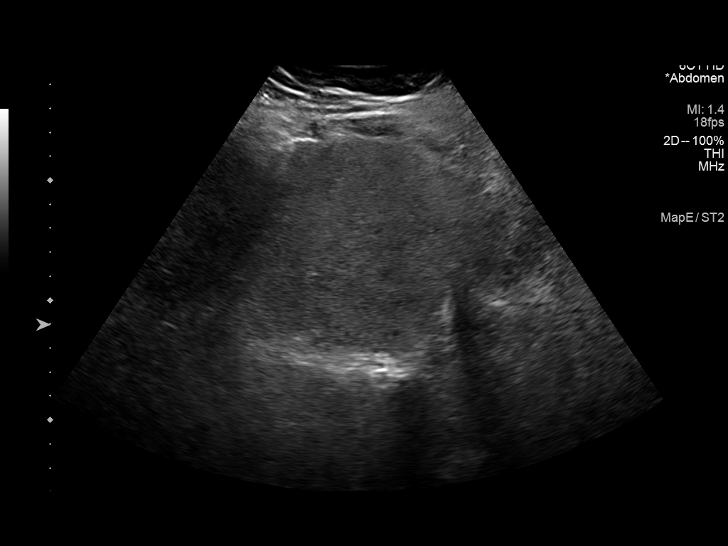
[im 37/59]
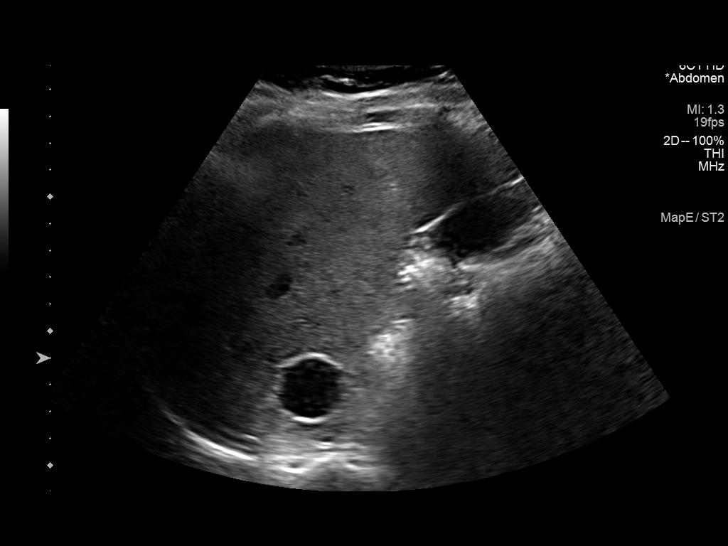
[im 39/59]
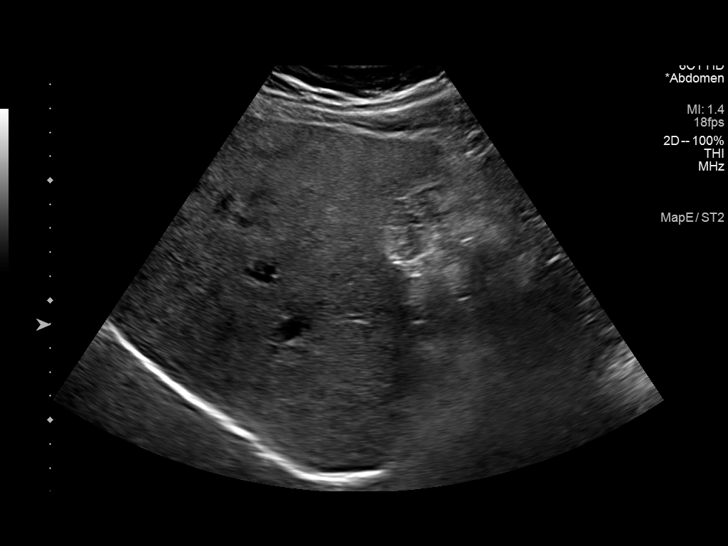
[im 44/59]
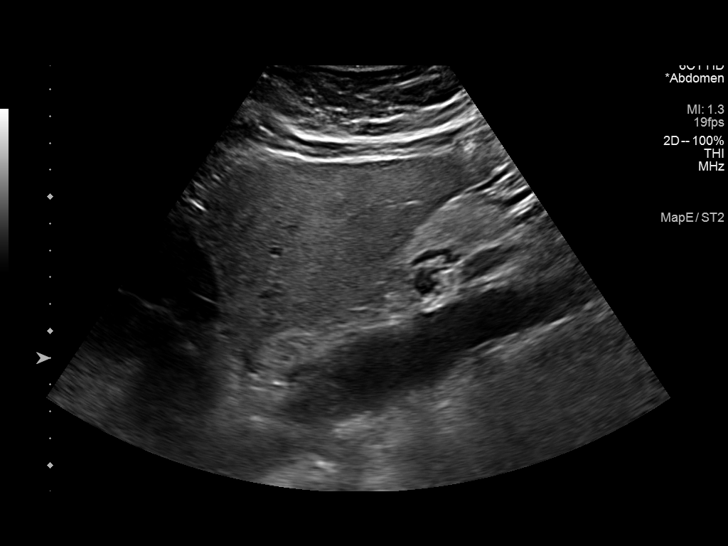
[im 49/59]
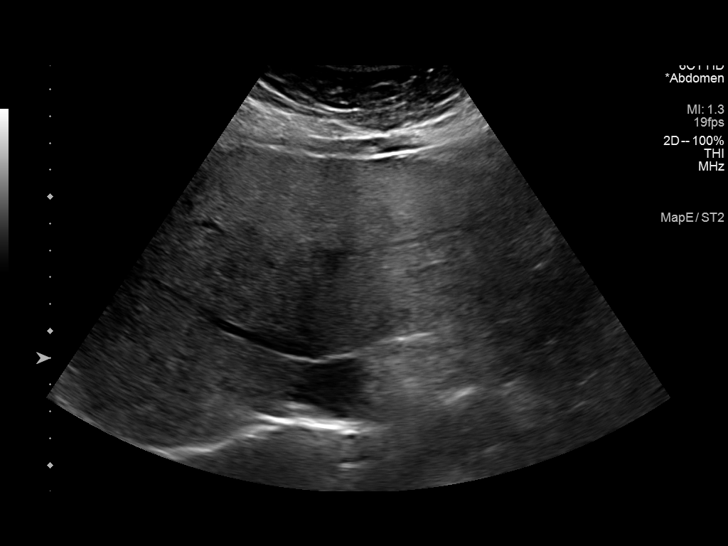
[im 54/59]
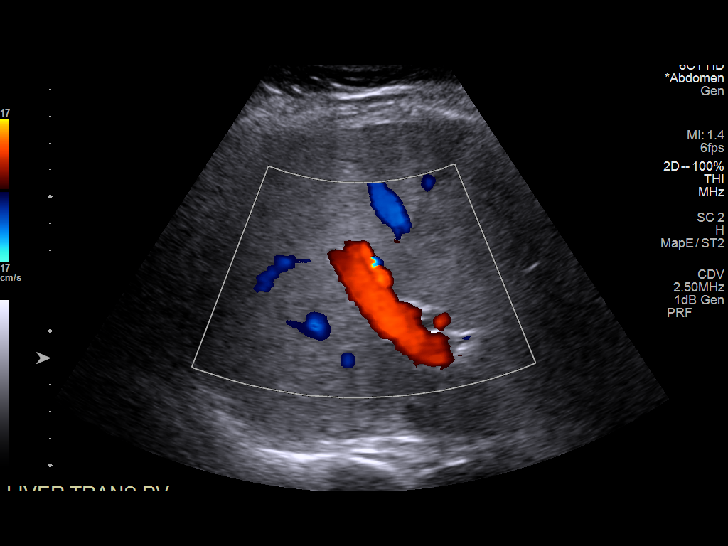
[im 59/59]
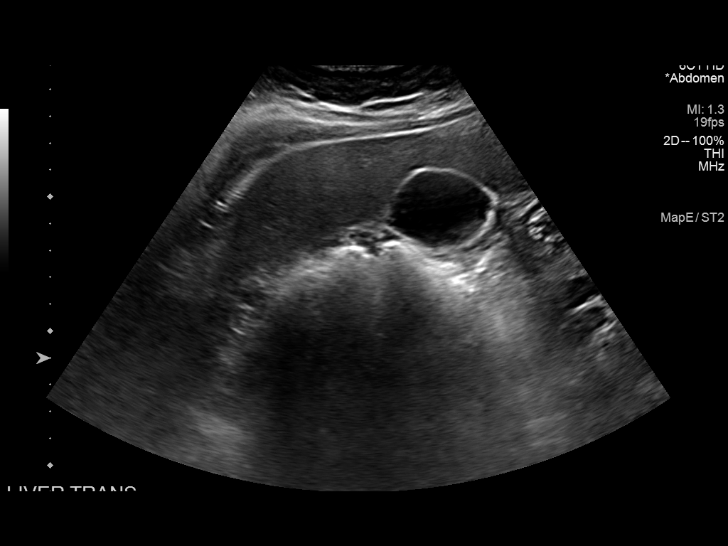

[14 of 25 positions shown; findings below may reference images not displayed]

FINDINGS: Gallbladder:

No gallstones or wall thickening visualized. No sonographic Murphy
sign noted by sonographer. Previously described possible polyp is
not discretely visualized and likely reflects a benign fold in the
fundus.

Common bile duct:

Diameter: Visualized portion measures 6 mm, within normal limits

Liver:

No focal lesion identified. Diffusely increased parenchymal
echogenicity. Portal vein is patent on color Doppler imaging with
normal direction of blood flow towards the liver.

Other: None.
IMPRESSION: 1. Hepatic steatosis
2. Previously described possible polyp is no longer visualized.

## 2022-11-08 ENCOUNTER — Other Ambulatory Visit (INDEPENDENT_AMBULATORY_CARE_PROVIDER_SITE_OTHER): Payer: Medicare HMO

## 2022-11-08 DIAGNOSIS — E559 Vitamin D deficiency, unspecified: Secondary | ICD-10-CM | POA: Diagnosis not present

## 2022-11-08 DIAGNOSIS — E1165 Type 2 diabetes mellitus with hyperglycemia: Secondary | ICD-10-CM

## 2022-11-08 DIAGNOSIS — E538 Deficiency of other specified B group vitamins: Secondary | ICD-10-CM | POA: Diagnosis not present

## 2022-11-08 LAB — HEMOGLOBIN A1C: Hgb A1c MFr Bld: 6.5 % (ref 4.6–6.5)

## 2022-11-08 LAB — BASIC METABOLIC PANEL
BUN: 15 mg/dL (ref 6–23)
CO2: 30 mEq/L (ref 19–32)
Calcium: 9.3 mg/dL (ref 8.4–10.5)
Chloride: 105 mEq/L (ref 96–112)
Creatinine, Ser: 0.88 mg/dL (ref 0.40–1.20)
GFR: 68.04 mL/min (ref >60.00)
Glucose, Bld: 98 mg/dL (ref 70–99)
Potassium: 4.1 mEq/L (ref 3.5–5.1)
Sodium: 140 mEq/L (ref 135–145)

## 2022-11-08 LAB — HEPATIC FUNCTION PANEL
ALT: 24 U/L (ref 0–35)
AST: 22 U/L (ref 0–37)
Albumin: 4.2 g/dL (ref 3.5–5.2)
Alkaline Phosphatase: 78 U/L (ref 39–117)
Bilirubin, Direct: 0.1 mg/dL (ref 0.0–0.3)
Total Bilirubin: 0.4 mg/dL (ref 0.2–1.2)
Total Protein: 7.2 g/dL (ref 6.0–8.3)

## 2022-11-08 LAB — CBC WITH DIFFERENTIAL/PLATELET
Basophils Absolute: 0 10*3/uL (ref 0.0–0.1)
Basophils Relative: 0.6 % (ref 0.0–3.0)
Eosinophils Absolute: 0.2 10*3/uL (ref 0.0–0.7)
Eosinophils Relative: 3.2 % (ref 0.0–5.0)
HCT: 41.4 % (ref 36.0–46.0)
Hemoglobin: 13.4 g/dL (ref 12.0–15.0)
Lymphocytes Relative: 35.8 % (ref 12.0–46.0)
Lymphs Abs: 2.6 10*3/uL (ref 0.7–4.0)
MCHC: 32.4 g/dL (ref 30.0–36.0)
MCV: 87.8 fl (ref 78.0–100.0)
Monocytes Absolute: 0.6 10*3/uL (ref 0.1–1.0)
Monocytes Relative: 8.8 % (ref 3.0–12.0)
Neutro Abs: 3.7 10*3/uL (ref 1.4–7.7)
Neutrophils Relative %: 51.6 % (ref 43.0–77.0)
Platelets: 207 10*3/uL (ref 150.0–400.0)
RBC: 4.71 Mil/uL (ref 3.87–5.11)
RDW: 16.2 % — ABNORMAL HIGH (ref 11.5–15.5)
WBC: 7.1 10*3/uL (ref 4.0–10.5)

## 2022-11-08 LAB — MICROALBUMIN / CREATININE URINE RATIO
Creatinine,U: 89.2 mg/dL
Microalb Creat Ratio: 0.8 mg/g (ref 0.0–30.0)
Microalb, Ur: 0.7 mg/dL (ref 0.0–1.9)

## 2022-11-08 LAB — URINALYSIS, ROUTINE W REFLEX MICROSCOPIC
Bilirubin Urine: NEGATIVE
Hgb urine dipstick: NEGATIVE
Ketones, ur: NEGATIVE
Leukocytes,Ua: NEGATIVE
Nitrite: NEGATIVE
RBC / HPF: NONE SEEN (ref 0–?)
Specific Gravity, Urine: 1.02 (ref 1.000–1.030)
Total Protein, Urine: NEGATIVE
Urine Glucose: NEGATIVE
Urobilinogen, UA: 0.2 (ref 0.0–1.0)
WBC, UA: NONE SEEN (ref 0–?)
pH: 6 (ref 5.0–8.0)

## 2022-11-08 LAB — VITAMIN D 25 HYDROXY (VIT D DEFICIENCY, FRACTURES): VITD: 38.05 ng/mL (ref 30.00–100.00)

## 2022-11-08 LAB — LIPID PANEL
Cholesterol: 129 mg/dL (ref 0–200)
HDL: 53.6 mg/dL (ref >39.00)
LDL Cholesterol: 65 mg/dL (ref 0–99)
NonHDL: 75.44
Total CHOL/HDL Ratio: 2
Triglycerides: 51 mg/dL (ref 0.0–149.0)
VLDL: 10.2 mg/dL (ref 0.0–40.0)

## 2022-11-08 LAB — TSH: TSH: 1.52 u[IU]/mL (ref 0.35–5.50)

## 2022-11-08 LAB — VITAMIN B12: Vitamin B-12: >1500 pg/mL — ABNORMAL HIGH (ref 211–911)

## 2022-11-15 ENCOUNTER — Ambulatory Visit: Payer: Medicare HMO | Admitting: Internal Medicine

## 2022-11-22 ENCOUNTER — Ambulatory Visit (INDEPENDENT_AMBULATORY_CARE_PROVIDER_SITE_OTHER): Payer: Medicare HMO | Admitting: Internal Medicine

## 2022-11-22 VITALS — BP 128/80 | HR 75 | Temp 98.1°F | Ht 63.0 in | Wt 174.0 lb

## 2022-11-22 DIAGNOSIS — Z0001 Encounter for general adult medical examination with abnormal findings: Secondary | ICD-10-CM

## 2022-11-22 DIAGNOSIS — E7800 Pure hypercholesterolemia, unspecified: Secondary | ICD-10-CM | POA: Diagnosis not present

## 2022-11-22 DIAGNOSIS — E1165 Type 2 diabetes mellitus with hyperglycemia: Secondary | ICD-10-CM

## 2022-11-22 DIAGNOSIS — E538 Deficiency of other specified B group vitamins: Secondary | ICD-10-CM | POA: Diagnosis not present

## 2022-11-22 DIAGNOSIS — N1831 Chronic kidney disease, stage 3a: Secondary | ICD-10-CM

## 2022-11-22 DIAGNOSIS — I10 Essential (primary) hypertension: Secondary | ICD-10-CM | POA: Diagnosis not present

## 2022-11-22 DIAGNOSIS — Z7984 Long term (current) use of oral hypoglycemic drugs: Secondary | ICD-10-CM | POA: Diagnosis not present

## 2022-11-22 DIAGNOSIS — E559 Vitamin D deficiency, unspecified: Secondary | ICD-10-CM | POA: Diagnosis not present

## 2022-11-22 DIAGNOSIS — Z0000 Encounter for general adult medical examination without abnormal findings: Secondary | ICD-10-CM | POA: Diagnosis not present

## 2022-11-22 NOTE — Patient Instructions (Addendum)
Please have your Shingrix (shingles) shots done at your local pharmacy.  Please continue all other medications as before, and refills have been done if requested.  Please have the pharmacy call with any other refills you may need.  Please continue your efforts at being more active, low cholesterol diet, and weight control.  You are otherwise up to date with prevention measures today.  Please keep your appointments with your specialists as you may have planned  Please make an Appointment to return in 6 months, or sooner if needed, also with Lab Appointment for testing done 3-5 days before at the [facility] (so this is for TWO appointments - please see the scheduling desk as you leave)  

## 2022-11-22 NOTE — Progress Notes (Unsigned)
Patient ID: Erica Mills, female   DOB: 04/14/1956, 67 y.o.   MRN: [id number] .        Chief Complaint:: wellness exam and hld, htn, dm, ckd3a, low vit d, low b12       HPI:  Erica Mills is a 67 y.o. female here for wellness exam; for shingrix at the pharmacy, declines covid booster, o/w up to date                        Also Pt denies chest pain, increased sob or doe, wheezing, orthopnea, PND, increased LE swelling, palpitations, dizziness or syncope.   Pt denies polydipsia, polyuria, or new focal neuro s/s.    Pt denies fever, wt loss, night sweats, loss of appetite, or other constitutional symptoms  Has mild intermittent RLE sciatic pain no change.  Pt continues to have recurring LBP without change in severity, bowel or bladder change, fever, wt loss,  worsening LE numbness/weakness, gait change or falls. Has some discomfort to distal left foot about the planar 2 d mtp area but non tender, no swelling, redness and walks ok.      Wt Readings from Last 3 Encounters:  11/22/22 174 lb (78.9 kg)  05/10/22 178 lb (80.7 kg)  03/22/22 176 lb (79.8 kg)   BP Readings from Last 3 Encounters:  11/22/22 128/80  05/10/22 120/66  11/09/21 122/88   Immunization History  Administered Date(s) Administered   Fluad Quad(high Dose 65+) 07/14/2020, 05/10/2022   Influenza Split 04/02/2013, 02/25/2019   Influenza Whole 04/13/2007   Influenza, Quadrivalent, Recombinant, Inj, Pf 02/22/2019   Influenza,inj,Quad PF,6+ Mos 03/05/2014, 03/24/2015, 05/17/2016, 03/08/2017   Influenza-Unspecified 03/05/2014, 03/24/2015, 05/17/2016, 02/25/2019   Moderna Sars-Covid-2 Vaccination 09/07/2019, 10/08/2019   PFIZER(Purple Top)SARS-COV-2 Vaccination 04/10/2020   PPD Test 03/04/2017   Pneumococcal Conjugate-13 05/31/2016   Pneumococcal Polysaccharide-23 09/09/2014, 10/20/2020   Td 02/14/2009   Td (Adult),5 Lf Tetanus Toxid, Preservative Free 02/14/2009   Tdap 06/29/2019   Health Maintenance Due  Topic Date Due    Zoster Vaccines- Shingrix (1 of 2) Never done   COVID-19 Vaccine (4 - 2023-24 season) 02/08/2022      Past Medical History:  Diagnosis Date   ACHILLES TENDINITIS 10/06/2009   ALLERGIC RHINITIS 05/19/2007   CARPAL TUNNEL SYNDROME, BILATERAL 05/19/2007   Cyst of breast, right, solitary 07/11/2012   Diabetes mellitus without complication (HCC)    on meds   ELBOW PAIN, RIGHT 04/21/2008   Fibroids    GERD (gastroesophageal reflux disease)    with certain foods/OTC meds   GLUCOSE INTOLERANCE 02/14/2009   H/O menorrhagia    HYPERLIPIDEMIA 05/19/2007   on meds   HYPERTENSION 05/19/2007   on meds   HYPERTHYROIDISM 03/10/2009   on meds   Lateral epicondylitis  of elbow 04/21/2008   Seasonal allergies    SINUSITIS- ACUTE-NOS 05/11/2010   SMOKER 05/19/2007   Past Surgical History:  Procedure Laterality Date   CESAREAN SECTION     COLONOSCOPY  2012   CG-F/V-movi (exc)-tics-10 yr recall   TONSILLECTOMY      reports that she quit smoking about 10 years ago. Her smoking use included cigarettes. She has never used smokeless tobacco. She reports that she does not drink alcohol and does not use drugs. family history includes Cancer in her paternal uncle; Diabetes in her father, mother, and sister; Hypertension in her father and mother. Allergies  Allergen Reactions   Crestor [Rosuvastatin] Other (See Comments)  myalgia   Sulfonamide Derivatives     REACTION: rash   Current Outpatient Medications on File Prior to Visit  Medication Sig Dispense Refill   amLODipine (NORVASC) 10 MG tablet TAKE 1 TABLET BY MOUTH EVERY DAY 90 tablet 3   amoxicillin-clavulanate (AUGMENTIN) 875-125 MG tablet Take 1 tablet by mouth 2 (two) times daily. Take with food 20 tablet 0   aspirin 81 MG tablet Take 81 mg by mouth daily.     Aspirin-Calcium Carbonate (BAYER WOMENS) 81-777 MG TABS Take 81 mg by mouth daily at 2 am.     atorvastatin (LIPITOR) 80 MG tablet TAKE 1 TABLET BY MOUTH EVERY DAY 90 tablet 3    BIOTIN PO Take 1 tablet by mouth daily at 6 (six) AM.     cetirizine (ZYRTEC) 10 MG tablet Take 1 tablet (10 mg total) by mouth daily. 30 tablet 11   Cholecalciferol (VITAMIN D3 PO) Take 1 tablet by mouth daily at 6 (six) AM.     ezetimibe (ZETIA) 10 MG tablet Take 1 tablet (10 mg total) by mouth daily. 90 tablet 3   fluticasone (FLONASE) 50 MCG/ACT nasal spray Place 2 sprays into both nostrils daily. 16 g 6   gabapentin (NEURONTIN) 100 MG capsule TAKE 1 CAPSULE BY MOUTH AT BEDTIME. 30 capsule 2   levothyroxine (SYNTHROID) 50 MCG tablet TAKE 1 TABLET BY MOUTH EVERY DAY 90 tablet 3   lidocaine (LIDODERM) 5 % Place 1 patch onto the skin daily. Remove & Discard patch within 12 hours or as directed by MD 40 patch 2   metFORMIN (GLUCOPHAGE-XR) 500 MG 24 hr tablet TAKE 1 TABLET BY MOUTH EVERY DAY WITH BREAKFAST 90 tablet 3   Multiple Vitamins-Minerals (ZINC PO) Take 1 tablet by mouth daily at 6 (six) AM.     neomycin-polymyxin-hydrocortisone (CORTISPORIN) OTIC solution Place 4 drops into both ears 4 (four) times daily. 10 mL 2   sertraline (ZOLOFT) 50 MG tablet TAKE 1 TABLET BY MOUTH EVERY DAY 90 tablet 2   diclofenac sodium (VOLTAREN) 1 % GEL Apply 2 g topically 4 (four) times daily as needed. (Patient not taking: Reported on 03/22/2022) 200 g 3   triamcinolone cream (KENALOG) 0.1 % Apply 1 application topically 4 (four) times daily. As needed for rash (Patient not taking: Reported on 03/22/2022) 30 g 0   No current facility-administered medications on file prior to visit.        ROS:  All others reviewed and negative.  Objective        PE:  BP 128/80 (BP Location: Left Arm, Patient Position: Sitting, Cuff Size: Normal)   Pulse 75   Temp 98.1 F (36.7 C) (Oral)   Ht 5\' 3"  (1.6 m)   Wt 174 lb (78.9 kg)   SpO2 98%   BMI 30.82 kg/m                 Constitutional: Pt appears in NAD               HENT: Head: NCAT.                Right Ear: External ear normal.                 Left Ear:  External ear normal.                Eyes: . Pupils are equal, round, and reactive to light. Conjunctivae and EOM are normal  Nose: without d/c or deformity               Neck: Neck supple. Gross normal ROM               Cardiovascular: Normal rate and regular rhythm.                 Pulmonary/Chest: Effort normal and breath sounds without rales or wheezing.                Abd:  Soft, NT, ND, + BS, no organomegaly               Neurological: Pt is alert. At baseline orientation, motor grossly intact               Skin: Skin is warm. No rashes, no other new lesions, LE edema - none               Psychiatric: Pt behavior is normal without agitation   Micro: none  Cardiac tracings I have personally interpreted today:  none  Pertinent Radiological findings (summarize): none   Lab Results  Component Value Date   WBC 7.1 11/08/2022   HGB 13.4 11/08/2022   HCT 41.4 11/08/2022   PLT 207.0 11/08/2022   GLUCOSE 98 11/08/2022   CHOL 129 11/08/2022   TRIG 51.0 11/08/2022   HDL 53.60 11/08/2022   LDLDIRECT 153.3 05/19/2007   LDLCALC 65 11/08/2022   ALT 24 11/08/2022   AST 22 11/08/2022   NA 140 11/08/2022   K 4.1 11/08/2022   CL 105 11/08/2022   CREATININE 0.88 11/08/2022   BUN 15 11/08/2022   CO2 30 11/08/2022   TSH 1.52 11/08/2022   HGBA1C 6.5 11/08/2022   MICROALBUR 0.7 11/08/2022   Assessment/Plan:  DELONA HANEY is a 67 y.o. Black or African American [2] female with  has a past medical history of ACHILLES TENDINITIS (10/06/2009), ALLERGIC RHINITIS (05/19/2007), CARPAL TUNNEL SYNDROME, BILATERAL (05/19/2007), Cyst of breast, right, solitary (07/11/2012), Diabetes mellitus without complication (HCC), ELBOW PAIN, RIGHT (04/21/2008), Fibroids, GERD (gastroesophageal reflux disease), GLUCOSE INTOLERANCE (02/14/2009), H/O menorrhagia, HYPERLIPIDEMIA (05/19/2007), HYPERTENSION (05/19/2007), HYPERTHYROIDISM (03/10/2009), Lateral epicondylitis  of elbow (04/21/2008), Seasonal  allergies, SINUSITIS- ACUTE-NOS (05/11/2010), and SMOKER (05/19/2007).  Encounter for well adult exam with abnormal findings Age and sex appropriate education and counseling updated with regular exercise and diet Referrals for preventative services - none needed Immunizations addressed - for shingrix at pharmacy, declines covid booster Smoking counseling  - none needed Evidence for depression or other mood disorder - none significant Most recent labs reviewed. I have personally reviewed and have noted: 1) the patient's medical and social history 2) The patient's current medications and supplements 3) The patient's height, weight, and BMI have been recorded in the chart   Hyperlipidemia Lab Results  Component Value Date   LDLCALC 65 11/08/2022   Stable, pt to continue current statin lipitor 80 every day, zetia 10 qd   Essential hypertension BP Readings from Last 3 Encounters:  11/22/22 128/80  05/10/22 120/66  11/09/21 122/88   Stable, pt to continue medical treatment norvasc 10 qd   Diabetes Lab Results  Component Value Date   HGBA1C 6.5 11/08/2022   Stable, pt to continue current medical treatment metformin ER 500 qd   CKD (chronic kidney disease) stage 3, GFR 30-59 ml/min (HCC) Lab Results  Component Value Date   CREATININE 0.88 11/08/2022   Stable overall, cont to avoid nephrotoxins   Vitamin D deficiency Last vitamin  D Lab Results  Component Value Date   VD25OH 38.05 11/08/2022   Low, to start oral replacement   B12 deficiency Lab Results  Component Value Date   VITAMINB12 >1500 (H) 11/08/2022   Stable, cont oral replacement - b12 1000 mcg qd  Followup: Return in about 6 months (around 05/24/2023).  Oliver Barre, MD 11/23/2022 8:13 PM [facility]

## 2022-11-23 ENCOUNTER — Encounter: Payer: Self-pay | Admitting: Internal Medicine

## 2022-11-23 NOTE — Assessment & Plan Note (Signed)
Lab Results  Component Value Date   LDLCALC 65 11/08/2022   Stable, pt to continue current statin lipitor 80 every day, zetia 10 qd

## 2022-11-23 NOTE — Assessment & Plan Note (Signed)
Lab Results  Component Value Date   CREATININE 0.88 11/08/2022   Stable overall, cont to avoid nephrotoxins

## 2022-11-23 NOTE — Assessment & Plan Note (Signed)
Lab Results  Component Value Date   HGBA1C 6.5 11/08/2022   Stable, pt to continue current medical treatment metformin ER 500 qd

## 2022-11-23 NOTE — Assessment & Plan Note (Signed)
BP Readings from Last 3 Encounters:  11/22/22 128/80  05/10/22 120/66  11/09/21 122/88   Stable, pt to continue medical treatment norvasc 10 qd

## 2022-11-23 NOTE — Assessment & Plan Note (Signed)
Last vitamin D Lab Results  Component Value Date   VD25OH 38.05 11/08/2022   Low, to start oral replacement

## 2022-11-23 NOTE — Assessment & Plan Note (Signed)
Lab Results  Component Value Date   VITAMINB12 >1500 (H) 11/08/2022   Stable, cont oral replacement - b12 1000 mcg qd

## 2022-11-23 NOTE — Assessment & Plan Note (Signed)
Age and sex appropriate education and counseling updated with regular exercise and diet Referrals for preventative services - none needed Immunizations addressed - for shingrix at pharmacy, declines covid booster Smoking counseling  - none needed Evidence for depression or other mood disorder - none significant Most recent labs reviewed. I have personally reviewed and have noted: 1) the patient's medical and social history 2) The patient's current medications and supplements 3) The patient's height, weight, and BMI have been recorded in the chart  

## 2022-12-20 ENCOUNTER — Other Ambulatory Visit: Payer: Self-pay

## 2022-12-20 ENCOUNTER — Other Ambulatory Visit: Payer: Self-pay | Admitting: Internal Medicine

## 2022-12-29 ENCOUNTER — Other Ambulatory Visit: Payer: Self-pay | Admitting: Internal Medicine

## 2022-12-30 ENCOUNTER — Other Ambulatory Visit: Payer: Self-pay

## 2023-01-17 ENCOUNTER — Ambulatory Visit
Admission: RE | Admit: 2023-01-17 | Discharge: 2023-01-17 | Disposition: A | Discharge status: Home / Self Care | Payer: Medicare HMO | Source: Ambulatory Visit | Attending: Obstetrics and Gynecology | Admitting: Obstetrics and Gynecology

## 2023-01-17 ENCOUNTER — Ambulatory Visit: Payer: Medicare HMO

## 2023-01-17 DIAGNOSIS — Z1231 Encounter for screening mammogram for malignant neoplasm of breast: Secondary | ICD-10-CM

## 2023-01-20 ENCOUNTER — Other Ambulatory Visit: Payer: Self-pay | Admitting: Obstetrics and Gynecology

## 2023-01-20 DIAGNOSIS — R928 Other abnormal and inconclusive findings on diagnostic imaging of breast: Secondary | ICD-10-CM

## 2023-02-21 ENCOUNTER — Other Ambulatory Visit: Payer: Self-pay | Admitting: Obstetrics and Gynecology

## 2023-02-21 ENCOUNTER — Ambulatory Visit
Admission: RE | Admit: 2023-02-21 | Discharge: 2023-02-21 | Disposition: A | Discharge status: Home / Self Care | Payer: Medicare HMO | Source: Ambulatory Visit | Attending: Obstetrics and Gynecology | Admitting: Obstetrics and Gynecology

## 2023-02-21 DIAGNOSIS — Z9889 Other specified postprocedural states: Secondary | ICD-10-CM | POA: Diagnosis not present

## 2023-02-21 DIAGNOSIS — R928 Other abnormal and inconclusive findings on diagnostic imaging of breast: Secondary | ICD-10-CM | POA: Diagnosis not present

## 2023-02-21 DIAGNOSIS — R921 Mammographic calcification found on diagnostic imaging of breast: Secondary | ICD-10-CM

## 2023-02-26 ENCOUNTER — Other Ambulatory Visit: Payer: Self-pay | Admitting: Internal Medicine

## 2023-03-14 ENCOUNTER — Other Ambulatory Visit: Payer: Self-pay | Admitting: Internal Medicine

## 2023-03-28 ENCOUNTER — Ambulatory Visit (INDEPENDENT_AMBULATORY_CARE_PROVIDER_SITE_OTHER): Payer: Medicare HMO

## 2023-03-28 VITALS — Ht 63.0 in | Wt 174.0 lb

## 2023-03-28 DIAGNOSIS — Z0000 Encounter for general adult medical examination without abnormal findings: Secondary | ICD-10-CM | POA: Diagnosis not present

## 2023-03-28 NOTE — Patient Instructions (Addendum)
[name] , Thank you for taking time to come for your Medicare Wellness Visit. I appreciate your ongoing commitment to your health goals. Please review the following plan we discussed and let me know if I can assist you in the future.   Referrals/Orders/Follow-Ups/Clinician Recommendations: You are due for a Shingles vaccine and a Flu vaccine.  It was nice speaking to you today.  Each day, aim for 6 glasses of water, plenty of protein in your diet and try to get up and walk/ stretch every hour for 5-10 minutes at a time.    This is a list of the screening recommended for you and due dates:  Health Maintenance  Topic Date Due   Zoster (Shingles) Vaccine (1 of 2) Never done   Flu Shot  01/09/2023   COVID-19 Vaccine (4 - 2023-24 season) 02/09/2023   Screening for Lung Cancer  05/11/2023*   Eye exam for diabetics  04/24/2023   Hemoglobin A1C  05/10/2023   Yearly kidney function blood test for diabetes  11/08/2023   Yearly kidney health urinalysis for diabetes  11/08/2023   Complete foot exam   11/22/2023   Mammogram  02/21/2024   Medicare Annual Wellness Visit  03/27/2024   DTaP/Tdap/Td vaccine (4 - Td or Tdap) 06/28/2029   Colon Cancer Screening  01/30/2031   Pneumonia Vaccine  Completed   DEXA scan (bone density measurement)  Completed   Hepatitis C Screening  Completed   HPV Vaccine  Aged Out  *Topic was postponed. The date shown is not the original due date.    Advanced directives: (Copy Requested) Please bring a copy of your health care power of attorney and living will to the office to be added to your chart at your convenience.  Next Medicare Annual Wellness Visit scheduled for next year: Yes

## 2023-03-28 NOTE — Progress Notes (Signed)
Subjective:   Erica Mills is a 67 y.o. female who presents for Medicare Annual (Subsequent) preventive examination.  Visit Complete: Virtual I [name] on 03/28/23 by a audio enabled telemedicine application and verified that I am speaking with the correct person using two identifiers.  Patient Location: Home  Provider Location: Home Office  I discussed the limitations of evaluation and management by telemedicine. The patient expressed understanding and agreed to proceed.  Vital Signs: Because this visit was a virtual/telehealth visit, some criteria may be missing or patient reported. Any vitals not documented were not able to be obtained and vitals that have been documented are patient reported.   Cardiac Risk Factors include: advanced age (>19men, >39 women);hypertension;diabetes mellitus;Other (see comment);dyslipidemia, Risk factor comments: CKD     Objective:    Today's Vitals   03/28/23 0855  Weight: 174 lb (78.9 kg)  Height: 5\' 3"  (1.6 m)   Body mass index is 30.82 kg/m.     03/28/2023    9:06 AM 03/22/2022   10:04 AM  Advanced Directives  Does Patient Have a Medical Advance Directive? No No  Would patient like information on creating a medical advance directive?  No - Patient declined    Current Medications (verified) Outpatient Encounter Medications as of 03/28/2023  Medication Sig   amLODipine (NORVASC) 10 MG tablet TAKE 1 TABLET BY MOUTH EVERY DAY   aspirin 81 MG tablet Take 81 mg by mouth daily.   Aspirin-Calcium Carbonate (BAYER WOMENS) 81-777 MG TABS Take 81 mg by mouth daily at 2 am.   atorvastatin (LIPITOR) 80 MG tablet TAKE 1 TABLET BY MOUTH EVERY DAY   BIOTIN PO Take 1 tablet by mouth daily at 6 (six) AM.   cetirizine (ZYRTEC) 10 MG tablet Take 1 tablet (10 mg total) by mouth daily.   Cholecalciferol (VITAMIN D3 PO) Take 1 tablet by mouth daily at 6 (six) AM.   diclofenac sodium (VOLTAREN) 1 % GEL Apply 2 g topically 4 (four)  times daily as needed.   ezetimibe (ZETIA) 10 MG tablet Take 1 tablet (10 mg total) by mouth daily.   fluticasone (FLONASE) 50 MCG/ACT nasal spray Place 2 sprays into both nostrils daily.   gabapentin (NEURONTIN) 100 MG capsule TAKE 1 CAPSULE BY MOUTH AT BEDTIME.   levothyroxine (SYNTHROID) 50 MCG tablet TAKE 1 TABLET BY MOUTH EVERY DAY   lidocaine (LIDODERM) 5 % Place 1 patch onto the skin daily. Remove & Discard patch within 12 hours or as directed by MD   metFORMIN (GLUCOPHAGE-XR) 500 MG 24 hr tablet TAKE 1 TABLET BY MOUTH EVERY DAY WITH BREAKFAST   Multiple Vitamins-Minerals (ZINC PO) Take 1 tablet by mouth daily at 6 (six) AM.   neomycin-polymyxin-hydrocortisone (CORTISPORIN) OTIC solution Place 4 drops into both ears 4 (four) times daily.   sertraline (ZOLOFT) 50 MG tablet TAKE 1 TABLET BY MOUTH EVERY DAY   amoxicillin-clavulanate (AUGMENTIN) 875-125 MG tablet Take 1 tablet by mouth 2 (two) times daily. Take with food (Patient not taking: Reported on 03/28/2023)   triamcinolone cream (KENALOG) 0.1 % Apply 1 application topically 4 (four) times daily. As needed for rash (Patient not taking: Reported on 03/22/2022)   No facility-administered encounter medications on file as of 03/28/2023.    Allergies (verified) Crestor [rosuvastatin] and Sulfonamide derivatives   History: Past Medical History:  Diagnosis Date   ACHILLES TENDINITIS 10/06/2009   ALLERGIC RHINITIS 05/19/2007   CARPAL TUNNEL SYNDROME, BILATERAL 05/19/2007   Cyst of  breast, right, solitary 07/11/2012   Diabetes mellitus without complication (HCC)    on meds   ELBOW PAIN, RIGHT 04/21/2008   Fibroids    GERD (gastroesophageal reflux disease)    with certain foods/OTC meds   GLUCOSE INTOLERANCE 02/14/2009   H/O menorrhagia    HYPERLIPIDEMIA 05/19/2007   on meds   HYPERTENSION 05/19/2007   on meds   HYPERTHYROIDISM 03/10/2009   on meds   Lateral epicondylitis  of elbow 04/21/2008   Seasonal allergies     SINUSITIS- ACUTE-NOS 05/11/2010   SMOKER 05/19/2007   Past Surgical History:  Procedure Laterality Date   CESAREAN SECTION     COLONOSCOPY  2012   CG-F/V-movi (exc)-tics-10 yr recall   TONSILLECTOMY     Family History  Problem Relation Age of Onset   Hypertension Mother    Diabetes Mother    Diabetes Father    Hypertension Father    Diabetes Sister    Cancer Paternal Uncle        colon cancer   Colon cancer Neg Hx    Colon polyps Neg Hx    Esophageal cancer Neg Hx    Rectal cancer Neg Hx    Stomach cancer Neg Hx    Social History   Socioeconomic History   Marital status: Widowed    Spouse name: Not on file   Number of children: 3   Years of education: Not on file   Highest education level: Not on file  Occupational History   Occupation: retired  Tobacco Use   Smoking status: Former    Current packs/day: 0.00    Types: Cigarettes    Quit date: 06/10/2012    Years since quitting: 10.8   Smokeless tobacco: Never   Tobacco comments:    QUIT SMOKING X 4 WEEKS  Vaping Use   Vaping status: Never Used  Substance and Sexual Activity   Alcohol use: No   Drug use: No   Sexual activity: Yes    Birth control/protection: Surgical, Post-menopausal    Comment: BTL  Other Topics Concern   Not on file  Social History Narrative   Grandson lives with her.   Social Determinants of Health   Financial Resource Strain: Low Risk  (03/28/2023)   Overall Financial Resource Strain (CARDIA)    Difficulty of Paying Living Expenses: Not very hard  Food Insecurity: No Food Insecurity (03/28/2023)   Hunger Vital Sign    Worried About Running Out of Food in the Last Year: Never true    Ran Out of Food in the Last Year: Never true  Transportation Needs: No Transportation Needs (03/28/2023)   PRAPARE - Administrator, Civil Service (Medical): No    Lack of Transportation (Non-Medical): No  Physical Activity: Inactive (03/28/2023)   Exercise Vital Sign    Days of Exercise  per Week: 0 days    Minutes of Exercise per Session: 0 min  Stress: Stress Concern Present (03/28/2023)   [employer] of Occupational Health - Occupational Stress Questionnaire    Feeling of Stress : To some extent  Social Connections: Moderately Integrated (03/28/2023)   Social Connection and Isolation Panel [NHANES]    Frequency of Communication with Friends and Family: More than three times a week    Frequency of Social Gatherings with Friends and Family: Twice a week    Attends Religious Services: More than 4 times per year    Active Member of [employer] or Organizations: Yes    Attends [employer]  or Organization Meetings: Never    Marital Status: Widowed    Tobacco Counseling Counseling given: Not Answered Tobacco comments: QUIT SMOKING X 4 WEEKS   Clinical Intake:  Pre-visit preparation completed: Yes  Pain : No/denies pain     BMI - recorded: 30.82 Nutritional Status: BMI > 30  Obese Nutritional Risks: None Diabetes: Yes CBG done?: No Did pt. bring in CBG monitor from home?: No  How often do you need to have someone help you when you read instructions, pamphlets, or other written materials from your doctor or pharmacy?: 1 - Never  Interpreter Needed?: No  Information entered by :: Callum Wolf, RMA   Activities of Daily Living    03/28/2023    9:03 AM  In your present state of health, do you have any difficulty performing the following activities:  Hearing? 0  Vision? 0  Difficulty concentrating or making decisions? 0  Walking or climbing stairs? 0  Dressing or bathing? 0  Doing errands, shopping? 0  Preparing Food and eating ? N  Using the Toilet? N  In the past six months, have you accidently leaked urine? N  Do you have problems with loss of bowel control? N  Managing your Medications? N  Managing your Finances? N  Housekeeping or managing your Housekeeping? N    Patient Care Team: Corwin Levins, MD as PCP - General  Indicate any recent Medical  Services you may have received from other than [facility] providers in the past year (date may be approximate).     Assessment:   This is a routine wellness examination for Erica Mills.  Hearing/Vision screen Hearing Screening - Comments:: Denies hearing difficulties   Vision Screening - Comments:: Denied vision issues.   Goals Addressed             This Visit's Progress    DIET - INCREASE WATER INTAKE   On track     Depression Screen    03/28/2023    9:11 AM 11/22/2022    9:00 AM 05/10/2022    9:54 AM 11/09/2021    9:41 AM 11/09/2021    9:05 AM 11/07/2021    1:49 PM 05/11/2021    9:49 AM  PHQ 2/9 Scores  PHQ - 2 Score 1 0 0 0 0 0 0  PHQ- 9 Score 1  0  0 4     Fall Risk    03/28/2023    9:06 AM 11/22/2022    9:00 AM 05/10/2022    9:54 AM 03/22/2022    9:59 AM 11/09/2021    9:41 AM  Fall Risk   Falls in the past year? 0 0 0 0 1  Number falls in past yr: 0 0  0 0  Injury with Fall? 0 0 0 0 0  Risk for fall due to : No Fall Risks No Fall Risks No Fall Risks No Fall Risks   Follow up Falls prevention discussed;Falls evaluation completed Falls evaluation completed Falls evaluation completed Falls prevention discussed     MEDICARE RISK AT HOME: Medicare Risk at Home Any stairs in or around the home?: Yes If so, are there any without handrails?: Yes Home free of loose throw rugs in walkways, pet beds, electrical cords, etc?: Yes Adequate lighting in your home to reduce risk of falls?: Yes Life alert?: No Use of a cane, walker or w/c?: No Grab bars in the bathroom?: No Shower chair or bench in shower?: No Elevated toilet seat or a handicapped toilet?: No  TIMED UP AND GO:  Was the test performed?  No    Cognitive Function:        03/28/2023    9:08 AM 03/22/2022   10:04 AM  6CIT Screen  What Year? 0 points 0 points  What month? 0 points 0 points  What time? 0 points 0 points  Count back from 20 0 points 0 points  Months in reverse 0 points 0 points  Repeat phrase 2  points 0 points  Total Score 2 points 0 points    Immunizations Immunization History  Administered Date(s) Administered   Fluad Quad(high Dose 65+) 07/14/2020, 05/10/2022   Influenza Split 04/02/2013, 02/25/2019   Influenza Whole 04/13/2007   Influenza, Quadrivalent, Recombinant, Inj, Pf 02/22/2019   Influenza,inj,Quad PF,6+ Mos 03/05/2014, 03/24/2015, 05/17/2016, 03/08/2017   Influenza-Unspecified 03/05/2014, 03/24/2015, 05/17/2016, 02/25/2019   Moderna Sars-Covid-2 Vaccination 09/07/2019, 10/08/2019   PFIZER(Purple Top)SARS-COV-2 Vaccination 04/10/2020   PPD Test 03/04/2017   Pneumococcal Conjugate-13 05/31/2016   Pneumococcal Polysaccharide-23 09/09/2014, 10/20/2020   Td 02/14/2009   Td (Adult),5 Lf Tetanus Toxid, Preservative Free 02/14/2009   Tdap 06/29/2019    TDAP status: Up to date  Flu Vaccine status: Due, Education has been provided regarding the importance of this vaccine. Advised may receive this vaccine at local pharmacy or Health Dept. Aware to provide a copy of the vaccination record if obtained from local pharmacy or Health Dept. Verbalized acceptance and understanding.  Pneumococcal vaccine status: Up to date  Covid-19 vaccine status: Completed vaccines  Qualifies for Shingles Vaccine? Yes   Zostavax completed No   Shingrix Completed?: No.    Education has been provided regarding the importance of this vaccine. Patient has been advised to call insurance company to determine out of pocket expense if they have not yet received this vaccine. Advised may also receive vaccine at local pharmacy or Health Dept. Verbalized acceptance and understanding.  Screening Tests Health Maintenance  Topic Date Due   Zoster Vaccines- Shingrix (1 of 2) Never done   INFLUENZA VACCINE  01/09/2023   COVID-19 Vaccine (4 - 2023-24 season) 02/09/2023   Lung Cancer Screening  05/11/2023 (Originally 07/08/2005)   OPHTHALMOLOGY EXAM  04/24/2023   HEMOGLOBIN A1C  05/10/2023   Diabetic  kidney evaluation - eGFR measurement  11/08/2023   Diabetic kidney evaluation - Urine ACR  11/08/2023   FOOT EXAM  11/22/2023   MAMMOGRAM  02/21/2024   Medicare Annual Wellness (AWV)  03/27/2024   DTaP/Tdap/Td (4 - Td or Tdap) 06/28/2029   Colonoscopy  01/30/2031   Pneumonia Vaccine 50+ Years old  Completed   DEXA SCAN  Completed   Hepatitis C Screening  Completed   HPV VACCINES  Aged Out    Health Maintenance  Health Maintenance Due  Topic Date Due   Zoster Vaccines- Shingrix (1 of 2) Never done   INFLUENZA VACCINE  01/09/2023   COVID-19 Vaccine (4 - 2023-24 season) 02/09/2023    Colorectal cancer screening: Type of screening: Colonoscopy. Completed 01/29/2021. Repeat every 10 years  Mammogram status: Completed 02/21/2023. Repeat every year  Bone Density status: Completed 11/16/2021. Results reflect: Bone density results: NORMAL. Repeat every 2 years.  Lung Cancer Screening: (Low Dose CT Chest recommended if Age 65-80 years, 20 pack-year currently smoking OR have quit w/in 15years.) does not qualify.   Lung Cancer Screening Referral: N/A  Additional Screening:  Hepatitis C Screening: does qualify; Completed 11/03/2015  Vision Screening: Recommended annual ophthalmology exams for early detection of glaucoma and other disorders of the  eye. Is the patient up to date with their annual eye exam?  No  Who is the provider or what is the name of the office in which the patient attends annual eye exams? Dr. Dione Booze If pt is not established with a provider, would they like to be referred to a provider to establish care? No .   Dental Screening: Recommended annual dental exams for proper oral hygiene  Diabetic Foot Exam: Diabetic Foot Exam: Completed 11/22/2022  Community Resource Referral / Chronic Care Management: CRR required this visit?  No   CCM required this visit?  No     Plan:     I have personally reviewed and noted the following in the patient's chart:   Medical  and social history Use of alcohol, tobacco or illicit drugs  Current medications and supplements including opioid prescriptions. Patient is not currently taking opioid prescriptions. Functional ability and status Nutritional status Physical activity Advanced directives List of other physicians Hospitalizations, surgeries, and ER visits in previous 12 months Vitals Screenings to include cognitive, depression, and falls Referrals and appointments  In addition, I have reviewed and discussed with patient certain preventive protocols, quality metrics, and best practice recommendations. A written personalized care plan for preventive services as well as general preventive health recommendations were provided to patient.     Desarie Feild L Katlen Seyer, CMA   03/28/2023   After Visit Summary: (MyChart) Due to this being a telephonic visit, the after visit summary with patients personalized plan was offered to patient via MyChart   Nurse Notes: Patient is due for a Shingles vaccine and a Flu vaccine.  Patient stated that she would have them done soon but will get the Flu vaccine during her next office visit.  She is up to date on all other health maintenance.  Patient had no concerns to address today.

## 2023-04-26 ENCOUNTER — Other Ambulatory Visit: Payer: Self-pay | Admitting: Internal Medicine

## 2023-04-28 ENCOUNTER — Other Ambulatory Visit: Payer: Self-pay

## 2023-05-23 ENCOUNTER — Encounter: Payer: Self-pay | Admitting: Internal Medicine

## 2023-05-23 ENCOUNTER — Ambulatory Visit (INDEPENDENT_AMBULATORY_CARE_PROVIDER_SITE_OTHER): Payer: Medicare HMO | Admitting: Internal Medicine

## 2023-05-23 VITALS — BP 110/64 | HR 75 | Temp 98.5°F | Ht 63.0 in | Wt 183.0 lb

## 2023-05-23 DIAGNOSIS — E1165 Type 2 diabetes mellitus with hyperglycemia: Secondary | ICD-10-CM | POA: Diagnosis not present

## 2023-05-23 DIAGNOSIS — Z7984 Long term (current) use of oral hypoglycemic drugs: Secondary | ICD-10-CM | POA: Diagnosis not present

## 2023-05-23 DIAGNOSIS — E890 Postprocedural hypothyroidism: Secondary | ICD-10-CM | POA: Diagnosis not present

## 2023-05-23 DIAGNOSIS — I10 Essential (primary) hypertension: Secondary | ICD-10-CM

## 2023-05-23 DIAGNOSIS — E538 Deficiency of other specified B group vitamins: Secondary | ICD-10-CM | POA: Diagnosis not present

## 2023-05-23 DIAGNOSIS — E7800 Pure hypercholesterolemia, unspecified: Secondary | ICD-10-CM | POA: Diagnosis not present

## 2023-05-23 DIAGNOSIS — J309 Allergic rhinitis, unspecified: Secondary | ICD-10-CM

## 2023-05-23 DIAGNOSIS — E559 Vitamin D deficiency, unspecified: Secondary | ICD-10-CM

## 2023-05-23 DIAGNOSIS — Z23 Encounter for immunization: Secondary | ICD-10-CM | POA: Diagnosis not present

## 2023-05-23 LAB — BASIC METABOLIC PANEL
BUN: 12 mg/dL (ref 6–23)
CO2: 28 meq/L (ref 19–32)
Calcium: 9.2 mg/dL (ref 8.4–10.5)
Chloride: 106 meq/L (ref 96–112)
Creatinine, Ser: 0.81 mg/dL (ref 0.40–1.20)
GFR: 74.88 mL/min (ref >60.00)
Glucose, Bld: 105 mg/dL — ABNORMAL HIGH (ref 70–99)
Potassium: 3.8 meq/L (ref 3.5–5.1)
Sodium: 142 meq/L (ref 135–145)

## 2023-05-23 LAB — LIPID PANEL
Cholesterol: 157 mg/dL (ref 0–200)
HDL: 62.6 mg/dL (ref >39.00)
LDL Cholesterol: 82 mg/dL (ref 0–99)
NonHDL: 94.79
Total CHOL/HDL Ratio: 3
Triglycerides: 62 mg/dL (ref 0.0–149.0)
VLDL: 12.4 mg/dL (ref 0.0–40.0)

## 2023-05-23 LAB — HEPATIC FUNCTION PANEL
ALT: 21 U/L (ref 0–35)
AST: 21 U/L (ref 0–37)
Albumin: 4.3 g/dL (ref 3.5–5.2)
Alkaline Phosphatase: 76 U/L (ref 39–117)
Bilirubin, Direct: 0.1 mg/dL (ref 0.0–0.3)
Total Bilirubin: 0.4 mg/dL (ref 0.2–1.2)
Total Protein: 7.3 g/dL (ref 6.0–8.3)

## 2023-05-23 LAB — HEMOGLOBIN A1C: Hgb A1c MFr Bld: 6.8 % — ABNORMAL HIGH (ref 4.6–6.5)

## 2023-05-23 LAB — VITAMIN D 25 HYDROXY (VIT D DEFICIENCY, FRACTURES): VITD: 38.38 ng/mL (ref 30.00–100.00)

## 2023-05-23 LAB — TSH: TSH: 2.36 u[IU]/mL (ref 0.35–5.50)

## 2023-05-23 NOTE — Progress Notes (Signed)
The test results show that your current treatment is OK, as the tests are stable.  Please continue the same plan.  There is no other need for change of treatment or further evaluation based on these results, at this time.  thanks 

## 2023-05-23 NOTE — Progress Notes (Unsigned)
Patient ID: Erica Mills, female   DOB: [birth year], 67 y.o.   MRN: [id number]        Chief Complaint: follow up HTN, HLD and hyperglycemia , low vit d, low thyroid, low b12       HPI:  Erica Mills is a 67 y.o. female here overall doing ok,  Pt denies chest pain, increased sob or doe, wheezing, orthopnea, PND, increased LE swelling, palpitations, dizziness or syncope.   Pt denies polydipsia, polyuria, or new focal neuro s/s.    Pt denies fever, wt loss, night sweats, loss of appetite, or other constitutional symptoms   Gained wt with less activity and a few extra desserts.  Does have several wks ongoing nasal allergy symptoms with clearish congestion, itch and sneezing, without fever, pain, ST, cough, swelling or wheezing. Wt Readings from Last 3 Encounters:  05/23/23 183 lb (83 kg)  03/28/23 174 lb (78.9 kg)  11/22/22 174 lb (78.9 kg)   BP Readings from Last 3 Encounters:  05/23/23 110/64  11/22/22 128/80  05/10/22 120/66         Past Medical History:  Diagnosis Date   ACHILLES TENDINITIS 10/06/2009   ALLERGIC RHINITIS 05/19/2007   CARPAL TUNNEL SYNDROME, BILATERAL 05/19/2007   Cyst of breast, right, solitary 07/11/2012   Diabetes mellitus without complication (HCC)    on meds   ELBOW PAIN, RIGHT 04/21/2008   Fibroids    GERD (gastroesophageal reflux disease)    with certain foods/OTC meds   GLUCOSE INTOLERANCE 02/14/2009   H/O menorrhagia    HYPERLIPIDEMIA 05/19/2007   on meds   HYPERTENSION 05/19/2007   on meds   HYPERTHYROIDISM 03/10/2009   on meds   Lateral epicondylitis  of elbow 04/21/2008   Seasonal allergies    SINUSITIS- ACUTE-NOS 05/11/2010   SMOKER 05/19/2007   Past Surgical History:  Procedure Laterality Date   CESAREAN SECTION     COLONOSCOPY  2012   CG-F/V-movi (exc)-tics-10 yr recall   TONSILLECTOMY      reports that she quit smoking about 10 years ago. Her smoking use included cigarettes. She has never used smokeless tobacco. She reports that she  does not drink alcohol and does not use drugs. family history includes Cancer in her paternal uncle; Diabetes in her father, mother, and sister; Hypertension in her father and mother. Allergies  Allergen Reactions   Crestor [Rosuvastatin] Other (See Comments)    myalgia   Sulfonamide Derivatives     REACTION: rash   Current Outpatient Medications on File Prior to Visit  Medication Sig Dispense Refill   amLODipine (NORVASC) 10 MG tablet TAKE 1 TABLET BY MOUTH EVERY DAY 90 tablet 3   aspirin 81 MG tablet Take 81 mg by mouth daily.     Aspirin-Calcium Carbonate (BAYER WOMENS) 81-777 MG TABS Take 81 mg by mouth daily at 2 am.     atorvastatin (LIPITOR) 80 MG tablet TAKE 1 TABLET BY MOUTH EVERY DAY 90 tablet 3   BIOTIN PO Take 1 tablet by mouth daily at 6 (six) AM.     cetirizine (ZYRTEC) 10 MG tablet Take 1 tablet (10 mg total) by mouth daily. 30 tablet 11   Cholecalciferol (VITAMIN D3 PO) Take 1 tablet by mouth daily at 6 (six) AM.     diclofenac sodium (VOLTAREN) 1 % GEL Apply 2 g topically 4 (four) times daily as needed. 200 g 3   ezetimibe (ZETIA) 10 MG tablet TAKE 1 TABLET BY MOUTH EVERY DAY 90 tablet 3  fluticasone (FLONASE) 50 MCG/ACT nasal spray Place 2 sprays into both nostrils daily. 16 g 6   gabapentin (NEURONTIN) 100 MG capsule TAKE 1 CAPSULE BY MOUTH AT BEDTIME. 30 capsule 2   levothyroxine (SYNTHROID) 50 MCG tablet TAKE 1 TABLET BY MOUTH EVERY DAY 90 tablet 3   lidocaine (LIDODERM) 5 % Place 1 patch onto the skin daily. Remove & Discard patch within 12 hours or as directed by MD 40 patch 2   metFORMIN (GLUCOPHAGE-XR) 500 MG 24 hr tablet TAKE 1 TABLET BY MOUTH EVERY DAY WITH BREAKFAST 90 tablet 3   Multiple Vitamins-Minerals (ZINC PO) Take 1 tablet by mouth daily at 6 (six) AM.     neomycin-polymyxin-hydrocortisone (CORTISPORIN) OTIC solution Place 4 drops into both ears 4 (four) times daily. 10 mL 2   sertraline (ZOLOFT) 50 MG tablet TAKE 1 TABLET BY MOUTH EVERY DAY 90 tablet 2    triamcinolone cream (KENALOG) 0.1 % Apply 1 application topically 4 (four) times daily. As needed for rash 30 g 0   amoxicillin-clavulanate (AUGMENTIN) 875-125 MG tablet Take 1 tablet by mouth 2 (two) times daily. Take with food (Patient not taking: Reported on 05/23/2023) 20 tablet 0   No current facility-administered medications on file prior to visit.        ROS:  All others reviewed and negative.  Objective        PE:  BP 110/64 (BP Location: Right Arm, Patient Position: Sitting, Cuff Size: Normal)   Pulse 75   Temp 98.5 F (36.9 C) (Oral)   Ht 5\' 3"  (1.6 m)   Wt 183 lb (83 kg)   SpO2 99%   BMI 32.42 kg/m                 Constitutional: Pt appears in NAD               HENT: Head: NCAT.                Right Ear: External ear normal.                 Left Ear: External ear normal.                Eyes: . Pupils are equal, round, and reactive to light. Conjunctivae and EOM are normal               Nose: without d/c or deformity               Neck: Neck supple. Gross normal ROM               Cardiovascular: Normal rate and regular rhythm.                 Pulmonary/Chest: Effort normal and breath sounds without rales or wheezing.                Abd:  Soft, NT, ND, + BS, no organomegaly               Neurological: Pt is alert. At baseline orientation, motor grossly intact               Skin: Skin is warm. No rashes, no other new lesions, LE edema - none               Psychiatric: Pt behavior is normal without agitation   Micro: none  Cardiac tracings I have personally interpreted today:  none  Pertinent Radiological findings (summarize): none  Lab Results  Component Value Date   WBC 7.1 11/08/2022   HGB 13.4 11/08/2022   HCT 41.4 11/08/2022   PLT 207.0 11/08/2022   GLUCOSE 105 (H) 05/23/2023   CHOL 157 05/23/2023   TRIG 62.0 05/23/2023   HDL 62.60 05/23/2023   LDLDIRECT 153.3 05/19/2007   LDLCALC 82 05/23/2023   ALT 21 05/23/2023   AST 21 05/23/2023   NA 142  05/23/2023   K 3.8 05/23/2023   CL 106 05/23/2023   CREATININE 0.81 05/23/2023   BUN 12 05/23/2023   CO2 28 05/23/2023   TSH 2.36 05/23/2023   HGBA1C 6.8 (H) 05/23/2023   MICROALBUR 0.7 11/08/2022   Assessment/Plan:  Erica Mills is a 67 y.o. Black or African American [2] female with  has a past medical history of ACHILLES TENDINITIS (10/06/2009), ALLERGIC RHINITIS (05/19/2007), CARPAL TUNNEL SYNDROME, BILATERAL (05/19/2007), Cyst of breast, right, solitary (07/11/2012), Diabetes mellitus without complication (HCC), ELBOW PAIN, RIGHT (04/21/2008), Fibroids, GERD (gastroesophageal reflux disease), GLUCOSE INTOLERANCE (02/14/2009), H/O menorrhagia, HYPERLIPIDEMIA (05/19/2007), HYPERTENSION (05/19/2007), HYPERTHYROIDISM (03/10/2009), Lateral epicondylitis  of elbow (04/21/2008), Seasonal allergies, SINUSITIS- ACUTE-NOS (05/11/2010), and SMOKER (05/19/2007).  Vitamin D deficiency Last vitamin D Lab Results  Component Value Date   VD25OH 38.38 05/23/2023   Low, to start oral replacement   Hypothyroidism Lab Results  Component Value Date   TSH 2.36 05/23/2023   Stable, pt to continue levothyroxine 50 mcg qd   Hyperlipidemia Lab Results  Component Value Date   LDLCALC 82 05/23/2023   Stable, pt to continue current statin lipitor 80 mg every day, zetia 10 every day - declines add repatha for now   Essential hypertension BP Readings from Last 3 Encounters:  05/23/23 110/64  11/22/22 128/80  05/10/22 120/66   Stable, pt to continue medical treatment norvasc 10 qd   Diabetes Lab Results  Component Value Date   HGBA1C 6.8 (H) 05/23/2023   Stable, pt to continue current medical treatment metformin ER 500 mg - 1 qd   B12 deficiency Lab Results  Component Value Date   VITAMINB12 >1500 (H) 11/08/2022   Stable, cont oral replacement - b12 1000 mcg qd   Allergic rhinitis Mild to mod, for flonase asd, to f/u any worsening symptoms or concerns Followup: Return in about 6  months (around 11/21/2023).  Oliver Barre, MD 05/25/2023 9:06 PM [facility]

## 2023-05-23 NOTE — Patient Instructions (Signed)

## 2023-05-25 ENCOUNTER — Encounter: Payer: Self-pay | Admitting: Internal Medicine

## 2023-05-25 NOTE — Assessment & Plan Note (Signed)
Mild to mod, for flonase asd,  to f/u any worsening symptoms or concerns  

## 2023-05-25 NOTE — Assessment & Plan Note (Signed)
Lab Results  Component Value Date   VITAMINB12 >1500 (H) 11/08/2022   Stable, cont oral replacement - b12 1000 mcg qd

## 2023-05-25 NOTE — Assessment & Plan Note (Signed)
Lab Results  Component Value Date   TSH 2.36 05/23/2023   Stable, pt to continue levothyroxine 50 mcg qd

## 2023-05-25 NOTE — Assessment & Plan Note (Signed)
Last vitamin D Lab Results  Component Value Date   VD25OH 38.38 05/23/2023   Low, to start oral replacement

## 2023-05-25 NOTE — Assessment & Plan Note (Signed)
BP Readings from Last 3 Encounters:  05/23/23 110/64  11/22/22 128/80  05/10/22 120/66   Stable, pt to continue medical treatment norvasc 10 qd

## 2023-05-25 NOTE — Assessment & Plan Note (Signed)
Lab Results  Component Value Date   LDLCALC 82 05/23/2023   Stable, pt to continue current statin lipitor 80 mg every day, zetia 10 every day - declines add repatha for now

## 2023-05-25 NOTE — Assessment & Plan Note (Signed)
Lab Results  Component Value Date   HGBA1C 6.8 (H) 05/23/2023   Stable, pt to continue current medical treatment metformin ER 500 mg - 1 qd

## 2023-07-02 ENCOUNTER — Other Ambulatory Visit: Payer: Self-pay | Admitting: Internal Medicine

## 2023-07-02 ENCOUNTER — Other Ambulatory Visit: Payer: Self-pay

## 2023-07-17 ENCOUNTER — Telehealth: Payer: Medicare HMO | Admitting: Family Medicine

## 2023-07-17 ENCOUNTER — Ambulatory Visit: Payer: Self-pay | Admitting: Internal Medicine

## 2023-07-17 DIAGNOSIS — R6889 Other general symptoms and signs: Secondary | ICD-10-CM | POA: Diagnosis not present

## 2023-07-17 MED ORDER — OSELTAMIVIR PHOSPHATE 75 MG PO CAPS: 75.0000 mg | ORAL_CAPSULE | Freq: Two times a day (BID) | ORAL | 0 refills | Status: AC

## 2023-07-17 MED ORDER — BENZONATATE 100 MG PO CAPS: 100.0000 mg | ORAL_CAPSULE | Freq: Three times a day (TID) | ORAL | 0 refills | Status: AC | PRN

## 2023-07-17 NOTE — Patient Instructions (Signed)
 Erica Mills, thank you for joining Erica CHRISTELLA Barefoot, NP for today's virtual visit.  While this provider is not your primary care provider (PCP), if your PCP is located in our provider database this encounter information will be shared with them immediately following your visit.   A [facility] MyChart account gives you access to today's visit and all your visits, tests, and labs performed at [facility]  click here if you don't have a  MyChart account or go to [email or web address]  Consent: (Patient) Erica Mills provided verbal consent for this virtual visit at the beginning of the encounter.  Current Medications:  Current Outpatient Medications:    benzonatate  (TESSALON ) 100 MG capsule, Take 1 capsule (100 mg total) by mouth 3 (three) times daily as needed for cough., Disp: 30 capsule, Rfl: 0   oseltamivir  (TAMIFLU ) 75 MG capsule, Take 1 capsule (75 mg total) by mouth 2 (two) times daily for 5 days., Disp: 10 capsule, Rfl: 0   amLODipine  (NORVASC ) 10 MG tablet, TAKE 1 TABLET BY MOUTH EVERY DAY, Disp: 90 tablet, Rfl: 3   amoxicillin -clavulanate (AUGMENTIN ) 875-125 MG tablet, Take 1 tablet by mouth 2 (two) times daily. Take with food (Patient not taking: Reported on 05/23/2023), Disp: 20 tablet, Rfl: 0   aspirin 81 MG tablet, Take 81 mg by mouth daily., Disp: , Rfl:    Aspirin-Calcium  Carbonate (BAYER WOMENS) 81-777 MG TABS, Take 81 mg by mouth daily at 2 am., Disp: , Rfl:    atorvastatin  (LIPITOR) 80 MG tablet, TAKE 1 TABLET BY MOUTH EVERY DAY, Disp: 90 tablet, Rfl: 3   BIOTIN PO, Take 1 tablet by mouth daily at 6 (six) AM., Disp: , Rfl:    cetirizine  (ZYRTEC ) 10 MG tablet, Take 1 tablet (10 mg total) by mouth daily., Disp: 30 tablet, Rfl: 11   Cholecalciferol (VITAMIN D3 PO), Take 1 tablet by mouth daily at 6 (six) AM., Disp: , Rfl:    diclofenac  sodium (VOLTAREN ) 1 % GEL, Apply 2 g topically 4 (four) times daily as needed., Disp: 200 g, Rfl: 3   ezetimibe   (ZETIA ) 10 MG tablet, TAKE 1 TABLET BY MOUTH EVERY DAY, Disp: 90 tablet, Rfl: 3   fluticasone  (FLONASE ) 50 MCG/ACT nasal spray, Place 2 sprays into both nostrils daily., Disp: 16 g, Rfl: 6   gabapentin  (NEURONTIN ) 100 MG capsule, TAKE 1 CAPSULE BY MOUTH AT BEDTIME., Disp: 30 capsule, Rfl: 2   levothyroxine  (SYNTHROID ) 50 MCG tablet, TAKE 1 TABLET BY MOUTH EVERY DAY, Disp: 90 tablet, Rfl: 3   lidocaine  (LIDODERM ) 5 %, Place 1 patch onto the skin daily. Remove & Discard patch within 12 hours or as directed by MD, Disp: 40 patch, Rfl: 2   metFORMIN  (GLUCOPHAGE -XR) 500 MG 24 hr tablet, TAKE 1 TABLET BY MOUTH EVERY DAY WITH BREAKFAST, Disp: 90 tablet, Rfl: 3   Multiple Vitamins-Minerals (ZINC PO), Take 1 tablet by mouth daily at 6 (six) AM., Disp: , Rfl:    neomycin -polymyxin-hydrocortisone (CORTISPORIN) OTIC solution, Place 4 drops into both ears 4 (four) times daily., Disp: 10 mL, Rfl: 2   sertraline  (ZOLOFT ) 50 MG tablet, TAKE 1 TABLET BY MOUTH EVERY DAY, Disp: 90 tablet, Rfl: 2   triamcinolone  cream (KENALOG ) 0.1 %, Apply 1 application topically 4 (four) times daily. As needed for rash, Disp: 30 g, Rfl: 0   Medications ordered in this encounter:  Meds ordered this encounter  Medications   oseltamivir  (TAMIFLU ) 75 MG capsule    Sig: Take 1 capsule (  75 mg total) by mouth 2 (two) times daily for 5 days.    Dispense:  10 capsule    Refill:  0    Supervising Provider:   LAMPTEY, PHILIP O [[id number]]   benzonatate  (TESSALON ) 100 MG capsule    Sig: Take 1 capsule (100 mg total) by mouth 3 (three) times daily as needed for cough.    Dispense:  30 capsule    Refill:  0    Supervising Provider:   BLAISE ALEENE KIDD [[id number]]     *If you need refills on other medications prior to your next appointment, please contact your pharmacy*  Follow-Up: Call back or seek an in-person evaluation if the symptoms worsen or if the condition fails to improve as anticipated.  [facility] [phone]  Other Instructions  - Increased rest - Increasing Fluids - Acetaminophen / ibuprofen as needed for fever/pain.  - Salt water gargling, chloraseptic spray and throat lozenges - Mucinex if mucus is present and increasing.  - Saline nasal spray if congestion or if nasal passages feel dry. - Humidifying the air.     If you have been instructed to have an in-person evaluation today at a local Urgent Care facility, please use the link below. It will take you to a list of all of our available [facility], including address, phone number and hours of operation. Please do not delay care.  [facility]  If you or a family member do not have a primary care provider, use the link below to schedule a visit and establish care. When you choose a [facility] primary care physician or advanced practice provider, you gain a long-term partner in health. Find a Primary Care Provider  Learn more about [facility]'s in-office and virtual care options: [facility] - Get Care Now

## 2023-07-17 NOTE — Telephone Encounter (Signed)
 Chief Complaint: Flu-like symptoms with exposure Symptoms: cough, fever, stuffy nose, wheezing, sore throat Frequency: 3 days Pertinent Negatives: Patient denies SOB at rest, CP, NVD Disposition: [] ED /[] Urgent Care (no appt availability in office) / [x] Appointment(In office/virtual)/ []  [facility]/ [] Home Care/ [] Refused Recommended Disposition /[] [facility]/ []  Follow-up with PCP Additional Notes: Patient calls reporting flu-like symptoms x 3 days after exposure to granddaughter who was flu positive. Per protocol, patient to be evaluated within 24 hours. No availability with PCP in that time frame, patient requests virtual visit with any provider. Scheduled for 1500 today. Care advice reviewed, patient verbalized understanding. Alerting PCP for review.   Copied from CRM [id number]. Topic: Clinical - Medication Question >> Jul 17, 2023  8:11 AM Isabell A wrote: Reason for CRM: Patient is requesting a medication - states she's experiencing sore throat, runny nose, cough & no fever. Reason for Disposition  Patient is HIGH RISK (e.g., age > 64 years, pregnant, HIV+, or chronic medical condition)  Answer Assessment - Initial Assessment Questions 1. TYPE of EXPOSURE: How were you exposed? (e.g., close contact, not a close contact)     Granddaughter is positive for flu, spent time time with her 4 days ago 2. DATE of EXPOSURE: When did the exposure occur? (e.g., hour, days, weeks)     07/13/23 3. PREGNANCY: Is there any chance you are pregnant? When was your last menstrual period?     Denies 4. HIGH RISK for COMPLICATIONS: Do you have any heart or lung problems? Do you have a weakened immune system? (e.g., CHF, COPD, asthma, HIV positive, chemotherapy, renal failure, diabetes mellitus, sickle cell anemia)     Denies 5. SYMPTOMS: Do you have any symptoms? (e.g., cough, fever, sore throat, difficulty breathing).     Cough- nonproductive, stuffy nose, fever,  wheezing, sore throat  Has been taking mucinex and cough drops.  Answer Assessment - Initial Assessment Questions 1. WORST SYMPTOM: What is your worst symptom? (e.g., cough, runny nose, muscle aches, headache, sore throat, fever)      Cough 2. ONSET: When did your flu symptoms start?      3 days ago 3. COUGH: How bad is the cough?       When coughing, it hurts- not constant but severe when it occurs. 4. RESPIRATORY DISTRESS: Describe your breathing.      States if she were to walk up stairs she would feel SOB, but not at rest. 5. FEVER: Do you have a fever? If Yes, ask: What is your temperature, how was it measured, and when did it start?     States it is 98.7 6. EXPOSURE: Were you exposed to someone with influenza?       Yes, granddaughter 4 days ago 7. FLU VACCINE: Did you get a flu shot this year?     Yes 8. HIGH RISK DISEASE: Do you have any chronic medical problems? (e.g., heart or lung disease, asthma, weak immune system, or other HIGH RISK conditions)     Denies 9. PREGNANCY: Is there any chance you are pregnant? When was your last menstrual period?     Denies 10. OTHER SYMPTOMS: Do you have any other symptoms?  (e.g., runny nose, muscle aches, headache, sore throat)       Answer Assessment - Initial Assessment Questions 1. TYPE of EXPOSURE: How were you exposed? (e.g., close contact, not a close contact)     Granddaughter is positive for flu, spent time time with her 4 days ago  2. DATE of EXPOSURE: When did the exposure occur? (e.g., hour, days, weeks)     07/13/23 3. PREGNANCY: Is there any chance you are pregnant? When was your last menstrual period?     Denies 4. HIGH RISK for COMPLICATIONS: Do you have any heart or lung problems? Do you have a weakened immune system? (e.g., CHF, COPD, asthma, HIV positive, chemotherapy, renal failure, diabetes mellitus, sickle cell anemia)     Denies 5. SYMPTOMS: Do you have any symptoms? (e.g., cough,  fever, sore throat, difficulty breathing).     Cough- nonproductive, stuffy nose, fever, wheezing, sore throat  Has been taking mucinex and cough drops.  Protocols used: Influenza (Flu) Exposure-A-AH  Protocols used: Influenza (Flu) Exposure-A-AH, Influenza (Flu) - Seasonal-A-AH

## 2023-07-17 NOTE — Progress Notes (Signed)
 Virtual Visit Consent   Erica Mills, you are scheduled for a virtual visit with a [facility] provider today. Just as with appointments in the office, your consent must be obtained to participate. Your consent will be active for this visit and any virtual visit you may have with one of our providers in the next 365 days. If you have a MyChart account, a copy of this consent can be sent to you electronically.  As this is a virtual visit, video technology does not allow for your provider to perform a traditional examination. This may limit your provider's ability to fully assess your condition. If your provider identifies any concerns that need to be evaluated in person or the need to arrange testing (such as labs, EKG, etc.), we will make arrangements to do so. Although advances in technology are sophisticated, we cannot ensure that it will always work on either your end or our end. If the connection with a video visit is poor, the visit may have to be switched to a telephone visit. With either a video or telephone visit, we are not always able to ensure that we have a secure connection.  By engaging in this virtual visit, you consent to the provision of healthcare and authorize for your insurance to be billed (if applicable) for the services provided during this visit. Depending on your insurance coverage, you may receive a charge related to this service.  I need to obtain your verbal consent now. Are you willing to proceed with your visit today? Erica Mills has provided verbal consent on 07/17/2023 for a virtual visit (video or telephone). Erica CHRISTELLA Barefoot, NP  Date: 07/17/2023 3:13 PM  Virtual Visit via Video Note   I, Erica Mills, [name]  ([id number], 01/05/1956) on 07/17/23 at  3:00 PM EST by a video-enabled telemedicine application and verified that I am speaking with the correct person using two identifiers.  Location: Patient: Virtual Visit Location Patient:  Home Provider: Virtual Visit Location Provider: Home Office   I discussed the limitations of evaluation and management by telemedicine and the availability of in person appointments. The patient expressed understanding and agreed to proceed.    History of Present Illness: Erica Mills is a 68 y.o. who identifies as a female who was assigned female at birth, and is being seen today for Flu exposure and symptoms  Onset was yesterday with sore throat, cough- dry, and congestion  Associated symptoms are headaches and bodyaches  Modifying factors are mucinex and nasal spray  Denies chest pain, shortness of breath, fevers, chills  Exposure to sick contacts- known- granddaughter + for FLu   Problems:  Patient Active Problem List   Diagnosis Date Noted   B12 deficiency 05/11/2022   Mammographic calcification found on diagnostic imaging of breast 01/13/2022   Low back pain due to bilateral sciatica 11/11/2021   Polyp of cervix 11/07/2021   Uterine leiomyoma 11/07/2021   Obesity 11/07/2021   Gastroesophageal reflux disease 09/12/2021   Cobalamin deficiency 09/12/2021   Vitamin D  deficiency 05/20/2021   Acute on chronic renal insufficiency 07/14/2020   Hypothyroidism 07/14/2020   Abnormal LFTs 07/14/2020   Type 2 diabetes mellitus without complication, without long-term current use of insulin (HCC) 04/06/2019   CKD (chronic kidney disease) stage 3, GFR 30-59 ml/min (HCC) 05/29/2018   Lumbar degenerative disc disease 05/30/2017   Proteinuria 05/30/2017   Right lumbar radiculitis 11/29/2016   Cyst of left breast 12/22/2015  Itch 11/03/2015   Eustachian tube dysfunction 03/24/2015   Lower back pain 03/24/2015   Rash and nonspecific skin eruption 09/03/2013   Multinodular goiter 12/04/2012   Diabetes (HCC) 02/05/2011   Encounter for well adult exam with abnormal findings 02/05/2011   Hyperlipidemia 05/19/2007   Former smoker 05/19/2007   CARPAL TUNNEL SYNDROME, BILATERAL 05/19/2007    Essential hypertension 05/19/2007   Allergic rhinitis 05/19/2007   Carpal tunnel syndrome, bilateral 05/19/2007    Allergies:  Allergies  Allergen Reactions   Crestor  [Rosuvastatin ] Other (See Comments)    myalgia   Sulfonamide Derivatives     REACTION: rash   Medications:  Current Outpatient Medications:    amLODipine  (NORVASC ) 10 MG tablet, TAKE 1 TABLET BY MOUTH EVERY DAY, Disp: 90 tablet, Rfl: 3   amoxicillin -clavulanate (AUGMENTIN ) 875-125 MG tablet, Take 1 tablet by mouth 2 (two) times daily. Take with food (Patient not taking: Reported on 05/23/2023), Disp: 20 tablet, Rfl: 0   aspirin 81 MG tablet, Take 81 mg by mouth daily., Disp: , Rfl:    Aspirin-Calcium  Carbonate (BAYER WOMENS) 81-777 MG TABS, Take 81 mg by mouth daily at 2 am., Disp: , Rfl:    atorvastatin  (LIPITOR) 80 MG tablet, TAKE 1 TABLET BY MOUTH EVERY DAY, Disp: 90 tablet, Rfl: 3   BIOTIN PO, Take 1 tablet by mouth daily at 6 (six) AM., Disp: , Rfl:    cetirizine  (ZYRTEC ) 10 MG tablet, Take 1 tablet (10 mg total) by mouth daily., Disp: 30 tablet, Rfl: 11   Cholecalciferol (VITAMIN D3 PO), Take 1 tablet by mouth daily at 6 (six) AM., Disp: , Rfl:    diclofenac  sodium (VOLTAREN ) 1 % GEL, Apply 2 g topically 4 (four) times daily as needed., Disp: 200 g, Rfl: 3   ezetimibe  (ZETIA ) 10 MG tablet, TAKE 1 TABLET BY MOUTH EVERY DAY, Disp: 90 tablet, Rfl: 3   fluticasone  (FLONASE ) 50 MCG/ACT nasal spray, Place 2 sprays into both nostrils daily., Disp: 16 g, Rfl: 6   gabapentin  (NEURONTIN ) 100 MG capsule, TAKE 1 CAPSULE BY MOUTH AT BEDTIME., Disp: 30 capsule, Rfl: 2   levothyroxine  (SYNTHROID ) 50 MCG tablet, TAKE 1 TABLET BY MOUTH EVERY DAY, Disp: 90 tablet, Rfl: 3   lidocaine  (LIDODERM ) 5 %, Place 1 patch onto the skin daily. Remove & Discard patch within 12 hours or as directed by MD, Disp: 40 patch, Rfl: 2   metFORMIN  (GLUCOPHAGE -XR) 500 MG 24 hr tablet, TAKE 1 TABLET BY MOUTH EVERY DAY WITH BREAKFAST, Disp: 90 tablet, Rfl: 3    Multiple Vitamins-Minerals (ZINC PO), Take 1 tablet by mouth daily at 6 (six) AM., Disp: , Rfl:    neomycin -polymyxin-hydrocortisone (CORTISPORIN) OTIC solution, Place 4 drops into both ears 4 (four) times daily., Disp: 10 mL, Rfl: 2   sertraline  (ZOLOFT ) 50 MG tablet, TAKE 1 TABLET BY MOUTH EVERY DAY, Disp: 90 tablet, Rfl: 2   triamcinolone  cream (KENALOG ) 0.1 %, Apply 1 application topically 4 (four) times daily. As needed for rash, Disp: 30 g, Rfl: 0  Observations/Objective: Patient is well-developed, well-nourished in no acute distress.  Resting comfortably  at home.  Head is normocephalic, atraumatic.  No labored breathing.  Speech is clear and coherent with logical content.  Patient is alert and oriented at baseline.  Cough  Assessment and Plan:    1. Flu-like symptoms (Primary)  - oseltamivir  (TAMIFLU ) 75 MG capsule; Take 1 capsule (75 mg total) by mouth 2 (two) times daily for 5 days.  Dispense: 10 capsule; Refill:  0 - benzonatate  (TESSALON ) 100 MG capsule; Take 1 capsule (100 mg total) by mouth 3 (three) times daily as needed for cough.  Dispense: 30 capsule; Refill: 0  - Increased rest - Increasing Fluids - Acetaminophen / ibuprofen as needed for fever/pain.  - Salt water gargling, chloraseptic spray and throat lozenges - Mucinex if mucus is present and increasing.  - Saline nasal spray if congestion or if nasal passages feel dry. - Humidifying the air.   Reviewed side effects, risks and benefits of medication.    Patient acknowledged agreement and understanding of the plan.   Past Medical, Surgical, Social History, Allergies, and Medications have been Reviewed.    Follow Up Instructions: I discussed the assessment and treatment plan with the patient. The patient was provided an opportunity to ask questions and all were answered. The patient agreed with the plan and demonstrated an understanding of the instructions.  A copy of instructions were sent to the patient  via MyChart unless otherwise noted below.    The patient was advised to call back or seek an in-person evaluation if the symptoms worsen or if the condition fails to improve as anticipated.    Erica CHRISTELLA Barefoot, NP

## 2023-07-21 ENCOUNTER — Other Ambulatory Visit: Payer: Self-pay | Admitting: Internal Medicine

## 2023-07-21 ENCOUNTER — Other Ambulatory Visit: Payer: Self-pay

## 2023-08-22 ENCOUNTER — Ambulatory Visit
Admission: RE | Admit: 2023-08-22 | Discharge: 2023-08-22 | Disposition: A | Discharge status: Home / Self Care | Payer: Medicare HMO | Source: Ambulatory Visit | Attending: Obstetrics and Gynecology | Admitting: Obstetrics and Gynecology

## 2023-08-22 ENCOUNTER — Other Ambulatory Visit: Payer: Self-pay | Admitting: Obstetrics and Gynecology

## 2023-08-22 DIAGNOSIS — R921 Mammographic calcification found on diagnostic imaging of breast: Secondary | ICD-10-CM

## 2023-10-24 DIAGNOSIS — Z13820 Encounter for screening for osteoporosis: Secondary | ICD-10-CM | POA: Diagnosis not present

## 2023-10-24 DIAGNOSIS — Z124 Encounter for screening for malignant neoplasm of cervix: Secondary | ICD-10-CM | POA: Diagnosis not present

## 2023-10-24 DIAGNOSIS — Z01419 Encounter for gynecological examination (general) (routine) without abnormal findings: Secondary | ICD-10-CM | POA: Diagnosis not present

## 2023-10-24 DIAGNOSIS — Z1211 Encounter for screening for malignant neoplasm of colon: Secondary | ICD-10-CM | POA: Diagnosis not present

## 2023-10-24 DIAGNOSIS — Z1231 Encounter for screening mammogram for malignant neoplasm of breast: Secondary | ICD-10-CM | POA: Diagnosis not present

## 2023-10-24 DIAGNOSIS — Z1330 Encounter for screening examination for mental health and behavioral disorders, unspecified: Secondary | ICD-10-CM | POA: Diagnosis not present

## 2023-12-25 ENCOUNTER — Other Ambulatory Visit: Payer: Self-pay | Admitting: Internal Medicine

## 2024-01-09 DIAGNOSIS — E785 Hyperlipidemia, unspecified: Secondary | ICD-10-CM | POA: Diagnosis not present

## 2024-01-09 DIAGNOSIS — Z87891 Personal history of nicotine dependence: Secondary | ICD-10-CM | POA: Diagnosis not present

## 2024-01-09 DIAGNOSIS — E119 Type 2 diabetes mellitus without complications: Secondary | ICD-10-CM | POA: Diagnosis not present

## 2024-01-09 DIAGNOSIS — Z8249 Family history of ischemic heart disease and other diseases of the circulatory system: Secondary | ICD-10-CM | POA: Diagnosis not present

## 2024-01-09 DIAGNOSIS — M1990 Unspecified osteoarthritis, unspecified site: Secondary | ICD-10-CM | POA: Diagnosis not present

## 2024-01-09 DIAGNOSIS — Z7984 Long term (current) use of oral hypoglycemic drugs: Secondary | ICD-10-CM | POA: Diagnosis not present

## 2024-01-09 DIAGNOSIS — E669 Obesity, unspecified: Secondary | ICD-10-CM | POA: Diagnosis not present

## 2024-01-09 DIAGNOSIS — Z6831 Body mass index (BMI) 31.0-31.9, adult: Secondary | ICD-10-CM | POA: Diagnosis not present

## 2024-01-09 DIAGNOSIS — I10 Essential (primary) hypertension: Secondary | ICD-10-CM | POA: Diagnosis not present

## 2024-01-09 DIAGNOSIS — Z882 Allergy status to sulfonamides status: Secondary | ICD-10-CM | POA: Diagnosis not present

## 2024-01-09 DIAGNOSIS — Z833 Family history of diabetes mellitus: Secondary | ICD-10-CM | POA: Diagnosis not present

## 2024-01-09 DIAGNOSIS — Z7982 Long term (current) use of aspirin: Secondary | ICD-10-CM | POA: Diagnosis not present

## 2024-02-06 ENCOUNTER — Ambulatory Visit (INDEPENDENT_AMBULATORY_CARE_PROVIDER_SITE_OTHER)

## 2024-02-06 ENCOUNTER — Ambulatory Visit: Admitting: Podiatry

## 2024-02-06 ENCOUNTER — Encounter: Payer: Self-pay | Admitting: Podiatry

## 2024-02-06 VITALS — Ht 63.0 in | Wt 183.0 lb

## 2024-02-06 DIAGNOSIS — M7752 Other enthesopathy of left foot: Secondary | ICD-10-CM | POA: Diagnosis not present

## 2024-02-06 DIAGNOSIS — M722 Plantar fascial fibromatosis: Secondary | ICD-10-CM

## 2024-02-06 DIAGNOSIS — M7662 Achilles tendinitis, left leg: Secondary | ICD-10-CM | POA: Diagnosis not present

## 2024-02-06 MED ORDER — TRIAMCINOLONE ACETONIDE 10 MG/ML IJ SUSP
10.0000 mg | Freq: Once | INTRAMUSCULAR | Status: AC
  Administered 2024-02-06: 10 mg via INTRA_ARTICULAR

## 2024-02-06 NOTE — Progress Notes (Signed)
 Subjective:   Patient ID: Erica Mills, female   DOB: 68 y.o.   MRN: [id number]   HPI Patient states she is getting a lot of pain in the back of her left heel that is been present for a number of months and also has some discomfort in her forefoot left that is hard for her to describe.  States that does not bother her like the heel and states the heel is becoming a problem that she could not walk or wear shoe gear with with any degree of comfort.  Patient does not smoke likes to be active   Review of Systems  All other systems reviewed and are negative.       Objective:  Physical Exam Vitals and nursing note reviewed.  Constitutional:      Appearance: She is well-developed.  Pulmonary:     Effort: Pulmonary effort is normal.  Musculoskeletal:        General: Normal range of motion.  Skin:    General: Skin is warm.  Neurological:     Mental Status: She is alert.     Neurovascular status intact muscle strength adequate range of motion adequate with exquisite discomfort posterior aspect left heel at the insertion of the Achilles into the heel with no central medial involvement with a normal Achilles tendon strength.  Patient is noted to have mild discomfort forefoot left around the 2nd and 3rd MPJ is localized to this area and has good digital perfusion well-oriented x 3     Assessment:  Achilles tendinitis left with fluid buildup along with forefoot possible capsulitis or neuroma symptomatology     Plan:  H&P all conditions reviewed discussed the acuteness of the posterior heel and reviewed treatment processes.  I have recommended careful injection I explained procedure and the risk of rupture associated with this and she wants to undergo this and I carefully did a sterile prep and injected just the lateral side staying away from the central and medial 3 mg Dexasone Kenalog  5 mg Xylocaine .  I then applied air fracture walker properly fitted to the lower leg to take all pressure off  the area to prevent movement and help to heal the area.  Patient will be seen back to recheck 4 weeks or earlier if necessary and we will not have current treatment of the forefoot but may need this treated in the future  X-rays indicate large posterior spur no indications of forefoot pathology currently

## 2024-02-15 ENCOUNTER — Other Ambulatory Visit: Payer: Self-pay | Admitting: Internal Medicine

## 2024-02-27 ENCOUNTER — Other Ambulatory Visit: Payer: Self-pay | Admitting: Obstetrics and Gynecology

## 2024-02-27 ENCOUNTER — Ambulatory Visit
Admission: RE | Admit: 2024-02-27 | Discharge: 2024-02-27 | Disposition: A | Discharge status: Home / Self Care | Source: Ambulatory Visit | Attending: Obstetrics and Gynecology | Admitting: Obstetrics and Gynecology

## 2024-02-27 DIAGNOSIS — N6002 Solitary cyst of left breast: Secondary | ICD-10-CM | POA: Diagnosis not present

## 2024-02-27 DIAGNOSIS — R928 Other abnormal and inconclusive findings on diagnostic imaging of breast: Secondary | ICD-10-CM

## 2024-02-27 DIAGNOSIS — N6342 Unspecified lump in left breast, subareolar: Secondary | ICD-10-CM

## 2024-02-27 DIAGNOSIS — R921 Mammographic calcification found on diagnostic imaging of breast: Secondary | ICD-10-CM

## 2024-03-02 ENCOUNTER — Encounter: Payer: Self-pay | Admitting: *Deleted

## 2024-03-02 NOTE — Progress Notes (Signed)
 Erica Mills                                          MRN: [id number]   03/02/2024   The [facility] Quality Team Specialist reviewed this patient medical record for the purposes of chart review for care gap closure. The following were reviewed: abstraction for care gap closure-breast cancer screening.    [facility] Quality Team

## 2024-03-02 NOTE — Progress Notes (Signed)
 MIONNA ADVINCULA                                          MRN: [id number]   03/02/2024   The [facility] Quality Team Specialist reviewed this patient medical record for the purposes of chart review for care gap closure. The following were reviewed: chart review for care gap closure-controlling blood pressure, diabetic eye exam, and glycemic status assessment.    [facility] Quality Team

## 2024-03-02 NOTE — Progress Notes (Signed)
 Erica Mills                                          MRN: [id number]   03/02/2024   The [facility] Quality Team Specialist reviewed this patient medical record for the purposes of chart review for care gap closure. The following were reviewed: chart review for care gap closure-breast cancer screening.    [facility] Quality Team

## 2024-03-05 ENCOUNTER — Encounter: Payer: Self-pay | Admitting: Podiatry

## 2024-03-05 ENCOUNTER — Ambulatory Visit: Admitting: Podiatry

## 2024-03-05 DIAGNOSIS — M7752 Other enthesopathy of left foot: Secondary | ICD-10-CM

## 2024-03-05 DIAGNOSIS — M7662 Achilles tendinitis, left leg: Secondary | ICD-10-CM

## 2024-03-08 NOTE — Progress Notes (Signed)
 Subjective:   Patient ID: Erica Mills, female   DOB: 68 y.o.   MRN: [id number]   HPI Patient states feeling much better very pleased at this time with recovered   ROS      Objective:  Physical Exam  Neurovascular status intact with patient found to have significant diminishment of discomfort in the heel region bilateral with minimal discomfort when I pressed into the area and patient walking with a better heel-toe gait     Assessment:  Patient is improving from fascial inflammation and appears to be approximately 95% better     Plan:  H&P reviewed and recommended the continuation of supportive therapy the continuation of anti-inflammatories and shoe gear modification.  Discharge reappoint as needed

## 2024-03-12 ENCOUNTER — Other Ambulatory Visit: Payer: Self-pay | Admitting: Obstetrics and Gynecology

## 2024-03-12 ENCOUNTER — Ambulatory Visit
Admission: RE | Admit: 2024-03-12 | Discharge: 2024-03-12 | Disposition: A | Discharge status: Home / Self Care | Source: Ambulatory Visit | Attending: Obstetrics and Gynecology | Admitting: Obstetrics and Gynecology

## 2024-03-12 DIAGNOSIS — N6002 Solitary cyst of left breast: Secondary | ICD-10-CM

## 2024-03-12 DIAGNOSIS — R921 Mammographic calcification found on diagnostic imaging of breast: Secondary | ICD-10-CM

## 2024-03-12 DIAGNOSIS — N6012 Diffuse cystic mastopathy of left breast: Secondary | ICD-10-CM | POA: Diagnosis not present

## 2024-03-12 DIAGNOSIS — N6325 Unspecified lump in the left breast, overlapping quadrants: Secondary | ICD-10-CM | POA: Diagnosis not present

## 2024-03-12 DIAGNOSIS — N6342 Unspecified lump in left breast, subareolar: Secondary | ICD-10-CM

## 2024-03-12 HISTORY — PX: BREAST BIOPSY: SHX20

## 2024-03-15 LAB — SURGICAL PATHOLOGY

## 2024-03-22 ENCOUNTER — Other Ambulatory Visit: Payer: Self-pay | Admitting: Internal Medicine

## 2024-03-25 NOTE — Progress Notes (Signed)
 Erica Mills                                          MRN: [id number]   03/25/2024   The [facility] Quality Team Specialist reviewed this patient medical record for the purposes of chart review for care gap closure. The following were reviewed: chart review for care gap closure-controlling blood pressure, glycemic status assessment, and kidney health evaluation for diabetes:eGFR  and uACR.    [facility] Quality Team

## 2024-04-02 ENCOUNTER — Ambulatory Visit (INDEPENDENT_AMBULATORY_CARE_PROVIDER_SITE_OTHER): Payer: Medicare HMO

## 2024-04-02 VITALS — Ht 63.0 in | Wt 172.0 lb

## 2024-04-02 DIAGNOSIS — E119 Type 2 diabetes mellitus without complications: Secondary | ICD-10-CM

## 2024-04-02 DIAGNOSIS — Z0000 Encounter for general adult medical examination without abnormal findings: Secondary | ICD-10-CM | POA: Diagnosis not present

## 2024-04-02 DIAGNOSIS — Z0189 Encounter for other specified special examinations: Secondary | ICD-10-CM

## 2024-04-02 DIAGNOSIS — Z122 Encounter for screening for malignant neoplasm of respiratory organs: Secondary | ICD-10-CM

## 2024-04-02 DIAGNOSIS — Z87891 Personal history of nicotine dependence: Secondary | ICD-10-CM | POA: Diagnosis not present

## 2024-04-02 NOTE — Patient Instructions (Addendum)
 [name],  Thank you for taking the time for your Medicare Wellness Visit. I appreciate your continued commitment to your health goals. Please review the care plan we discussed, and feel free to reach out if I can assist you further.  Medicare recommends these wellness visits once per year to help you and your care team stay ahead of potential health issues. These visits are designed to focus on prevention, allowing your provider to concentrate on managing your acute and chronic conditions during your regular appointments.  Please note that Annual Wellness Visits do not include a physical exam. Some assessments may be limited, especially if the visit was conducted virtually. If needed, we may recommend a separate in-person follow-up with your provider.  Ongoing Care Seeing your primary care provider every 3 to 6 months helps us  monitor your health and provide consistent, personalized care.   Referrals If a referral was made during today's visit and you haven't received any updates within two weeks, please contact the referred provider directly to check on the status.  Recommended Screenings:  Health Maintenance  Topic Date Due   Yearly kidney health urinalysis for diabetes  Never done   Screening for Lung Cancer  Never done   Zoster (Shingles) Vaccine (2 of 2) 02/19/2023   Eye exam for diabetics  04/24/2023   Hemoglobin A1C  11/21/2023   Complete foot exam   11/22/2023   Flu Shot  01/09/2024   COVID-19 Vaccine (4 - 2025-26 season) 02/09/2024   Yearly kidney function blood test for diabetes  05/22/2024   Breast Cancer Screening  02/26/2025   Medicare Annual Wellness Visit  04/02/2025   DTaP/Tdap/Td vaccine (4 - Td or Tdap) 06/28/2029   Colon Cancer Screening  01/30/2031   Pneumococcal Vaccine for age over 64  Completed   DEXA scan (bone density measurement)  Completed   Hepatitis C Screening  Completed   Meningitis B Vaccine  Aged Out       04/02/2024    8:38 AM  Advanced  Directives  Does Patient Have a Medical Advance Directive? Yes  Type of Estate agent of [place];Living will  Copy of Healthcare Power of Attorney in Chart? No - copy requested   Advance Care Planning is important because it: Ensures you receive medical care that aligns with your values, goals, and preferences. Provides guidance to your family and loved ones, reducing the emotional burden of decision-making during critical moments.  Vision: Annual vision screenings are recommended for early detection of glaucoma, cataracts, and diabetic retinopathy. These exams can also reveal signs of chronic conditions such as diabetes and high blood pressure.  Dental: Annual dental screenings help detect early signs of oral cancer, gum disease, and other conditions linked to overall health, including heart disease and diabetes.  Please see the attached documents for additional preventive care recommendations.

## 2024-04-02 NOTE — Progress Notes (Signed)
 Subjective:   Erica Mills is a 68 y.o. who presents for a Medicare Wellness preventive visit.  As a reminder, Annual Wellness Visits don't include a physical exam, and some assessments may be limited, especially if this visit is performed virtually. We may recommend an in-person follow-up visit with your provider if needed.  Visit Complete: Virtual I [name] on 04/02/24 by a audio enabled telemedicine application and verified that I am speaking with the correct person using two identifiers.  Patient Location: Home  Provider Location: Office/Clinic  I discussed the limitations of evaluation and management by telemedicine. The patient expressed understanding and agreed to proceed.  Vital Signs: Because this visit was a virtual/telehealth visit, some criteria may be missing or patient reported. Any vitals not documented were not able to be obtained and vitals that have been documented are patient reported.  VideoDeclined- This patient declined Librarian, academic. Therefore the visit was completed with audio only.  Persons Participating in Visit: Patient.  AWV Questionnaire: No: Patient Medicare AWV questionnaire was not completed prior to this visit.  Cardiac Risk Factors include: advanced age (>77men, >78 women);diabetes mellitus;dyslipidemia;hypertension;obesity (BMI >30kg/m2)     Objective:    Today's Vitals   04/02/24 0837  Weight: 172 lb (78 kg)  Height: 5' 3 (1.6 m)   Body mass index is 30.47 kg/m.     04/02/2024    8:38 AM 03/28/2023    9:06 AM 03/22/2022   10:04 AM  Advanced Directives  Does Patient Have a Medical Advance Directive? Yes No No  Type of Estate agent of [place];Living will    Copy of Healthcare Power of Attorney in Chart? No - copy requested    Would patient like information on creating a medical advance directive?   No - Patient declined    Current Medications  (verified) Outpatient Encounter Medications as of 04/02/2024  Medication Sig   amLODipine  (NORVASC ) 10 MG tablet TAKE 1 TABLET BY MOUTH EVERY DAY. PATIENT DUE FOR PHYSICAL WITH PCP PRIOR TO FUTURE REFILLS   aspirin 81 MG tablet Take 81 mg by mouth daily.   Aspirin-Calcium  Carbonate (BAYER WOMENS) 81-777 MG TABS Take 81 mg by mouth daily at 2 am.   atorvastatin  (LIPITOR) 80 MG tablet TAKE 1 TABLET BY MOUTH EVERY DAY   benzonatate  (TESSALON ) 100 MG capsule Take 1 capsule (100 mg total) by mouth 3 (three) times daily as needed for cough.   BIOTIN PO Take 1 tablet by mouth daily at 6 (six) AM.   cetirizine  (ZYRTEC ) 10 MG tablet Take 1 tablet (10 mg total) by mouth daily.   Cholecalciferol (VITAMIN D3 PO) Take 1 tablet by mouth daily at 6 (six) AM.   diclofenac  sodium (VOLTAREN ) 1 % GEL Apply 2 g topically 4 (four) times daily as needed.   ezetimibe  (ZETIA ) 10 MG tablet TAKE 1 TABLET BY MOUTH EVERY DAY   fluticasone  (FLONASE ) 50 MCG/ACT nasal spray Place 2 sprays into both nostrils daily.   gabapentin  (NEURONTIN ) 100 MG capsule TAKE 1 CAPSULE BY MOUTH AT BEDTIME.   levothyroxine  (SYNTHROID ) 50 MCG tablet TAKE 1 TABLET BY MOUTH EVERY DAY   lidocaine  (LIDODERM ) 5 % Place 1 patch onto the skin daily. Remove & Discard patch within 12 hours or as directed by MD   metFORMIN  (GLUCOPHAGE -XR) 500 MG 24 hr tablet TAKE 1 TABLET BY MOUTH EVERY DAY WITH BREAKFAST   Multiple Vitamins-Minerals (ZINC PO) Take 1 tablet by mouth daily  at 6 (six) AM.   neomycin -polymyxin-hydrocortisone (CORTISPORIN) OTIC solution Place 4 drops into both ears 4 (four) times daily.   sertraline  (ZOLOFT ) 50 MG tablet TAKE 1 TABLET BY MOUTH EVERY DAY   triamcinolone  cream (KENALOG ) 0.1 % Apply 1 application topically 4 (four) times daily. As needed for rash   No facility-administered encounter medications on file as of 04/02/2024.    Allergies (verified) Crestor  [rosuvastatin ] and Sulfonamide derivatives   History: Past Medical  History:  Diagnosis Date   ACHILLES TENDINITIS 10/06/2009   ALLERGIC RHINITIS 05/19/2007   CARPAL TUNNEL SYNDROME, BILATERAL 05/19/2007   Cyst of breast, right, solitary 07/11/2012   Diabetes mellitus without complication (HCC)    on meds   ELBOW PAIN, RIGHT 04/21/2008   Fibroids    GERD (gastroesophageal reflux disease)    with certain foods/OTC meds   GLUCOSE INTOLERANCE 02/14/2009   H/O menorrhagia    HYPERLIPIDEMIA 05/19/2007   on meds   HYPERTENSION 05/19/2007   on meds   HYPERTHYROIDISM 03/10/2009   on meds   Lateral epicondylitis  of elbow 04/21/2008   Seasonal allergies    SINUSITIS- ACUTE-NOS 05/11/2010   SMOKER 05/19/2007   Past Surgical History:  Procedure Laterality Date   BREAST BIOPSY Left 03/12/2024   US  LT BREAST BX W LOC DEV 1ST LESION IMG BX SPEC US  GUIDE 03/12/2024 [facility] MAMMOGRAPHY   CESAREAN SECTION     COLONOSCOPY  2012   CG-F/V-movi (exc)-tics-10 yr recall   TONSILLECTOMY     Family History  Problem Relation Age of Onset   Hypertension Mother    Diabetes Mother    Diabetes Father    Hypertension Father    Diabetes Sister    Cancer Paternal Uncle        colon cancer   Colon cancer Neg Hx    Colon polyps Neg Hx    Esophageal cancer Neg Hx    Rectal cancer Neg Hx    Stomach cancer Neg Hx    Social History   Socioeconomic History   Marital status: Widowed    Spouse name: Not on file   Number of children: 3   Years of education: Not on file   Highest education level: Not on file  Occupational History   Occupation: retired  Tobacco Use   Smoking status: Former    Current packs/day: 0.00    Types: Cigarettes    Quit date: 06/10/2012    Years since quitting: 11.8   Smokeless tobacco: Never   Tobacco comments:    QUIT SMOKING X 4 WEEKS  Vaping Use   Vaping status: Never Used  Substance and Sexual Activity   Alcohol use: No   Drug use: No   Sexual activity: Yes    Birth control/protection: Surgical, Post-menopausal    Comment: BTL   Other Topics Concern   Not on file  Social History Narrative   Grandson lives with her.   Social Drivers of Corporate investment banker Strain: Low Risk  (04/02/2024)   Overall Financial Resource Strain (CARDIA)    Difficulty of Paying Living Expenses: Not hard at all  Food Insecurity: No Food Insecurity (04/02/2024)   Hunger Vital Sign    Worried About Running Out of Food in the Last Year: Never true    Ran Out of Food in the Last Year: Never true  Transportation Needs: No Transportation Needs (04/02/2024)   PRAPARE - Administrator, Civil Service (Medical): No    Lack of Transportation (  Non-Medical): No  Physical Activity: Insufficiently Active (04/02/2024)   Exercise Vital Sign    Days of Exercise per Week: 7 days    Minutes of Exercise per Session: 10 min  Stress: No Stress Concern Present (04/02/2024)   [employer] of Occupational Health - Occupational Stress Questionnaire    Feeling of Stress: Not at all  Social Connections: Moderately Integrated (04/02/2024)   Social Connection and Isolation Panel    Frequency of Communication with Friends and Family: More than three times a week    Frequency of Social Gatherings with Friends and Family: Twice a week    Attends Religious Services: More than 4 times per year    Active Member of [employer] or Organizations: Yes    Attends Banker Meetings: Never    Marital Status: Widowed    Tobacco Counseling Counseling given: Not Answered Tobacco comments: QUIT SMOKING X 4 WEEKS    Clinical Intake:  Pre-visit preparation completed: Yes  Pain : No/denies pain     BMI - recorded: 30.47 Nutritional Status: BMI > 30  Obese Nutritional Risks: None Diabetes: Yes CBG done?: No Did pt. bring in CBG monitor from home?: No  Lab Results  Component Value Date   HGBA1C 6.8 (H) 05/23/2023   HGBA1C 6.5 11/08/2022   HGBA1C 6.7 (H) 05/07/2022     How often do you need to have someone help you when you  read instructions, pamphlets, or other written materials from your doctor or pharmacy?: 1 - Never  Interpreter Needed?: No  Information entered by :: Verdie Saba, CMA   Activities of Daily Living     04/02/2024    8:43 AM  In your present state of health, do you have any difficulty performing the following activities:  Hearing? 1  Vision? 1  Difficulty concentrating or making decisions? 1  Walking or climbing stairs? 1  Dressing or bathing? 1  Doing errands, shopping? 1  Preparing Food and eating ? Y  Using the Toilet? Y  In the past six months, have you accidently leaked urine? Y  Do you have problems with loss of bowel control? Y  Managing your Medications? Y  Managing your Finances? Y  Housekeeping or managing your Housekeeping? Y    Patient Care Team: Norleen Lynwood ORN, MD as PCP - General Groat, Charlie Hamilton, MD as Consulting Physician (Ophthalmology) Magdalen Pasco RAMAN, DPM as Consulting Physician (Podiatry)  I have updated your Care Teams any recent Medical Services you may have received from other providers in the past year.     Assessment:   This is a routine wellness examination for [place].  Hearing/Vision screen Hearing Screening - Comments:: Denies hearing difficulties   Vision Screening - Comments:: Wears rx glasses - up to date with routine eye exams with [facility]   Goals Addressed               This Visit's Progress     Patient Stated (pt-stated)        Patient stated she plans to continue walking       Depression Screen     04/02/2024    8:45 AM 05/23/2023    9:11 AM 03/28/2023    9:11 AM 11/22/2022    9:00 AM 05/10/2022    9:54 AM 11/09/2021    9:41 AM 11/09/2021    9:05 AM  PHQ 2/9 Scores  PHQ - 2 Score 0 0 1 0 0 0 0  PHQ- 9 Score 0 0  1  0  0    Fall Risk     04/02/2024    8:45 AM 05/23/2023    9:11 AM 03/28/2023    9:06 AM 11/22/2022    9:00 AM 05/10/2022    9:54 AM  Fall Risk   Falls in the past year? 0 0 0 0 0  Number falls  in past yr: 0 0 0 0   Injury with Fall? 0 0 0 0 0  Risk for fall due to : No Fall Risks No Fall Risks No Fall Risks No Fall Risks No Fall Risks  Follow up Falls evaluation completed;Falls prevention discussed Falls evaluation completed Falls prevention discussed;Falls evaluation completed Falls evaluation completed Falls evaluation completed      Data saved with a previous flowsheet row definition    MEDICARE RISK AT HOME:  Medicare Risk at Home Any stairs in or around the home?: Yes If so, are there any without handrails?: No Home free of loose throw rugs in walkways, pet beds, electrical cords, etc?: Yes Adequate lighting in your home to reduce risk of falls?: Yes Life alert?: No Use of a cane, walker or w/c?: No Grab bars in the bathroom?: No Shower chair or bench in shower?: No Elevated toilet seat or a handicapped toilet?: No  TIMED UP AND GO:  Was the test performed?  No  Cognitive Function: 6CIT completed        04/02/2024    8:48 AM 03/28/2023    9:08 AM 03/22/2022   10:04 AM  6CIT Screen  What Year? 0 points 0 points 0 points  What month? 0 points 0 points 0 points  What time? 0 points 0 points 0 points  Count back from 20 0 points 0 points 0 points  Months in reverse 0 points 0 points 0 points  Repeat phrase 0 points 2 points 0 points  Total Score 0 points 2 points 0 points    Immunizations Immunization History  Administered Date(s) Administered   Fluad Quad(high Dose 65+) 07/14/2020, 05/10/2022   Fluad Trivalent(High Dose 65+) 05/23/2023   Influenza Split 04/02/2013, 02/25/2019   Influenza Whole 04/13/2007   Influenza, Quadrivalent, Recombinant, Inj, Pf 02/22/2019   Influenza,inj,Quad PF,6+ Mos 03/05/2014, 03/24/2015, 05/17/2016, 03/08/2017   Influenza-Unspecified 03/05/2014, 03/24/2015, 05/17/2016, 02/25/2019   Moderna Sars-Covid-2 Vaccination 09/07/2019, 10/08/2019   PFIZER(Purple Top)SARS-COV-2 Vaccination 04/10/2020   PPD Test 03/04/2017    Pneumococcal Conjugate-13 05/31/2016   Pneumococcal Polysaccharide-23 09/09/2014, 10/20/2020   Td 02/14/2009   Td (Adult),5 Lf Tetanus Toxid, Preservative Free 02/14/2009   Tdap 06/29/2019   Zoster Recombinant(Shingrix) 12/25/2022    Screening Tests Health Maintenance  Topic Date Due   Diabetic kidney evaluation - Urine ACR  Never done   Lung Cancer Screening  Never done   Zoster Vaccines- Shingrix (2 of 2) 02/19/2023   OPHTHALMOLOGY EXAM  04/24/2023   HEMOGLOBIN A1C  11/21/2023   FOOT EXAM  11/22/2023   Influenza Vaccine  01/09/2024   COVID-19 Vaccine (4 - 2025-26 season) 02/09/2024   Diabetic kidney evaluation - eGFR measurement  05/22/2024   Mammogram  02/26/2025   Medicare Annual Wellness (AWV)  04/02/2025   DTaP/Tdap/Td (4 - Td or Tdap) 06/28/2029   Colonoscopy  01/30/2031   Pneumococcal Vaccine: 50+ Years  Completed   DEXA SCAN  Completed   Hepatitis C Screening  Completed   Meningococcal B Vaccine  Aged Out    Health Maintenance Items Addressed: Referral sent for Low Dose Chest CT (smoker/hx smoking), Diabetic  Foot Exam recommended w/Dr Pasco Ovens  Additional Screening:  Vision Screening: Recommended annual ophthalmology exams for early detection of glaucoma and other disorders of the eye. Is the patient up to date with their annual eye exam?  No  Who is the provider or what is the name of the office in which the patient attends annual eye exams? Patient plans to schedule an appt by 05/2024  Dental Screening: Recommended annual dental exams for proper oral hygiene  Community Resource Referral / Chronic Care Management: CRR required this visit?  No   CCM required this visit?  No   Plan:    I have personally reviewed and noted the following in the patient's chart:   Medical and social history Use of alcohol, tobacco or illicit drugs  Current medications and supplements including opioid prescriptions. Patient is not currently taking opioid  prescriptions. Functional ability and status Nutritional status Physical activity Advanced directives List of other physicians Hospitalizations, surgeries, and ER visits in previous 12 months Vitals Screenings to include cognitive, depression, and falls Referrals and appointments  In addition, I have reviewed and discussed with patient certain preventive protocols, quality metrics, and best practice recommendations. A written personalized care plan for preventive services as well as general preventive health recommendations were provided to patient.   Verdie CHRISTELLA Saba, CMA   04/02/2024   After Visit Summary: (MyChart) Due to this being a telephonic visit, the after visit summary with patients personalized plan was offered to patient via MyChart   Notes: Scheduled a Physical/Diabetes f/u w/PCP for 06/04/2024.

## 2024-04-16 ENCOUNTER — Encounter: Payer: Self-pay | Admitting: Pharmacist

## 2024-04-16 NOTE — Progress Notes (Signed)
 Pharmacy Quality Measure Review  This patient is appearing on a report for being at risk of failing the Glycemic Status Assessment in Diabetes and Controlling Blood Pressure measure this calendar year.   Last documented BP 110/64 on 05/23/23  Last documented A1c or GMI 6.8% on 05/23/23  PCP appt scheduled for 06/04/24. Left note on appt that patient needs A1c and UACR checked. No further action needed.  Darrelyn Drum, PharmD, BCPS, CPP Clinical Pharmacist Practitioner [facility] [phone]

## 2024-04-23 ENCOUNTER — Encounter: Payer: Self-pay | Admitting: Podiatry

## 2024-04-23 ENCOUNTER — Ambulatory Visit: Admitting: Podiatry

## 2024-04-23 DIAGNOSIS — E119 Type 2 diabetes mellitus without complications: Secondary | ICD-10-CM

## 2024-04-23 DIAGNOSIS — M7662 Achilles tendinitis, left leg: Secondary | ICD-10-CM

## 2024-04-23 DIAGNOSIS — B351 Tinea unguium: Secondary | ICD-10-CM | POA: Diagnosis not present

## 2024-04-26 NOTE — Progress Notes (Signed)
 Subjective:   Patient ID: Erica Mills, female   DOB: 68 y.o.   MRN: [id number]   HPI Patient states she is improved with the left ankle and left Achilles and also needs diabetic examination   ROS      Objective:  Physical Exam  I checked thoroughly neurovascular and found it to be intact with good pulses and good circulatory status currently with patient found to have reduced inflammation posterior heel left still mildly tender but much better than previously.  Patient is still wearing the boot part-time but has started to wear shoe gear currently     Assessment:  Diabetes which appears to be stable as far as her foot structure goes along with Achilles tendinitis left improving     Plan:  H&P reviewed both conditions the importance of daily inspections of her feet and good control of her A1c and eye for the Achilles recommended gradual reduction of the boot stretching exercises ice therapy and heel lift and not wearing any type of flat shoes.  Patient to be seen as needed

## 2024-05-16 ENCOUNTER — Other Ambulatory Visit: Payer: Self-pay | Admitting: Internal Medicine

## 2024-06-04 ENCOUNTER — Encounter: Admitting: Internal Medicine

## 2024-06-04 NOTE — Progress Notes (Signed)
 Erica Mills                                          MRN: [id number]   06/04/2024   The [facility] Quality Team Specialist reviewed this patient medical record for the purposes of chart review for care gap closure. The following were reviewed: chart review for care gap closure-controlling blood pressure and glycemic status assessment.    [facility] Quality Team

## 2024-06-11 ENCOUNTER — Ambulatory Visit: Payer: Self-pay | Admitting: Internal Medicine

## 2024-06-11 ENCOUNTER — Encounter: Payer: Self-pay | Admitting: Internal Medicine

## 2024-06-11 ENCOUNTER — Ambulatory Visit: Admitting: Internal Medicine

## 2024-06-11 VITALS — BP 142/80 | HR 82 | Temp 98.6°F | Ht 63.0 in | Wt 176.0 lb

## 2024-06-11 DIAGNOSIS — I10 Essential (primary) hypertension: Secondary | ICD-10-CM | POA: Diagnosis not present

## 2024-06-11 DIAGNOSIS — E890 Postprocedural hypothyroidism: Secondary | ICD-10-CM | POA: Diagnosis not present

## 2024-06-11 DIAGNOSIS — E1122 Type 2 diabetes mellitus with diabetic chronic kidney disease: Secondary | ICD-10-CM

## 2024-06-11 DIAGNOSIS — N1831 Chronic kidney disease, stage 3a: Secondary | ICD-10-CM

## 2024-06-11 DIAGNOSIS — Z7984 Long term (current) use of oral hypoglycemic drugs: Secondary | ICD-10-CM

## 2024-06-11 DIAGNOSIS — J309 Allergic rhinitis, unspecified: Secondary | ICD-10-CM

## 2024-06-11 DIAGNOSIS — Z0001 Encounter for general adult medical examination with abnormal findings: Secondary | ICD-10-CM

## 2024-06-11 DIAGNOSIS — E559 Vitamin D deficiency, unspecified: Secondary | ICD-10-CM | POA: Diagnosis not present

## 2024-06-11 DIAGNOSIS — Z23 Encounter for immunization: Secondary | ICD-10-CM | POA: Diagnosis not present

## 2024-06-11 DIAGNOSIS — E538 Deficiency of other specified B group vitamins: Secondary | ICD-10-CM

## 2024-06-11 DIAGNOSIS — J0110 Acute frontal sinusitis, unspecified: Secondary | ICD-10-CM

## 2024-06-11 DIAGNOSIS — E7800 Pure hypercholesterolemia, unspecified: Secondary | ICD-10-CM | POA: Diagnosis not present

## 2024-06-11 LAB — HEPATIC FUNCTION PANEL
ALT: 19 U/L (ref 3–35)
AST: 20 U/L (ref 5–37)
Albumin: 4.4 g/dL (ref 3.5–5.2)
Alkaline Phosphatase: 70 U/L (ref 39–117)
Bilirubin, Direct: 0.1 mg/dL (ref 0.1–0.3)
Total Bilirubin: 0.5 mg/dL (ref 0.2–1.2)
Total Protein: 7.5 g/dL (ref 6.0–8.3)

## 2024-06-11 LAB — MICROALBUMIN / CREATININE URINE RATIO
Creatinine,U: 98.9 mg/dL
Microalb Creat Ratio: 51.9 mg/g — ABNORMAL HIGH (ref 0.0–30.0)
Microalb, Ur: 5.1 mg/dL — ABNORMAL HIGH (ref 0.7–1.9)

## 2024-06-11 LAB — LIPID PANEL
Cholesterol: 168 mg/dL (ref 28–200)
HDL: 69.5 mg/dL
LDL Cholesterol: 87 mg/dL (ref 10–99)
NonHDL: 98.98
Total CHOL/HDL Ratio: 2
Triglycerides: 58 mg/dL (ref 10.0–149.0)
VLDL: 11.6 mg/dL (ref 0.0–40.0)

## 2024-06-11 LAB — CBC WITH DIFFERENTIAL/PLATELET
Basophils Absolute: 0.1 K/uL (ref 0.0–0.1)
Basophils Relative: 0.8 % (ref 0.0–3.0)
Eosinophils Absolute: 0.2 K/uL (ref 0.0–0.7)
Eosinophils Relative: 2.3 % (ref 0.0–5.0)
HCT: 40.5 % (ref 36.0–46.0)
Hemoglobin: 13.6 g/dL (ref 12.0–15.0)
Lymphocytes Relative: 28.2 % (ref 12.0–46.0)
Lymphs Abs: 2.2 K/uL (ref 0.7–4.0)
MCHC: 33.5 g/dL (ref 30.0–36.0)
MCV: 87.5 fl (ref 78.0–100.0)
Monocytes Absolute: 0.6 K/uL (ref 0.1–1.0)
Monocytes Relative: 8 % (ref 3.0–12.0)
Neutro Abs: 4.7 K/uL (ref 1.4–7.7)
Neutrophils Relative %: 60.7 % (ref 43.0–77.0)
Platelets: 223 K/uL (ref 150.0–400.0)
RBC: 4.63 Mil/uL (ref 3.87–5.11)
RDW: 15.9 % — ABNORMAL HIGH (ref 11.5–15.5)
WBC: 7.8 K/uL (ref 4.0–10.5)

## 2024-06-11 LAB — URINALYSIS, ROUTINE W REFLEX MICROSCOPIC
Bilirubin Urine: NEGATIVE
Hgb urine dipstick: NEGATIVE
Ketones, ur: NEGATIVE
Leukocytes,Ua: NEGATIVE
Nitrite: NEGATIVE
Specific Gravity, Urine: 1.015 (ref 1.000–1.030)
Total Protein, Urine: NEGATIVE
Urine Glucose: NEGATIVE
Urobilinogen, UA: 0.2 (ref 0.0–1.0)
pH: 6.5 (ref 5.0–8.0)

## 2024-06-11 LAB — BASIC METABOLIC PANEL WITH GFR
BUN: 14 mg/dL (ref 6–23)
CO2: 28 meq/L (ref 19–32)
Calcium: 9.4 mg/dL (ref 8.4–10.5)
Chloride: 106 meq/L (ref 96–112)
Creatinine, Ser: 0.8 mg/dL (ref 0.40–1.20)
GFR: 75.44 mL/min
Glucose, Bld: 111 mg/dL — ABNORMAL HIGH (ref 70–99)
Potassium: 3.9 meq/L (ref 3.5–5.1)
Sodium: 142 meq/L (ref 135–145)

## 2024-06-11 LAB — VITAMIN B12: Vitamin B-12: 543 pg/mL (ref 211–911)

## 2024-06-11 LAB — TSH: TSH: 5.86 u[IU]/mL — ABNORMAL HIGH (ref 0.35–5.50)

## 2024-06-11 LAB — HEMOGLOBIN A1C: Hgb A1c MFr Bld: 6.5 % (ref 4.6–6.5)

## 2024-06-11 LAB — VITAMIN D 25 HYDROXY (VIT D DEFICIENCY, FRACTURES): VITD: 36.42 ng/mL (ref 30.00–100.00)

## 2024-06-11 MED ORDER — FLUTICASONE PROPIONATE 50 MCG/ACT NA SUSP: 2.0000 | Freq: Every day | NASAL | 6 refills | Status: AC

## 2024-06-11 NOTE — Assessment & Plan Note (Signed)
 [id number]  Lab Results  Component Value Date   HGBA1C 6.8 (H) 05/23/2023   Stable, pt to continue current medical treatment metformin  ER 500 mg 1 qd

## 2024-06-11 NOTE — Progress Notes (Signed)
 Patient ID: Erica Mills, female   DOB: 1955/10/19, 69 y.o.   MRN: [id number]         Chief Complaint:: wellness exam and Annual Exam (Would like some nasal spray sent to the pharmacy )  ,allergies, htn, dm with ckd3a, low vit d and b12       HPI:  Erica Mills is a 69 y.o. female here for wellness exam; due for eye exam with Dr Octavia but not yet appt, due for flu shot, for shingrix at pharmacy, o/w up to date                        Also trying to lose wt, but hard to exercise due to bilateral plantar fasciitis with cortisone to left heel.  Pt denies chest pain, increased sob or doe, wheezing, orthopnea, PND, increased LE swelling, palpitations, dizziness or syncope.   Pt denies polydipsia, polyuria, or new focal neuro s/s.    Pt denies fever, wt loss, night sweats, loss of appetite, or other constitutional symptoms  BP has been controlled at home, not yet takens meds this am.  Does have several wks ongoing nasal allergy symptoms with clearish congestion, itch and sneezing, without fever, pain, ST, cough, swelling or wheezing.   Wt Readings from Last 3 Encounters:  06/11/24 176 lb (79.8 kg)  04/02/24 172 lb (78 kg)  02/06/24 183 lb (83 kg)   BP Readings from Last 3 Encounters:  06/11/24 (!) 142/80  05/23/23 110/64  11/22/22 128/80   Immunization History  Administered Date(s) Administered   Fluad Quad(high Dose 65+) 07/14/2020, 05/10/2022   Fluad Trivalent(High Dose 65+) 05/23/2023   Influenza Split 04/02/2013, 02/25/2019   Influenza Whole 04/13/2007   Influenza, Quadrivalent, Recombinant, Inj, Pf 02/22/2019   Influenza,inj,Quad PF,6+ Mos 03/05/2014, 03/24/2015, 05/17/2016, 03/08/2017   Influenza-Unspecified 03/05/2014, 03/24/2015, 05/17/2016, 02/25/2019   Moderna Sars-Covid-2 Vaccination 09/07/2019, 10/08/2019   PFIZER(Purple Top)SARS-COV-2 Vaccination 04/10/2020   PPD Test 03/04/2017   Pneumococcal Conjugate-13 05/31/2016   Pneumococcal Polysaccharide-23 09/09/2014, 10/20/2020    Td 02/14/2009   Td (Adult),5 Lf Tetanus Toxid, Preservative Free 02/14/2009   Tdap 06/29/2019   Zoster Recombinant(Shingrix) 12/25/2022   Health Maintenance Due  Topic Date Due   Diabetic kidney evaluation - Urine ACR  Never done   Zoster Vaccines- Shingrix (2 of 2) 02/19/2023   OPHTHALMOLOGY EXAM  04/24/2023   HEMOGLOBIN A1C  11/21/2023   Influenza Vaccine  01/09/2024   COVID-19 Vaccine (4 - 2025-26 season) 02/09/2024   Diabetic kidney evaluation - eGFR measurement  05/22/2024      Past Medical History:  Diagnosis Date   ACHILLES TENDINITIS 10/06/2009   ALLERGIC RHINITIS 05/19/2007   CARPAL TUNNEL SYNDROME, BILATERAL 05/19/2007   Cyst of breast, right, solitary 07/11/2012   Diabetes mellitus without complication (HCC)    on meds   ELBOW PAIN, RIGHT 04/21/2008   Fibroids    GERD (gastroesophageal reflux disease)    with certain foods/OTC meds   GLUCOSE INTOLERANCE 02/14/2009   H/O menorrhagia    HYPERLIPIDEMIA 05/19/2007   on meds   HYPERTENSION 05/19/2007   on meds   HYPERTHYROIDISM 03/10/2009   on meds   Lateral epicondylitis  of elbow 04/21/2008   Seasonal allergies    SINUSITIS- ACUTE-NOS 05/11/2010   SMOKER 05/19/2007   Past Surgical History:  Procedure Laterality Date   BREAST BIOPSY Left 03/12/2024   US  LT BREAST BX W LOC DEV 1ST LESION IMG BX SPEC US  GUIDE 03/12/2024  [facility] MAMMOGRAPHY   CESAREAN SECTION     COLONOSCOPY  2012   CG-F/V-movi (exc)-tics-10 yr recall   TONSILLECTOMY      reports that she quit smoking about 12 years ago. Her smoking use included cigarettes. She has never used smokeless tobacco. She reports that she does not drink alcohol and does not use drugs. family history includes Cancer in her paternal uncle; Diabetes in her father, mother, and sister; Hypertension in her father and mother. Allergies[1] Medications Ordered Prior to Encounter[2]      ROS:  All others reviewed and negative.  Objective        PE:  BP (!) 142/80 (BP  Location: Right Arm, Patient Position: Sitting, Cuff Size: Normal)   Pulse 82   Temp 98.6 F (37 C) (Oral)   Ht 5' 3 (1.6 m)   Wt 176 lb (79.8 kg)   SpO2 98%   BMI 31.18 kg/m                 Constitutional: Pt appears in NAD               HENT: Head: NCAT.                Right Ear: External ear normal.                 Left Ear: External ear normal. Bilat tm's with mild erythema.  Max sinus areas non tender.  Pharynx with mild erythema, no exudate               Eyes: . Pupils are equal, round, and reactive to light. Conjunctivae and EOM are normal               Nose: without d/c or deformity               Neck: Neck supple. Gross normal ROM               Cardiovascular: Normal rate and regular rhythm.                 Pulmonary/Chest: Effort normal and breath sounds without rales or wheezing.                Abd:  Soft, NT, ND, + BS, no organomegaly               Neurological: Pt is alert. At baseline orientation, motor grossly intact               Skin: Skin is warm. No rashes, no other new lesions, LE edema - none               Psychiatric: Pt behavior is normal without agitation   Micro: none  Cardiac tracings I have personally interpreted today:  none  Pertinent Radiological findings (summarize): none   Lab Results  Component Value Date   WBC 7.1 11/08/2022   HGB 13.4 11/08/2022   HCT 41.4 11/08/2022   PLT 207.0 11/08/2022   GLUCOSE 105 (H) 05/23/2023   CHOL 157 05/23/2023   TRIG 62.0 05/23/2023   HDL 62.60 05/23/2023   LDLDIRECT 153.3 05/19/2007   LDLCALC 82 05/23/2023   ALT 21 05/23/2023   AST 21 05/23/2023   NA 142 05/23/2023   K 3.8 05/23/2023   CL 106 05/23/2023   CREATININE 0.81 05/23/2023   BUN 12 05/23/2023   CO2 28 05/23/2023   TSH 2.36 05/23/2023   HGBA1C 6.8 (H) 05/23/2023   Assessment/Plan:  Erica Mills is a 69 y.o. Black or African American [2] female with  has a past medical history of ACHILLES TENDINITIS (10/06/2009), ALLERGIC RHINITIS  (05/19/2007), CARPAL TUNNEL SYNDROME, BILATERAL (05/19/2007), Cyst of breast, right, solitary (07/11/2012), Diabetes mellitus without complication (HCC), ELBOW PAIN, RIGHT (04/21/2008), Fibroids, GERD (gastroesophageal reflux disease), GLUCOSE INTOLERANCE (02/14/2009), H/O menorrhagia, HYPERLIPIDEMIA (05/19/2007), HYPERTENSION (05/19/2007), HYPERTHYROIDISM (03/10/2009), Lateral epicondylitis  of elbow (04/21/2008), Seasonal allergies, SINUSITIS- ACUTE-NOS (05/11/2010), and SMOKER (05/19/2007).  Encounter for well adult exam with abnormal findings Age and sex appropriate education and counseling updated with regular exercise and diet Referrals for preventative services - due for eye exam referral Immunizations addressed - for flu shot today, for shingrx at pharmacy Smoking counseling  - none needed Evidence for depression or other mood disorder - none significant Most recent labs reviewed. I have personally reviewed and have noted: 1) the patient's medical and social history 2) The patient's current medications and supplements 3) The patient's height, weight, and BMI have been recorded in the chart   Vitamin D  deficiency Last vitamin D  Lab Results  Component Value Date   VD25OH 38.38 05/23/2023   Low, to start oral replacement   Hypothyroidism Lab Results  Component Value Date   TSH 2.36 05/23/2023   Stable, pt to continue levothyroxine  50 mcg every day, and f/u lab today   Hyperlipidemia Lab Results  Component Value Date   LDLCALC 82 05/23/2023   Uncontrolled, has been working on diet, pt to continue current statin lipitor 80 mg every day and zetia  10 mg every day, and f/u lab today   Essential hypertension BP Readings from Last 3 Encounters:  06/11/24 (!) 142/80  05/23/23 110/64  11/22/22 128/80   Uncontrolled, but pt states controlled at home and has not taken med this am yet, pt to continue medical treatment norvasc  10 qd   Diabetes mellitus with chronic kidney  disease (HCC) Ckd3a  Lab Results  Component Value Date   HGBA1C 6.8 (H) 05/23/2023   Stable, pt to continue current medical treatment metformin  ER 500 mg 1 qd   B12 deficiency Lab Results  Component Value Date   VITAMINB12 >1500 (H) 11/08/2022   Stable, cont oral replacement - b12 1000 mcg qd   Allergic rhinitis With mild worsening, for flonase  restart asd,  to f/u any worsening symptoms or concerns  Followup: Return in about 6 months (around 12/09/2024).  Lynwood Rush, MD 06/11/2024 9:49 AM [facility]     [1]  Allergies Allergen Reactions   Crestor  [Rosuvastatin ] Other (See Comments)    myalgia   Sulfonamide Derivatives     REACTION: rash  [2]  Current Outpatient Medications on File Prior to Visit  Medication Sig Dispense Refill   amLODipine  (NORVASC ) 10 MG tablet TAKE 1 TABLET BY MOUTH EVERY DAY. PATIENT DUE FOR PHYSICAL WITH PCP PRIOR TO FUTURE REFILLS 90 tablet 0   aspirin 81 MG tablet Take 81 mg by mouth daily.     Aspirin-Calcium  Carbonate (BAYER WOMENS) 81-777 MG TABS Take 81 mg by mouth daily at 2 am.     atorvastatin  (LIPITOR) 80 MG tablet TAKE 1 TABLET BY MOUTH EVERY DAY 90 tablet 3   benzonatate  (TESSALON ) 100 MG capsule Take 1 capsule (100 mg total) by mouth 3 (three) times daily as needed for cough. 30 capsule 0   BIOTIN PO Take 1 tablet by mouth daily at 6 (six) AM.     cetirizine  (ZYRTEC )  10 MG tablet Take 1 tablet (10 mg total) by mouth daily. 30 tablet 11   Cholecalciferol (VITAMIN D3 PO) Take 1 tablet by mouth daily at 6 (six) AM.     diclofenac  sodium (VOLTAREN ) 1 % GEL Apply 2 g topically 4 (four) times daily as needed. 200 g 3   ezetimibe  (ZETIA ) 10 MG tablet TAKE 1 TABLET BY MOUTH EVERY DAY 90 tablet 3   gabapentin  (NEURONTIN ) 100 MG capsule TAKE 1 CAPSULE BY MOUTH AT BEDTIME. 30 capsule 2   levothyroxine  (SYNTHROID ) 50 MCG tablet TAKE 1 TABLET BY MOUTH EVERY DAY 90 tablet 3    lidocaine  (LIDODERM ) 5 % Place 1 patch onto the skin daily. Remove & Discard patch within 12 hours or as directed by MD 40 patch 2   metFORMIN  (GLUCOPHAGE -XR) 500 MG 24 hr tablet TAKE 1 TABLET BY MOUTH EVERY DAY WITH BREAKFAST 90 tablet 3   Multiple Vitamins-Minerals (ZINC PO) Take 1 tablet by mouth daily at 6 (six) AM.     neomycin -polymyxin-hydrocortisone (CORTISPORIN) OTIC solution Place 4 drops into both ears 4 (four) times daily. 10 mL 2   sertraline  (ZOLOFT ) 50 MG tablet TAKE 1 TABLET BY MOUTH EVERY DAY 90 tablet 2   triamcinolone  cream (KENALOG ) 0.1 % Apply 1 application topically 4 (four) times daily. As needed for rash 30 g 0   No current facility-administered medications on file prior to visit.

## 2024-06-11 NOTE — Addendum Note (Signed)
 Addended by: WONDA BETTER on: 06/11/2024 11:22 AM   Modules accepted: Orders

## 2024-06-11 NOTE — Assessment & Plan Note (Signed)
 With mild worsening, for flonase  restart asd,  to f/u any worsening symptoms or concerns

## 2024-06-11 NOTE — Assessment & Plan Note (Signed)
 Lab Results  Component Value Date   LDLCALC 82 05/23/2023   Uncontrolled, has been working on diet, pt to continue current statin lipitor 80 mg every day and zetia  10 mg every day, and f/u lab today

## 2024-06-11 NOTE — Progress Notes (Signed)
 Erica Mills                                          MRN: [id number]   06/11/2024   The [facility] Quality Team Specialist reviewed this patient medical record for the purposes of chart review for care gap closure. The following were reviewed: chart review for care gap closure-kidney health evaluation for diabetes:eGFR  and uACR.    [facility] Quality Team

## 2024-06-11 NOTE — Progress Notes (Signed)
 The test results show that your current treatment is OK, as the tests are stable.  Please continue the same plan.  There is no other need for change of treatment or further evaluation based on these results, at this time.  thanks

## 2024-06-11 NOTE — Assessment & Plan Note (Signed)
 Age and sex appropriate education and counseling updated with regular exercise and diet Referrals for preventative services - due for eye exam referral Immunizations addressed - for flu shot today, for shingrx at pharmacy Smoking counseling  - none needed Evidence for depression or other mood disorder - none significant Most recent labs reviewed. I have personally reviewed and have noted: 1) the patient's medical and social history 2) The patient's current medications and supplements 3) The patient's height, weight, and BMI have been recorded in the chart

## 2024-06-11 NOTE — Assessment & Plan Note (Signed)
 Lab Results  Component Value Date   TSH 2.36 05/23/2023   Stable, pt to continue levothyroxine  50 mcg every day, and f/u lab today

## 2024-06-11 NOTE — Patient Instructions (Signed)
 You had the flu shot today  Please continue all other medications as before, and refills have been done if requested.  Please have the pharmacy call with any other refills you may need.  Please continue your efforts at being more active, low cholesterol diet, and weight control.  You are otherwise up to date with prevention measures today.  Please keep your appointments with your specialists as you may have planned  You will be contacted regarding the referral for: Eye doctor - Dr Octavia  Please go to the LAB at the blood drawing area for the tests to be done  You will be contacted by phone if any changes need to be made immediately.  Otherwise, you will receive a letter about your results with an explanation, but please check with MyChart first.  Please make an Appointment to return in 6 months, or sooner if needed

## 2024-06-11 NOTE — Assessment & Plan Note (Signed)
 BP Readings from Last 3 Encounters:  06/11/24 (!) 142/80  05/23/23 110/64  11/22/22 128/80   Uncontrolled, but pt states controlled at home and has not taken med this am yet, pt to continue medical treatment norvasc  10 qd

## 2024-06-11 NOTE — Assessment & Plan Note (Signed)
 Last vitamin D Lab Results  Component Value Date   VD25OH 38.38 05/23/2023   Low, to start oral replacement

## 2024-06-11 NOTE — Assessment & Plan Note (Signed)
Lab Results  Component Value Date   VITAMINB12 >1500 (H) 11/08/2022   Stable, cont oral replacement - b12 1000 mcg qd

## 2024-06-16 ENCOUNTER — Other Ambulatory Visit: Payer: Self-pay | Admitting: Internal Medicine

## 2024-06-16 ENCOUNTER — Other Ambulatory Visit: Payer: Self-pay

## 2024-06-18 ENCOUNTER — Ambulatory Visit

## 2024-06-25 ENCOUNTER — Telehealth: Payer: Self-pay

## 2024-06-25 ENCOUNTER — Ambulatory Visit: Admitting: Internal Medicine

## 2024-06-25 ENCOUNTER — Ambulatory Visit

## 2024-06-25 ENCOUNTER — Encounter: Payer: Self-pay | Admitting: Internal Medicine

## 2024-06-25 VITALS — BP 140/78 | HR 85 | Temp 98.2°F | Ht 63.0 in | Wt 177.0 lb

## 2024-06-25 DIAGNOSIS — E890 Postprocedural hypothyroidism: Secondary | ICD-10-CM

## 2024-06-25 DIAGNOSIS — E1122 Type 2 diabetes mellitus with diabetic chronic kidney disease: Secondary | ICD-10-CM | POA: Diagnosis not present

## 2024-06-25 DIAGNOSIS — N1831 Chronic kidney disease, stage 3a: Secondary | ICD-10-CM | POA: Diagnosis not present

## 2024-06-25 DIAGNOSIS — E7800 Pure hypercholesterolemia, unspecified: Secondary | ICD-10-CM | POA: Diagnosis not present

## 2024-06-25 DIAGNOSIS — E559 Vitamin D deficiency, unspecified: Secondary | ICD-10-CM

## 2024-06-25 DIAGNOSIS — I10 Essential (primary) hypertension: Secondary | ICD-10-CM

## 2024-06-25 DIAGNOSIS — Z7984 Long term (current) use of oral hypoglycemic drugs: Secondary | ICD-10-CM

## 2024-06-25 DIAGNOSIS — M7661 Achilles tendinitis, right leg: Secondary | ICD-10-CM

## 2024-06-25 MED ORDER — PREDNISONE 10 MG PO TABS: ORAL_TABLET | ORAL | 0 refills | Status: AC

## 2024-06-25 NOTE — Telephone Encounter (Signed)
 Ok sorry, this is now done thanks

## 2024-06-25 NOTE — Patient Instructions (Signed)
 Please take all new medication as prescribed - the prednisone  which should help the heel and probably the ears too  Please continue all other medications as before, and refills have been done if requested.  Please have the pharmacy call with any other refills you may need.  Please keep your appointments with your specialists as you may have planned  Please make an Appointment to return in 6 months, or sooner if needed

## 2024-06-25 NOTE — Assessment & Plan Note (Signed)
 With Ckd3a  Lab Results  Component Value Date   HGBA1C 6.5 06/11/2024   Stable, pt to continue current medical treatment metformin  ER 500 mg - 1 qd

## 2024-06-25 NOTE — Assessment & Plan Note (Signed)
 Mild to mod, for prednisone taper, to f/u any worsening symptoms or concerns

## 2024-06-25 NOTE — Assessment & Plan Note (Signed)
 Lab Results  Component Value Date   LDLCALC 87 06/11/2024   Mild uncontrolled, pt declines add zetia , pt to continue current statin lipitor 80 mg every day and lower chol diet

## 2024-06-25 NOTE — Addendum Note (Signed)
 Addended by: NORLEEN LYNWOOD ORN on: 06/25/2024 04:39 PM   Modules accepted: Orders

## 2024-06-25 NOTE — Telephone Encounter (Signed)
 Copied from CRM [id number]. Topic: Clinical - Prescription Issue >> Jun 25, 2024  4:27 PM Chasity T wrote: Reason for CRM: Patient was seen in office today and was still waiting on her medication to be sent to pharmacy. The prescription prednisone  has not been sent in yet. Please advise patient when it has been sent.

## 2024-06-25 NOTE — Assessment & Plan Note (Signed)
 Last vitamin D  Lab Results  Component Value Date   VD25OH 36.42 06/11/2024   Low, to start oral replacement

## 2024-06-25 NOTE — Progress Notes (Signed)
 Patient ID: Erica Mills, female   DOB: August 02, 1955, 69 y.o.   MRN: [id number]        Chief Complaint: follow up HTN, HLD, DM, low thyroid , low vit d       HPI:  Erica Mills is a 69 y.o. female here overall doing ok, Pt denies chest pain, increased sob or doe, wheezing, orthopnea, PND, increased LE swelling, palpitations, dizziness or syncope.   Pt denies polydipsia, polyuria, or new focal neuro s/s.    Pt denies fever, wt loss, night sweats, loss of appetite, or other constitutional symptoms  Denies hyper or hypo thyroid  symptoms such as voice, skin or hair change. Does have significant right heel achilles insertion site tendonitis, had cortisone recently and seemed to help, then became worse again.        Wt Readings from Last 3 Encounters:  06/25/24 177 lb (80.3 kg)  06/11/24 176 lb (79.8 kg)  04/02/24 172 lb (78 kg)   BP Readings from Last 3 Encounters:  06/25/24 (!) 140/78  06/11/24 (!) 142/80  05/23/23 110/64         Past Medical History:  Diagnosis Date   ACHILLES TENDINITIS 10/06/2009   ALLERGIC RHINITIS 05/19/2007   CARPAL TUNNEL SYNDROME, BILATERAL 05/19/2007   Cyst of breast, right, solitary 07/11/2012   Diabetes mellitus without complication (HCC)    on meds   ELBOW PAIN, RIGHT 04/21/2008   Fibroids    GERD (gastroesophageal reflux disease)    with certain foods/OTC meds   GLUCOSE INTOLERANCE 02/14/2009   H/O menorrhagia    HYPERLIPIDEMIA 05/19/2007   on meds   HYPERTENSION 05/19/2007   on meds   HYPERTHYROIDISM 03/10/2009   on meds   Lateral epicondylitis  of elbow 04/21/2008   Seasonal allergies    SINUSITIS- ACUTE-NOS 05/11/2010   SMOKER 05/19/2007   Past Surgical History:  Procedure Laterality Date   BREAST BIOPSY Left 03/12/2024   US  LT BREAST BX W LOC DEV 1ST LESION IMG BX SPEC US  GUIDE 03/12/2024 [facility] MAMMOGRAPHY   CESAREAN SECTION     COLONOSCOPY  2012   CG-F/V-movi (exc)-tics-10 yr recall   TONSILLECTOMY      reports that she quit smoking about  12 years ago. Her smoking use included cigarettes. She has never used smokeless tobacco. She reports that she does not drink alcohol and does not use drugs. family history includes Cancer in her paternal uncle; Diabetes in her father, mother, and sister; Hypertension in her father and mother. Allergies[1] Medications Ordered Prior to Encounter[2]      ROS:  All others reviewed and negative.  Objective        PE:  BP (!) 140/78 (BP Location: Left Arm, Patient Position: Sitting, Cuff Size: Normal)   Pulse 85   Temp 98.2 F (36.8 C) (Oral)   Ht 5' 3 (1.6 m)   Wt 177 lb (80.3 kg)   SpO2 99%   BMI 31.35 kg/m                 Constitutional: Pt appears in NAD               HENT: Head: NCAT.                Right Ear: External ear normal.                 Left Ear: External ear normal.  Eyes: . Pupils are equal, round, and reactive to light. Conjunctivae and EOM are normal               Nose: without d/c or deformity               Neck: Neck supple. Gross normal ROM               Cardiovascular: Normal rate and regular rhythm.                 Pulmonary/Chest: Effort normal and breath sounds without rales or wheezing.                Abd:  Soft, NT, ND, + BS, no organomegaly               Neurological: Pt is alert. At baseline orientation, motor grossly intact               Skin: Skin is warm. No rashes, no other new lesions, LE edema - none               Psychiatric: Pt behavior is normal without agitation   Micro: none  Cardiac tracings I have personally interpreted today:  none  Pertinent Radiological findings (summarize): none   Lab Results  Component Value Date   WBC 7.8 06/11/2024   HGB 13.6 06/11/2024   HCT 40.5 06/11/2024   PLT 223.0 06/11/2024   GLUCOSE 111 (H) 06/11/2024   CHOL 168 06/11/2024   TRIG 58.0 06/11/2024   HDL 69.50 06/11/2024   LDLDIRECT 153.3 05/19/2007   LDLCALC 87 06/11/2024   ALT 19 06/11/2024   AST 20 06/11/2024   NA 142 06/11/2024    K 3.9 06/11/2024   CL 106 06/11/2024   CREATININE 0.80 06/11/2024   BUN 14 06/11/2024   CO2 28 06/11/2024   TSH 5.86 (H) 06/11/2024   HGBA1C 6.5 06/11/2024   MICROALBUR 5.1 (H) 06/11/2024   Assessment/Plan:  Erica Mills is a 69 y.o. Black or African American [2] female with  has a past medical history of ACHILLES TENDINITIS (10/06/2009), ALLERGIC RHINITIS (05/19/2007), CARPAL TUNNEL SYNDROME, BILATERAL (05/19/2007), Cyst of breast, right, solitary (07/11/2012), Diabetes mellitus without complication (HCC), ELBOW PAIN, RIGHT (04/21/2008), Fibroids, GERD (gastroesophageal reflux disease), GLUCOSE INTOLERANCE (02/14/2009), H/O menorrhagia, HYPERLIPIDEMIA (05/19/2007), HYPERTENSION (05/19/2007), HYPERTHYROIDISM (03/10/2009), Lateral epicondylitis  of elbow (04/21/2008), Seasonal allergies, SINUSITIS- ACUTE-NOS (05/11/2010), and SMOKER (05/19/2007).  Diabetes mellitus with chronic kidney disease (HCC) With Ckd3a  Lab Results  Component Value Date   HGBA1C 6.5 06/11/2024   Stable, pt to continue current medical treatment metformin  ER 500 mg - 1 qd   Vitamin D  deficiency Last vitamin D  Lab Results  Component Value Date   VD25OH 36.42 06/11/2024   Low, to start oral replacement   Hypothyroidism Lab Results  Component Value Date   TSH 5.86 (H) 06/11/2024   This is felt to be stable as the elevation is trivial, pt to continue levothyroxine  50 mcg qd   Hyperlipidemia Lab Results  Component Value Date   LDLCALC 87 06/11/2024   Mild uncontrolled, pt declines add zetia , pt to continue current statin lipitor 80 mg every day and lower chol diet   Essential hypertension BP Readings from Last 3 Encounters:  06/25/24 (!) 140/78  06/11/24 (!) 142/80  05/23/23 110/64   Mild uncontrolled but pt states controlled at home, declines any changes, pt to continue medical treatment norvasc  10 qd   Right Achilles tendinitis Mild to mod,  for prednisone  taper,  to f/u any worsening  symptoms or concerns  Followup: Return in about 6 months (around 12/23/2024).  Lynwood Rush, MD 06/25/2024 12:05 PM [facility] [place] [facility] - [facility]     [1]  Allergies Allergen Reactions   Crestor  [Rosuvastatin ] Other (See Comments)    myalgia   Sulfonamide Derivatives     REACTION: rash  [2]  Current Outpatient Medications on File Prior to Visit  Medication Sig Dispense Refill   amLODipine  (NORVASC ) 10 MG tablet TAKE 1 TABLET BY MOUTH EVERY DAY. PATIENT DUE FOR PHYSICAL WITH PCP PRIOR TO FUTURE REFILLS 90 tablet 0   aspirin 81 MG tablet Take 81 mg by mouth daily.     Aspirin-Calcium  Carbonate (BAYER WOMENS) 81-777 MG TABS Take 81 mg by mouth daily at 2 am.     atorvastatin  (LIPITOR) 80 MG tablet TAKE 1 TABLET BY MOUTH EVERY DAY 90 tablet 3   benzonatate  (TESSALON ) 100 MG capsule Take 1 capsule (100 mg total) by mouth 3 (three) times daily as needed for cough. 30 capsule 0   BIOTIN PO Take 1 tablet by mouth daily at 6 (six) AM.     cetirizine  (ZYRTEC ) 10 MG tablet Take 1 tablet (10 mg total) by mouth daily. 30 tablet 11   Cholecalciferol (VITAMIN D3 PO) Take 1 tablet by mouth daily at 6 (six) AM.     diclofenac  sodium (VOLTAREN ) 1 % GEL Apply 2 g topically 4 (four) times daily as needed. 200 g 3   ezetimibe  (ZETIA ) 10 MG tablet TAKE 1 TABLET BY MOUTH EVERY DAY 90 tablet 3   fluticasone  (FLONASE ) 50 MCG/ACT nasal spray Place 2 sprays into both nostrils daily. 16 g 6   gabapentin  (NEURONTIN ) 100 MG capsule TAKE 1 CAPSULE BY MOUTH AT BEDTIME. 30 capsule 2   levothyroxine  (SYNTHROID ) 50 MCG tablet TAKE 1 TABLET BY MOUTH EVERY DAY 90 tablet 3   lidocaine  (LIDODERM ) 5 % Place 1 patch onto the skin daily. Remove & Discard patch within 12 hours or as directed by MD 40 patch 2   metFORMIN  (GLUCOPHAGE -XR) 500 MG 24 hr tablet TAKE 1 TABLET BY MOUTH EVERY DAY WITH BREAKFAST 90 tablet 3   Multiple Vitamins-Minerals (ZINC PO) Take 1 tablet by mouth  daily at 6 (six) AM.     neomycin -polymyxin-hydrocortisone (CORTISPORIN) OTIC solution Place 4 drops into both ears 4 (four) times daily. 10 mL 2   sertraline  (ZOLOFT ) 50 MG tablet TAKE 1 TABLET BY MOUTH EVERY DAY 90 tablet 2   triamcinolone  cream (KENALOG ) 0.1 % Apply 1 application topically 4 (four) times daily. As needed for rash 30 g 0   No current facility-administered medications on file prior to visit.

## 2024-06-25 NOTE — Assessment & Plan Note (Signed)
 BP Readings from Last 3 Encounters:  06/25/24 (!) 140/78  06/11/24 (!) 142/80  05/23/23 110/64   Mild uncontrolled but pt states controlled at home, declines any changes, pt to continue medical treatment norvasc  10 qd

## 2024-06-25 NOTE — Assessment & Plan Note (Signed)
 Lab Results  Component Value Date   TSH 5.86 (H) 06/11/2024   This is felt to be stable as the elevation is trivial, pt to continue levothyroxine  50 mcg qd

## 2024-07-07 NOTE — Progress Notes (Signed)
 Erica Mills                                          MRN: [id number]   07/07/2024   The [facility] Quality Team Specialist reviewed this patient medical record for the purposes of chart review for care gap closure. The following were reviewed: chart review for care gap closure-controlling blood pressure and glycemic status assessment.    [facility] Quality Team

## 2024-07-12 NOTE — Progress Notes (Signed)
 Erica Mills                                          MRN: [id number]   07/12/2024   The [facility] Quality Team Specialist reviewed this patient medical record for the purposes of chart review for care gap closure. The following were reviewed: chart review for care gap closure-controlling blood pressure, glycemic status assessment, and kidney health evaluation for diabetes:eGFR  and uACR.    [facility] Quality Team

## 2024-10-28 ENCOUNTER — Ambulatory Visit: Admitting: Family Medicine

## 2025-01-21 ENCOUNTER — Ambulatory Visit
